# Patient Record
Sex: Female | Born: 1935
Health system: Southern US, Community
[De-identification: ages and names within clinical notes are randomized; demographics above are authoritative.]

## PROBLEM LIST (undated history)

## (undated) DIAGNOSIS — M109 Gout, unspecified: Secondary | ICD-10-CM

## (undated) DIAGNOSIS — J40 Bronchitis, not specified as acute or chronic: Secondary | ICD-10-CM

## (undated) DIAGNOSIS — M1990 Unspecified osteoarthritis, unspecified site: Secondary | ICD-10-CM

## (undated) HISTORY — PX: SALPINGECTOMY: SHX328

---

## 1997-11-01 ENCOUNTER — Encounter: Admission: RE | Admit: 1997-11-01 | Discharge: 1997-11-01 | Payer: Self-pay | Admitting: Family Medicine

## 1997-11-27 ENCOUNTER — Encounter: Admission: RE | Admit: 1997-11-27 | Discharge: 1997-11-27 | Payer: Self-pay | Admitting: Family Medicine

## 1998-01-17 ENCOUNTER — Encounter: Admission: RE | Admit: 1998-01-17 | Discharge: 1998-01-17 | Payer: Self-pay | Admitting: Family Medicine

## 1998-05-17 ENCOUNTER — Encounter: Admission: RE | Admit: 1998-05-17 | Discharge: 1998-05-17 | Payer: Self-pay | Admitting: Family Medicine

## 1998-06-12 ENCOUNTER — Encounter: Admission: RE | Admit: 1998-06-12 | Discharge: 1998-06-12 | Payer: Self-pay | Admitting: Family Medicine

## 1998-07-12 ENCOUNTER — Encounter: Admission: RE | Admit: 1998-07-12 | Discharge: 1998-07-12 | Payer: Self-pay | Admitting: Family Medicine

## 1998-07-23 ENCOUNTER — Encounter: Admission: RE | Admit: 1998-07-23 | Discharge: 1998-10-21 | Payer: Self-pay | Admitting: *Deleted

## 1999-03-05 ENCOUNTER — Encounter: Admission: RE | Admit: 1999-03-05 | Discharge: 1999-03-05 | Payer: Self-pay | Admitting: Family Medicine

## 1999-03-22 ENCOUNTER — Encounter: Admission: RE | Admit: 1999-03-22 | Discharge: 1999-03-22 | Payer: Self-pay | Admitting: Family Medicine

## 1999-04-10 ENCOUNTER — Encounter: Admission: RE | Admit: 1999-04-10 | Discharge: 1999-04-10 | Payer: Self-pay | Admitting: Family Medicine

## 2000-01-13 ENCOUNTER — Encounter: Admission: RE | Admit: 2000-01-13 | Discharge: 2000-01-13 | Payer: Self-pay | Admitting: Family Medicine

## 2000-03-25 ENCOUNTER — Encounter: Admission: RE | Admit: 2000-03-25 | Discharge: 2000-03-25 | Payer: Self-pay | Admitting: Family Medicine

## 2000-09-24 ENCOUNTER — Encounter: Admission: RE | Admit: 2000-09-24 | Discharge: 2000-09-24 | Payer: Self-pay | Admitting: Sports Medicine

## 2000-11-18 ENCOUNTER — Encounter: Admission: RE | Admit: 2000-11-18 | Discharge: 2000-11-18 | Payer: Self-pay | Admitting: Family Medicine

## 2001-04-13 ENCOUNTER — Encounter (INDEPENDENT_AMBULATORY_CARE_PROVIDER_SITE_OTHER): Payer: Self-pay | Admitting: *Deleted

## 2001-04-13 LAB — CONVERTED CEMR LAB

## 2001-04-22 ENCOUNTER — Encounter: Admission: RE | Admit: 2001-04-22 | Discharge: 2001-04-22 | Payer: Self-pay | Admitting: Family Medicine

## 2001-05-07 ENCOUNTER — Emergency Department (HOSPITAL_COMMUNITY): Admission: EM | Admit: 2001-05-07 | Discharge: 2001-05-07 | Payer: Self-pay | Admitting: Emergency Medicine

## 2001-05-07 ENCOUNTER — Encounter: Payer: Self-pay | Admitting: Gastroenterology

## 2001-12-06 ENCOUNTER — Encounter: Admission: RE | Admit: 2001-12-06 | Discharge: 2001-12-06 | Payer: Self-pay | Admitting: Sports Medicine

## 2002-01-03 ENCOUNTER — Encounter: Admission: RE | Admit: 2002-01-03 | Discharge: 2002-01-03 | Payer: Self-pay | Admitting: Family Medicine

## 2002-01-10 ENCOUNTER — Encounter: Payer: Self-pay | Admitting: Sports Medicine

## 2002-01-10 ENCOUNTER — Encounter: Admission: RE | Admit: 2002-01-10 | Discharge: 2002-01-10 | Payer: Self-pay | Admitting: Sports Medicine

## 2002-01-17 ENCOUNTER — Encounter: Admission: RE | Admit: 2002-01-17 | Discharge: 2002-01-17 | Payer: Self-pay | Admitting: Sports Medicine

## 2002-01-25 ENCOUNTER — Encounter: Admission: RE | Admit: 2002-01-25 | Discharge: 2002-01-25 | Payer: Self-pay | Admitting: Family Medicine

## 2002-06-17 ENCOUNTER — Encounter: Admission: RE | Admit: 2002-06-17 | Discharge: 2002-06-17 | Payer: Self-pay | Admitting: Family Medicine

## 2002-10-07 ENCOUNTER — Encounter: Payer: Self-pay | Admitting: Nephrology

## 2002-10-07 ENCOUNTER — Encounter: Admission: RE | Admit: 2002-10-07 | Discharge: 2002-10-07 | Payer: Self-pay | Admitting: Nephrology

## 2002-11-28 ENCOUNTER — Encounter: Payer: Self-pay | Admitting: Nephrology

## 2002-11-28 ENCOUNTER — Encounter: Admission: RE | Admit: 2002-11-28 | Discharge: 2002-11-28 | Payer: Self-pay | Admitting: Nephrology

## 2003-03-01 ENCOUNTER — Encounter: Admission: RE | Admit: 2003-03-01 | Discharge: 2003-03-01 | Payer: Self-pay | Admitting: Family Medicine

## 2003-05-29 ENCOUNTER — Encounter: Admission: RE | Admit: 2003-05-29 | Discharge: 2003-05-29 | Payer: Self-pay | Admitting: Family Medicine

## 2006-09-10 DIAGNOSIS — K2100 Gastro-esophageal reflux disease with esophagitis, without bleeding: Secondary | ICD-10-CM | POA: Insufficient documentation

## 2006-09-10 DIAGNOSIS — I10 Essential (primary) hypertension: Secondary | ICD-10-CM | POA: Insufficient documentation

## 2006-09-10 DIAGNOSIS — M1990 Unspecified osteoarthritis, unspecified site: Secondary | ICD-10-CM | POA: Insufficient documentation

## 2006-09-10 DIAGNOSIS — M129 Arthropathy, unspecified: Secondary | ICD-10-CM | POA: Insufficient documentation

## 2006-09-10 DIAGNOSIS — F411 Generalized anxiety disorder: Secondary | ICD-10-CM | POA: Insufficient documentation

## 2006-09-10 DIAGNOSIS — J4520 Mild intermittent asthma, uncomplicated: Secondary | ICD-10-CM | POA: Insufficient documentation

## 2006-09-10 DIAGNOSIS — F339 Major depressive disorder, recurrent, unspecified: Secondary | ICD-10-CM | POA: Insufficient documentation

## 2006-09-10 DIAGNOSIS — M109 Gout, unspecified: Secondary | ICD-10-CM | POA: Insufficient documentation

## 2006-09-10 DIAGNOSIS — Z87891 Personal history of nicotine dependence: Secondary | ICD-10-CM | POA: Insufficient documentation

## 2006-09-11 ENCOUNTER — Encounter (INDEPENDENT_AMBULATORY_CARE_PROVIDER_SITE_OTHER): Payer: Self-pay | Admitting: *Deleted

## 2008-04-14 ENCOUNTER — Emergency Department (HOSPITAL_COMMUNITY): Admission: EM | Admit: 2008-04-14 | Discharge: 2008-04-14 | Payer: Self-pay | Admitting: Family Medicine

## 2009-07-20 ENCOUNTER — Encounter (INDEPENDENT_AMBULATORY_CARE_PROVIDER_SITE_OTHER): Payer: Self-pay | Admitting: *Deleted

## 2009-08-03 ENCOUNTER — Encounter (INDEPENDENT_AMBULATORY_CARE_PROVIDER_SITE_OTHER): Payer: Self-pay | Admitting: *Deleted

## 2009-08-03 ENCOUNTER — Telehealth: Payer: Self-pay | Admitting: Gastroenterology

## 2009-08-03 ENCOUNTER — Ambulatory Visit: Payer: Self-pay | Admitting: Gastroenterology

## 2009-08-03 DIAGNOSIS — K219 Gastro-esophageal reflux disease without esophagitis: Secondary | ICD-10-CM | POA: Insufficient documentation

## 2009-08-03 DIAGNOSIS — R1310 Dysphagia, unspecified: Secondary | ICD-10-CM | POA: Insufficient documentation

## 2009-08-03 LAB — CONVERTED CEMR LAB
Albumin: 4.3 g/dL (ref 3.5–5.2)
Alkaline Phosphatase: 46 units/L (ref 39–117)
Basophils Relative: 0.6 % (ref 0.0–3.0)
CO2: 34 meq/L — ABNORMAL HIGH (ref 19–32)
Chloride: 103 meq/L (ref 96–112)
Eosinophils Absolute: 0.3 10*3/uL (ref 0.0–0.7)
Ferritin: 66.1 ng/mL (ref 10.0–291.0)
HCT: 38.1 % (ref 36.0–46.0)
Hemoglobin: 12.7 g/dL (ref 12.0–15.0)
Lymphocytes Relative: 31.3 % (ref 12.0–46.0)
Lymphs Abs: 1.6 10*3/uL (ref 0.7–4.0)
MCHC: 33.3 g/dL (ref 30.0–36.0)
MCV: 94.8 fL (ref 78.0–100.0)
Monocytes Absolute: 0.4 10*3/uL (ref 0.1–1.0)
Neutro Abs: 2.7 10*3/uL (ref 1.4–7.7)
RBC: 4.02 M/uL (ref 3.87–5.11)
Saturation Ratios: 28 % (ref 20.0–50.0)
Sodium: 144 meq/L (ref 135–145)
Total Protein: 7.4 g/dL (ref 6.0–8.3)

## 2009-08-08 ENCOUNTER — Telehealth: Payer: Self-pay | Admitting: Gastroenterology

## 2009-08-14 ENCOUNTER — Telehealth: Payer: Self-pay | Admitting: Gastroenterology

## 2009-08-14 ENCOUNTER — Emergency Department (HOSPITAL_COMMUNITY): Admission: EM | Admit: 2009-08-14 | Discharge: 2009-08-14 | Payer: Self-pay | Admitting: Emergency Medicine

## 2009-08-15 ENCOUNTER — Ambulatory Visit: Payer: Self-pay | Admitting: Gastroenterology

## 2009-10-24 ENCOUNTER — Telehealth: Payer: Self-pay | Admitting: Gastroenterology

## 2009-10-25 ENCOUNTER — Telehealth: Payer: Self-pay | Admitting: Gastroenterology

## 2009-10-29 ENCOUNTER — Encounter: Payer: Self-pay | Admitting: Gastroenterology

## 2010-03-03 ENCOUNTER — Emergency Department (HOSPITAL_COMMUNITY): Admission: EM | Admit: 2010-03-03 | Discharge: 2010-03-03 | Payer: Self-pay | Admitting: Emergency Medicine

## 2010-03-15 ENCOUNTER — Emergency Department (HOSPITAL_COMMUNITY): Admission: EM | Admit: 2010-03-15 | Discharge: 2010-03-15 | Payer: Self-pay | Admitting: Emergency Medicine

## 2010-06-11 ENCOUNTER — Telehealth: Payer: Self-pay | Admitting: Gastroenterology

## 2010-08-13 NOTE — Letter (Signed)
Summary: EGD Instructions  [facility]  [address] [place], [place] [address]   Phone: [phone]  Fax: [phone]       MCKENZIE BOVE    30-Nov-1929    MRN: [id number]       Procedure Day Dorna Bloom: Wednesday, 08/15/09     Arrival Time:  1:00     Procedure Time: 2:00     Location of Procedure:                    _X  _ [facility] (4th Floor)    PREPARATION FOR ENDOSCOPY   On 08/15/09 THE DAY OF THE PROCEDURE:  1.   No solid foods, milk or milk products are allowed after midnight the night before your procedure.  2.   Do not drink anything colored red or purple.  Avoid juices with pulp.  No orange juice.  3.  You may drink clear liquids until 12:00, which is 2 hours before your procedure.                                                                                                CLEAR LIQUIDS INCLUDE: Water Jello Ice Popsicles Tea (sugar ok, no milk/cream) Powdered fruit flavored drinks Coffee (sugar ok, no milk/cream) Gatorade Juice: apple, white grape, white cranberry  Lemonade Clear bullion, consomm, broth Carbonated beverages (any kind) Strained chicken noodle soup Hard Candy   MEDICATION INSTRUCTIONS  Unless otherwise instructed, you should take regular prescription medications with a small sip of water as early as possible the morning of your procedure.                    OTHER INSTRUCTIONS  You will need a responsible adult at least 75 years of age to accompany you and drive you home.   This person must remain in the waiting room during your procedure.  Wear loose fitting clothing that is easily removed.  Leave jewelry and other valuables at home.  However, you may wish to bring a book to read or an iPod/MP3 player to listen to music as you wait for your procedure to start.  Remove all body piercing jewelry and leave at home.  Total time from sign-in until discharge is approximately 2-3 hours.  You should go  home directly after your procedure and rest.  You can resume normal activities the day after your procedure.  The day of your procedure you should not:   Drive   Make legal decisions   Operate machinery   Drink alcohol   Return to work  You will receive specific instructions about eating, activities and medications before you leave.    The above instructions have been reviewed and explained to me by   _______________________    I fully understand and can verbalize these instructions _____________________________ Date _________

## 2010-08-13 NOTE — Medication Information (Signed)
Summary: Aciphex Denied / [facility]  Aciphex Denied / [facility]   Imported By: Lennie Odor 11/05/2009 10:49:01  _____________________________________________________________________  External Attachment:    Type:   Image     Comment:   External Document

## 2010-08-13 NOTE — Progress Notes (Signed)
Summary: Medication  Phone Note Call from Patient Call back at Home Phone [phone]   Caller: Patient Call For: Dr. Jarold Motto Reason for Call: Talk to Nurse Summary of Call: Wants to know if her OMEPRAZOLE can be switched to some other med. It is causing constipation Initial call taken by: Karna Christmas,  June 11, 2010 12:08 PM  Follow-up for Phone Call        switched to Nexium  Follow-up by: Harlow Mares CMA Duncan Dull),  June 11, 2010 12:34 PM    New/Updated Medications: NEXIUM 20 MG PACK (ESOMEPRAZOLE MAGNESIUM) take one by mouth once daily Prescriptions: NEXIUM 20 MG PACK (ESOMEPRAZOLE MAGNESIUM) take one by mouth once daily  #30 x 6   Entered by:   Harlow Mares CMA (AAMA)   Authorized by:   Mardella Layman MD [facility]   Signed by:   Harlow Mares CMA (AAMA) on 06/11/2010   Method used:   [facility]  [address] [id number]       [address]       [place], [place]  [address]       Ph: [phone] or [phone]       Fax: [phone]   RxID:   [id number]

## 2010-08-13 NOTE — Progress Notes (Signed)
Summary: Ins will not cover meds  Phone Note Call from Patient Call back at [phone]   Caller: Paula-daughter [phone] Call For: Dr Jarold Motto Summary of Call: Medicare will not cover her medicines. Initial call taken by: Leanor Kail [facility],  August 03, 2009 1:10 PM  Follow-up for Phone Call        Medicare will not cover Tussionex.   It will cost pt over $100.  Can this be changed? Follow-up by: Ashok Cordia RN,  August 03, 2009 1:44 PM    Additional Follow-up for Phone Call Additional follow up Details #2::    codeine elixir...45m/5cc.Marland KitchenMarland Kitchen# 6oxz...use chs as needed cough    Follow-up by: Mardella Layman MD [facility],  August 03, 2009 2:26 PM  Additional Follow-up for Phone Call Additional follow up Details #3:: Details for Additional Follow-up Action Taken: Per pharmacist Robitussin with codeine is available and will be much less expensive for pt.  Ok per Dr. Jarold Motto.  Dtr notified.    New/Updated Medications: * ROBITUSSIN WITH CODEINE 1-2 tsp q hs as needed cough Prescriptions: ROBITUSSIN WITH CODEINE 1-2 tsp q hs as needed cough  #6 oz x 1   Entered by:   Ashok Cordia RN   Authorized by:   Mardella Layman MD [facility]   Signed by:   Ashok Cordia RN on 08/03/2009   Method used:   Printed then [facility]ed [facility] ...       [facility]  [address] [id number]       [address]       [place], [place]  [address]       Ph: [phone] or [phone]       Fax: [phone]   RxID:   [id number]

## 2010-08-13 NOTE — Progress Notes (Signed)
Summary: Triage  Phone Note Call from Patient Call back at Home Phone [phone]   Caller: Patient Call For: Dr. Jarold Motto Reason for Call: Talk to Nurse Summary of Call: Feels like the omeprazole is not working. Would like something else called in...tablet form...[facility] [address] Initial call taken by: Karna Christmas,  October 24, 2009 10:26 AM  Follow-up for Phone Call        Pt states that she is having a problem with the omeprazole because it is a capsule.  States she has never been able to take capsules.  It is causing her stomach to hurt.  Asking if there is another med that comes in a tablet form that would work for her.  Talked with pharmacsit.  Pt has tried pantoprozole (given by Dr. Delton See)  Pt states this did not help symptoms. Only other PPi that is a tablet is aciphex.  It is not covered under her insurance plan. Follow-up by: Ashok Cordia RN,  October 24, 2009 10:50 AM  Additional Follow-up for Phone Call Additional follow up Details #1::        LM for pt to call.  Lupita Leash Surface RN  October 24, 2009 2:02 PM  LM for pt to call.   Lupita Leash Surface RN  October 25, 2009 2:55 PM  Pt would like [place] to see if we can get aciphex approved.  She will use OTC prilosec for now. Rx will be sent to pharmacy and we will wait for prior auth information. Additional Follow-up by: Ashok Cordia RN,  October 25, 2009 3:06 PM    New/Updated Medications: ACIPHEX 20 MG  TBEC (RABEPRAZOLE SODIUM) Take 1 each day 30 minutes before meals Prescriptions: ACIPHEX 20 MG  TBEC (RABEPRAZOLE SODIUM) Take 1 each day 30 minutes before meals  #30 x 6   Entered by:   Ashok Cordia RN   Authorized by:   Mardella Layman MD [facility]   Signed by:   Ashok Cordia RN on 10/25/2009   Method used:   [facility]  [address] [id number]       [address]       [place], [place]  [address]       Ph: [phone] or [phone]       Fax: [phone]   RxID:    [id number]

## 2010-08-13 NOTE — Miscellaneous (Signed)
Summary: Omeprazole rx  Clinical Lists Changes  Medications: Added new medication of OMEPRAZOLE 40 MG  CPDR (OMEPRAZOLE) 1 each day 30 minutes before meal - Signed Rx of OMEPRAZOLE 40 MG  CPDR (OMEPRAZOLE) 1 each day 30 minutes before meal;  #30 x 6;  Signed;  Entered by: Karl Bales RN;  Authorized by: Mardella Layman MD [facility];  Method used[facility]  [address] [id number] [address] [place], [place], [place]  [address], Ph: [phone] or [phone], Fax: [phone]    Prescriptions: OMEPRAZOLE 40 MG  CPDR (OMEPRAZOLE) 1 each day 30 minutes before meal  #30 x 6   Entered by:   Karl Bales RN   Authorized by:   Mardella Layman MD [facility]   Signed by:   Karl Bales RN on 08/15/2009   Method used:   [facility]  [address] [id number]       [address]       [place], [place]  [address]       Ph: [phone] or [phone]       Fax: [phone]   RxID:   [id number]

## 2010-08-13 NOTE — Progress Notes (Signed)
Summary: coughing/EGD  Phone Note Call from Patient Call back at [phone]  (daughter's cell)   Caller: daughter, Gunnar Fusi Call For: Dr. Jarold Motto Reason for Call: Talk to Nurse Summary of Call: pt still has frequent coughing... pt didnt sleep well last night due to coughing... pt has EGD tomorrow and daughter concerned, wants to know if it should be rescheduled Initial call taken by: Vallarie Mare,  August 14, 2009 8:06 AM  Follow-up for Phone Call        Caregiver called dtr this am stating that pt had coughed all night,  SOB,  At one point could not speak.  Dtr is going to check on pt.  May need to take pt to be checked by PCP.  Dt will let us know what is going on with pt after she checks on her. Follow-up by: Ashok Cordia RN,  August 14, 2009 8:27 AM  Additional Follow-up for Phone Call Additional follow up Details #1::        Dtr, Gunnar Fusi calling to report.  PT was seen by PCP this am.  Given breathing tx.  Had EKG, Xray done which were normal.  No temp.  Per MD pt OK to go ahead and have proc done tomorrow. Additional Follow-up by: Ashok Cordia RN,  August 14, 2009 1:59 PM

## 2010-08-13 NOTE — Progress Notes (Signed)
Summary: med/ins  Phone Note From Pharmacy Call back at [phone]   Caller: Marcelino Duster, pharmacist Call For: Dr. Jarold Motto  Reason for Call: Medication not on formulary Summary of Call: Aciphex isnt covered by pt ins Initial call taken by: Vallarie Mare,  October 25, 2009 3:41 PM  Follow-up for Phone Call        [facility]  [facility] # -[id number],   Phone # for insurance durg coverage is [phone].  Pt can not take capsules and has tried protonix and omeprazole.  Both caused abd pain.  Will try to get aciphex approved.  Follow-up by: Ashok Cordia RN,  October 26, 2009 4:38 PM  Additional Follow-up for Phone Call Additional follow up Details #1::        Getting authorization from insurer. Spoke to Maralyn Sago and we did  the prior authorization for Aciphex over the phone. Maralyn Sago stated that the approval or denial will be faxed to [place] and could take two weeks to hear back. Will contact the patient  Additional Follow-up by: Ok Anis CMA,  October 29, 2009 9:46 AM    Additional Follow-up for Phone Call Additional follow up Details #2::    Patient insurance denied patients request for Aciphex. Insurance company stated patient needs to take Dexilant, Nexium, generic Zegerid or Prevacid. I called patients pharmacy to see if patients insurance will cover Lansoprazole solutabs but that is not covered by patients insurance.  Patient needs to take over the counter Prilosec two times a day and Lupita Leash already talked to patient about this. I called patient at home number and left a message for her to call us back reguarding her Rx. Follow-up by: Ok Anis CMA,  October 29, 2009 4:29 PM

## 2010-08-13 NOTE — Letter (Signed)
Summary: New Patient letter  [facility]  [address] [place], [place] [address]   Phone: [phone]  Fax: [phone]       07/20/2009 MRN: [id number]  [facility] [address] [place], [place]  [address]  Dear Ms. Heming,  Welcome to the Gastroenterology Division at [facility].    You are scheduled to see Dr.  Jarold Motto on 08/03/2009 at 9:30am on the 3rd floor at [facility], [address]  We ask that you try to arrive at our office 15 minutes prior to your appointment time to allow for check-in.  We would like you to complete the enclosed self-administered evaluation form prior to your visit and bring it with you on the day of your appointment.  We will review it with you.  Also, please bring a complete list of all your medications or, if you prefer, bring the medication bottles and we will list them.  Please bring your insurance card so that we may make a copy of it.  If your insurance requires a referral to see a specialist, please bring your referral form from your primary care physician.  Co-payments are due at the time of your visit and may be paid by cash, check or credit card.     Your office visit will consist of a consult with your physician (includes a physical exam), any laboratory testing he/she may order, scheduling of any necessary diagnostic testing (e.g. x-ray, ultrasound, CT-scan), and scheduling of a procedure (e.g. Endoscopy, Colonoscopy) if required.  Please allow enough time on your schedule to allow for any/all of these possibilities.    If you cannot keep your appointment, please call [phone] to cancel or reschedule prior to your appointment date.  This allows [place] the opportunity to schedule an appointment for another patient in need of care.  If you do not cancel or reschedule by 5 p.m. the business day prior to your appointment date, you will be charged a $50.00 late cancellation/no-show fee.    Thank you for choosing  [facility] for your medical needs.  We appreciate the opportunity to care for you.  Please visit [place] at our website  to learn more about our practice.                     Sincerely,                                                             The Gastroenterology Division

## 2010-08-13 NOTE — Progress Notes (Signed)
Summary: labwork  Phone Note Call from Patient Call back at Home Phone [phone]   Caller: Patient Call For: Dr. Jarold Motto Reason for Call: Lab or Test Results Summary of Call: pt would like to know her labwork results Initial call taken by: Vallarie Mare,  August 08, 2009 3:00 PM  Follow-up for Phone Call        Pt notified.  Follow-up by: Ashok Cordia RN,  August 08, 2009 3:22 PM

## 2010-08-13 NOTE — Miscellaneous (Signed)
Summary: Orders Update/clotest  Clinical Lists Changes  Orders: Added new Test order of TLB-H Pylori Screen Gastric Biopsy (83013-CLOTEST) - Signed 

## 2010-08-13 NOTE — Procedures (Signed)
Summary: Upper Endoscopy  Patient: Amy Gallagher Note: All result statuses are Final unless otherwise noted.  Tests: (1) Upper Endoscopy (EGD)   EGD Upper Endoscopy       DONE     [facility]     [address]     [place], [place]  [address]           ENDOSCOPY PROCEDURE REPORT           PATIENT:  Amy, Gallagher  MR#:  [id number]     BIRTHDATE:  Apr 17, 1930, 79 yrs. old  GENDER:  female           ENDOSCOPIST:  Vania Rea. Jarold Motto, MD, [facility]     Referred by:           PROCEDURE DATE:  08/15/2009     PROCEDURE:  EGD with biopsy, Elease Hashimoto Dilation of Esophagus     ASA CLASS:  Class II     INDICATIONS:  GERD, dysphagia           MEDICATIONS:   Fentanyl 50 mcg IV, Versed 6 mg IV     TOPICAL ANESTHETIC:  Exactacain Spray           DESCRIPTION OF PROCEDURE:   After the risks benefits and     alternatives of the procedure were thoroughly explained, informed     consent was obtained.  The LB [id number]-H180 [id number] endoscope was     introduced through the mouth and advanced to the second portion of     the duodenum, without limitations.  The instrument was slowly     withdrawn as the mucosa was fully examined.     <<PROCEDUREIMAGES>>           Mild gastritis was found in the antrum. clo bx. done,,,     Esophagitis was found in the distal esophagus. MARKED ERYTHEMA AND     EARLY STRICTURE NOTED AND A 3CM HH NOTED.DILATED #50 F MALONEY     DILATOR.  Normal duodenal folds were noted.  Otherwise the     examination was normal. VOCAL CORDS APPEAR NORMAL.    Retroflexed     views revealed a hiatal hernia.    The scope was then withdrawn     from the patient and the procedure completed.           COMPLICATIONS:  None           ENDOSCOPIC IMPRESSION:     1) Mild gastritis in the antrum     2) Esophagitis in the distal esophagus     3) Normal duodenal folds     4) Otherwise normal examination     5) A hiatal hernia     CHRONIC GERD AND SECONDARY STRICTURE DILATED.      RECOMMENDATIONS:     1) continue current medications     2) post dilation instructions     3) Rx CLO if positive           REPEAT EXAM:  No           ______________________________     Vania Rea. Jarold Motto, MD, [facility]           CC:  Abbe Amsterdam, MD           n.     Rosalie DoctorVania Rea. Jakim Drapeau at 08/15/2009 01:53 PM           Lovie Chol, [phone]  Note: An exclamation mark (!) indicates a  result that was not dispersed into the flowsheet. Document Creation Date: 08/15/2009 1:53 PM _______________________________________________________________________  (1) Order result status: Final Collection or observation date-time: 08/15/2009 13:45 Requested date-time:  Receipt date-time:  Reported date-time:  Referring Physician:   Ordering Physician: Sheryn Bison [id number]) Specimen Source:  Source: Launa Grill Order Number: [id number] Lab site:

## 2010-08-13 NOTE — Assessment & Plan Note (Signed)
Summary: cough, previous food impaction/lk   History of Present Illness Visit Type: new patient  Primary GI MD: Sheryn Bison MD FACP FAGA Primary Provider: Gwenlyn Found. Copland, MD  Requesting Provider: n/a Chief Complaint: Acid reflux, and heartburn. Consult EGD  History of Present Illness:   This patient is a 77 year old African American female brought to the office today by her daughter who is a patient of mine. The patient has severe degenerative arthritis and is on daily naproxen but denies abdominal pain or any GI complaints except for intermittent solid food dysphagia. She apparently had a meat impaction 5 years ago and underwent emergent endoscopy by Dr. Arlyce Dice . Since that time she has been on daily PPI therapy and has done fairly well without burning chest pain, dyspepsia, or abdominal pain. She denies any hepatobiliary complaints or lower gastrointestinal difficulties. Patient allegedly has had negative Hemoccult cards and is not anemic. She is followed by Dr. Warner Mccreedy. Patient repeatedly has refused colonoscopy exam. She does have asthmatic bronchitis and is on inhalers and also has severe nocturnal coughing.   GI Review of Systems    Reports acid reflux and  heartburn.      Denies abdominal pain, belching, bloating, chest pain, dysphagia with liquids, dysphagia with solids, loss of appetite, nausea, vomiting, vomiting blood, weight loss, and  weight gain.        Denies anal fissure, black tarry stools, change in bowel habit, constipation, diarrhea, diverticulosis, fecal incontinence, heme positive stool, hemorrhoids, irritable bowel syndrome, jaundice, light color stool, liver problems, rectal bleeding, and  rectal pain.    Current Medications (verified): 1)  Allegra 60 Mg Tabs (Fexofenadine Hcl) .... One Tablet By Mouth Once Daily 2)  Albuterol Sulfate (2.5 Mg/97ml) 0.083% Nebu (Albuterol Sulfate) .... As Needed 3)  Flonase 50 Mcg/act Susp (Fluticasone Propionate) ....  As Needed 4)  Naproxen 500 Mg Tabs (Naproxen) .... One Tablet By Mouth Once Daily 5)  Protonix 40 Mg Tbec (Pantoprazole Sodium) .... One Tablet By Mouth As Needed 6)  Lisinopril-Hydrochlorothiazide 20-25 Mg Tabs (Lisinopril-Hydrochlorothiazide) .... One Tablet By Mouth Once Daily  Allergies (verified): 1)  ! Aspirin 2)  ! Penicillin  Past History:  Past medical, surgical, family and social histories (including risk factors) reviewed for relevance to current acute and chronic problems.  Past Medical History: Reviewed history from 09/10/2006 and no changes required. injections bilateral knees 5/03  Past Surgical History: Hemorrhoidectomy  Family History: Reviewed history and no changes required. No FH of Colon Cancer:  Social History: Reviewed history from 09/10/2006 and no changes required. 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  Alcohol Use - no Illicit Drug Use - no Smoking Status:  quit Drug Use:  no  Review of Systems       The patient complains of allergy/sinus, arthritis/joint pain, cough, muscle pains/cramps, and voice change.    Vital Signs:  Patient profile:   77 year old female Height:      63 inches Pulse rate:   88 / minute Pulse rhythm:   regular BP sitting:   134 / 76  (left arm) Cuff size:   regular  Vitals Entered By: Ok Anis CMA (August 03, 2009 9:30 AM)  Physical Exam  General:  Well developed, well nourished, no acute distress.healthy appearing.   Head:  Normocephalic and atraumatic. Eyes:  PERRLA, no icterus.exam deferred to patient's ophthalmologist.   Neck:  Supple; no masses or thyromegaly. Lungs:  bilateral wheezes and rhonchi noted without areas  of consolidation. Heart:  Regular rate and rhythm; no murmurs, rubs,  or bruits. Abdomen:  Soft, nontender and nondistended. No masses, hepatosplenomegaly or hernias noted. Normal bowel sounds. Msk:  decreased ROM and arthritic changes.   Extremities:  No  clubbing, cyanosis, edema or deformities noted.trace pedal edema.   Neurologic:  Alert and  oriented x4;  grossly normal neurologically. Cervical Nodes:  No significant cervical adenopathy. Inguinal Nodes:  No significant inguinal adenopathy. Psych:  Alert and cooperative. Normal mood and affect.   Impression & Recommendations:  Problem # 1:  ESOPHAGEAL REFLUX (ICD-530.81) Assessment Improved Continue reflex regime and daily PPI therapy. Orders: TLB-BMP (Basic Metabolic Panel-BMET) (80048-METABOL) TLB-CBC Platelet - w/Differential (85025-CBCD) TLB-Hepatic/Liver Function Pnl (80076-HEPATIC) TLB-TSH (Thyroid Stimulating Hormone) (84443-TSH) TLB-B12, Serum-Total ONLY (62831-D17) TLB-Ferritin (82728-FER) TLB-Folic Acid (Folate) (82746-FOL) TLB-IBC Pnl (Iron/FE;Transferrin) (83550-IBC)  Problem # 2:  DYSPHAGIA UNSPECIFIED (ICD-787.20) Assessment: Deteriorated Probable peptic esophageal stricture. Endoscopy and dilation had been scheduled and reviewed with the patient and her daughter including the risk and benefits and alternative ways to visualize the esophagus. Orders: TLB-BMP (Basic Metabolic Panel-BMET) (80048-METABOL) TLB-CBC Platelet - w/Differential (85025-CBCD) TLB-Hepatic/Liver Function Pnl (80076-HEPATIC) TLB-TSH (Thyroid Stimulating Hormone) (84443-TSH) TLB-B12, Serum-Total ONLY (61607-P71) TLB-Ferritin (82728-FER) TLB-Folic Acid (Folate) (82746-FOL) TLB-IBC Pnl (Iron/FE;Transferrin) (83550-IBC) EGD (EGD)  Problem # 3:  OSTEOARTHRITIS, LOWER LEG (ICD-715.96) Assessment: Unchanged Apparently she is followed closely by orthopedics and uses standard doses of NSAIDs. An endoscopy we will be sure that she does not have an NSAID-induced ulceration or H. pylori infection.  Problem # 4:  HYPERTENSION, BENIGN SYSTEMIC (ICD-401.1) Assessment: Improved blood pressure today is normal at 134/76 and have asked to continue her lisinopril-HCTZ. She does have a history of epigastric  distress with aspirin use and also a true penicillin allergy.  Problem # 5:  DEPRESSION, MAJOR, RECURRENT (ICD-296.30) Assessment: Improved I cannot see where she is on antidepressant therapy at this time  Problem # 6:  ASTHMA, UNSPECIFIED (ICD-493.90) Assessment: Deteriorated Probably Related to continued acid reflux problems. I have increased her PPI to twice a day therapy and we'll let her try some Tussionex p.r.n. at bedtime. Labs have been ordered for review.  Problem # 7:  SCREENING COLORECTAL-CANCER (ICD-V76.51) Assessment: Unchanged She routinely has refused colonoscopy exam. After endoscopy we'll do the HemeSelect cards which are more accurate for detecting occult blood. Also labs have been ordered include an anemia profile.  Weight was not documented today as patient unable to stand; BMI could not be calculated.  Patient Instructions: 1)  Copy sent to : Dr. Warner Mccreedy 2)  Please continue current medications.  3)  Avoid foods high in acid content ( tomatoes, citrus juices, spicy foods) . Avoid eating within 3 to 4 hours of lying down or before exercising. Do not over eat; try smaller more frequent meals. Elevate head of bed four inches when sleeping.  4)  Diet should be high in fiber ( fruits, vegetables, whole grains) but low in residue. Drink at least eight (8) glasses of water a day.  5)  Conscious Sedation brochure given.  6)  Upper Endoscopy with Dilatation brochure given.  7)  Increase Protonix to 40 mg twice a day 8)  Labs pending 9)  Rx faxed to pharmacy for Tussionex. 10)  The medication list was reviewed and reconciled.  All changed / newly prescribed medications were explained.  A complete medication list was provided to the patient / caregiver. Prescriptions: TUSSIONEX PENNKINETIC ER 8-10 MG/5ML LQCR (CHLORPHENIRAMINE-HYDROCODONE) 1-2 tsp q hs as needed  cough  #8 oz x 0   Entered by:   Ashok Cordia RN   Authorized by:   Mardella Layman MD [facility]   Signed by:    Ashok Cordia RN on 08/03/2009   Method used:   Printed then faxed to ...       [facility]  [address] [id number]       [address]       [place], [place]  [address]       Ph: [phone] or [phone]       Fax: [phone]   RxID:   [id number]

## 2010-10-03 LAB — POCT CARDIAC MARKERS
CKMB, poc: 2.8 ng/mL (ref 1.0–8.0)
Myoglobin, poc: 85.1 ng/mL (ref 12–200)

## 2011-04-06 ENCOUNTER — Other Ambulatory Visit: Payer: Self-pay | Admitting: Gastroenterology

## 2011-04-08 ENCOUNTER — Other Ambulatory Visit: Payer: Self-pay | Admitting: Gastroenterology

## 2011-08-13 ENCOUNTER — Encounter: Payer: Self-pay | Admitting: Physician Assistant

## 2011-08-13 NOTE — Telephone Encounter (Signed)
This encounter was created in error - please disregard.

## 2011-08-14 ENCOUNTER — Encounter: Payer: Self-pay | Admitting: Family Medicine

## 2011-08-14 ENCOUNTER — Other Ambulatory Visit: Payer: Self-pay | Admitting: Family Medicine

## 2011-08-14 DIAGNOSIS — Z9109 Other allergy status, other than to drugs and biological substances: Secondary | ICD-10-CM | POA: Insufficient documentation

## 2011-08-14 MED ORDER — FLUTICASONE PROPIONATE 50 MCG/ACT NA SUSP: 2.0000 | Freq: Every day | NASAL | Status: DC

## 2011-08-14 MED ORDER — ALBUTEROL SULFATE HFA 108 (90 BASE) MCG/ACT IN AERS: 2.0000 | INHALATION_SPRAY | Freq: Four times a day (QID) | RESPIRATORY_TRACT | Status: DC | PRN

## 2011-08-14 MED ORDER — ALPRAZOLAM 0.25 MG PO TABS: 0.1250 mg | ORAL_TABLET | Freq: Two times a day (BID) | ORAL | Status: AC | PRN

## 2011-08-14 MED ORDER — TRAMADOL HCL 50 MG PO TABS: 50.0000 mg | ORAL_TABLET | Freq: Every day | ORAL | Status: AC | PRN

## 2011-08-14 MED ORDER — PANTOPRAZOLE SODIUM 40 MG PO TBEC: 40.0000 mg | DELAYED_RELEASE_TABLET | Freq: Every day | ORAL | Status: DC

## 2011-08-14 MED ORDER — FEXOFENADINE HCL 180 MG PO TABS: 180.0000 mg | ORAL_TABLET | Freq: Every day | ORAL | Status: DC

## 2011-08-21 ENCOUNTER — Other Ambulatory Visit: Payer: Self-pay | Admitting: Family Medicine

## 2011-08-24 NOTE — Telephone Encounter (Signed)
Please pull this chart and return to the PA Provider Pool.

## 2011-08-24 NOTE — Telephone Encounter (Signed)
CHART AT NURSES STATION 

## 2011-08-24 NOTE — Telephone Encounter (Signed)
Rx sent 

## 2011-08-25 ENCOUNTER — Other Ambulatory Visit: Payer: Self-pay | Admitting: Family Medicine

## 2011-10-21 ENCOUNTER — Telehealth: Payer: Self-pay

## 2011-10-21 ENCOUNTER — Encounter: Payer: Medicare Other | Admitting: Physician Assistant

## 2011-10-21 ENCOUNTER — Other Ambulatory Visit: Payer: Self-pay | Admitting: Family Medicine

## 2011-10-21 VITALS — BP 151/89 | HR 63 | Temp 98.6°F | Resp 16 | Ht 65.5 in | Wt 197.0 lb

## 2011-10-21 DIAGNOSIS — R8281 Pyuria: Secondary | ICD-10-CM

## 2011-10-21 LAB — POCT URINALYSIS DIPSTICK
Protein, UA: 30
Spec Grav, UA: 1.02
Urobilinogen, UA: 0.2

## 2011-10-21 LAB — POCT UA - MICROSCOPIC ONLY
Casts, Ur, LPF, POC: NEGATIVE
Crystals, Ur, HPF, POC: NEGATIVE
Yeast, UA: NEGATIVE

## 2011-10-21 MED ORDER — CIPROFLOXACIN HCL 250 MG PO TABS: 250.0000 mg | ORAL_TABLET | Freq: Two times a day (BID) | ORAL | Status: DC

## 2011-10-21 NOTE — Telephone Encounter (Signed)
URINE RAN AND RESULTS IN CHART.

## 2011-10-21 NOTE — Telephone Encounter (Signed)
.  [facility] Patient called to ask that even though she left without being seen, could her urinalysis still be processed and if needed could medicine be called in to her pharmacy prior to 2 pm today.  Patient also requests that the results given to patient by phone at [phone].

## 2011-10-21 NOTE — Telephone Encounter (Signed)
Let patient know that her urine shows infection and that I called in Cipro

## 2011-10-21 NOTE — Telephone Encounter (Signed)
Spoke with pt and told her that urine test shows infection and Rx for Cipro has been sent in.

## 2011-11-03 ENCOUNTER — Other Ambulatory Visit: Payer: Self-pay | Admitting: Family Medicine

## 2011-11-03 ENCOUNTER — Telehealth: Payer: Self-pay

## 2011-11-03 NOTE — Telephone Encounter (Signed)
Pt CB and said that the order for her undergarments has been signed off on already. [facility] just needs to be given the OK to change size from Med to Large. Called [facility] and was told they need a whole new order signed and they had sent form already, but they will refax order to be completed.

## 2011-11-03 NOTE — Telephone Encounter (Signed)
LMOM to call back

## 2011-11-03 NOTE — Telephone Encounter (Signed)
Spoke with patient and she stated that Dr. Hal Hope called in the rx for undergarments last year and needs the rx filled again.  Can you do this?

## 2011-11-03 NOTE — Telephone Encounter (Signed)
PT SAYS SHE HAS RX FOR UNDERGARMENTS; FILLS THROUGH [facility], WHO SENDS THEM TO HER.  [facility] NEEDS AN UPDATED RX FROM [place]; OLD ONE HAS RUN OUT; WASN'T SURE WHETHER MED SUPPLY CO'S USE SURESCRIPT .Marland Kitchen... BEST NUMBER FOR PT IS [phone]

## 2011-11-04 NOTE — Telephone Encounter (Signed)
Filled out new order for supplies and faxed back signed order to [facility] on 11/03/11. Called pt and LMOM to let her know it has been done.

## 2011-12-31 ENCOUNTER — Other Ambulatory Visit: Payer: Self-pay | Admitting: Physician Assistant

## 2012-01-03 ENCOUNTER — Ambulatory Visit (INDEPENDENT_AMBULATORY_CARE_PROVIDER_SITE_OTHER): Payer: Medicare Other | Admitting: Family Medicine

## 2012-01-03 VITALS — BP 156/71 | HR 58 | Temp 98.1°F | Resp 16 | Ht 65.5 in | Wt 199.0 lb

## 2012-01-03 DIAGNOSIS — G4700 Insomnia, unspecified: Secondary | ICD-10-CM | POA: Diagnosis not present

## 2012-01-03 DIAGNOSIS — R3915 Urgency of urination: Secondary | ICD-10-CM | POA: Diagnosis not present

## 2012-01-03 DIAGNOSIS — M549 Dorsalgia, unspecified: Secondary | ICD-10-CM | POA: Diagnosis not present

## 2012-01-03 LAB — POCT UA - MICROSCOPIC ONLY
Bacteria, U Microscopic: NEGATIVE
Casts, Ur, LPF, POC: NEGATIVE
Crystals, Ur, HPF, POC: NEGATIVE
Mucus, UA: NEGATIVE
RBC, urine, microscopic: NEGATIVE
WBC, Ur, HPF, POC: NEGATIVE
Yeast, UA: NEGATIVE

## 2012-01-03 LAB — POCT URINALYSIS DIPSTICK
Bilirubin, UA: NEGATIVE
Blood, UA: NEGATIVE
Glucose, UA: NEGATIVE
Ketones, UA: NEGATIVE
Leukocytes, UA: NEGATIVE
Nitrite, UA: NEGATIVE
Protein, UA: NEGATIVE
Spec Grav, UA: 1.015
Urobilinogen, UA: 0.2
pH, UA: 8.5

## 2012-01-03 MED ORDER — LORAZEPAM 0.5 MG PO TABS: 0.5000 mg | ORAL_TABLET | Freq: Two times a day (BID) | ORAL | Status: AC | PRN

## 2012-01-03 MED ORDER — PREDNISONE 20 MG PO TABS: 40.0000 mg | ORAL_TABLET | Freq: Every day | ORAL | Status: AC

## 2012-01-03 NOTE — Progress Notes (Signed)
This is an 78 year old woman who cares for her to power of attorney children. They're becoming more more difficult for her to manage and she does have some outside help now.  Patient has had chronic urgency of urination but comes in for low back pain. She remembers getting up quickly to go to the bathroom several days ago and subsequently awoke with low back pain. This is been going on roughly 3 days.  She has no fever, no bowel problems. There is no radiation of the pain down her leg she has no history of chronic back pain. She has no specific acute injuries or trauma related to this.  Patient also complains about chronic insomnia. She's been taking Xanax for this but still wakes up for in the morning and can't back to sleep.  Objective: No acute distress. Patient is elderly overweight woman who is in otherwise reasonable health. HEENT: Unremarkable Chest: Clear Heart: Regular no murmur Abdomen: Soft nontender without HSM Range of motion of both hips is normal with normal straight-leg raising, internal and external rotation. There is no pain in the right hip. Patient has no localized back tenderness or CVA tenderness. Skin: No rash Results for orders placed in visit on 01/03/12  POCT UA - MICROSCOPIC ONLY      Component Value Range   WBC, Ur, HPF, POC negative     RBC, urine, microscopic negative     Bacteria, U Microscopic negative     Mucus, UA negative     Epithelial cells, urine per micros 0-1     Crystals, Ur, HPF, POC negative     Casts, Ur, LPF, POC negative     Yeast, UA negative    POCT URINALYSIS DIPSTICK      Component Value Range   Color, UA yellow     Clarity, UA slightly cloudy     Glucose, UA negative     Bilirubin, UA negative     Ketones, UA negative     Spec Grav, UA 1.015     Blood, UA negative     pH, UA 8.5     Protein, UA negative     Urobilinogen, UA 0.2     Nitrite, UA negative     Leukocytes, UA Negative       Assessment:  Mild to moderate low  back pain without red flags, insomnia, urinary urgency  Plan:  1. Urinary urgency  Urine culture, POCT UA - Microscopic Only, POCT urinalysis dipstick  2. Back pain  predniSONE (DELTASONE) 20 MG tablet  3. Insomnia  LORazepam (ATIVAN) 0.5 MG tablet

## 2012-01-05 ENCOUNTER — Other Ambulatory Visit: Payer: Self-pay | Admitting: Family Medicine

## 2012-01-05 LAB — URINE CULTURE: Colony Count: 50000

## 2012-01-05 MED ORDER — NAPROXEN 500 MG PO TABS: 500.0000 mg | ORAL_TABLET | Freq: Two times a day (BID) | ORAL | Status: DC

## 2012-01-05 MED ORDER — PANTOPRAZOLE SODIUM 40 MG PO TBEC: 40.0000 mg | DELAYED_RELEASE_TABLET | Freq: Every day | ORAL | Status: DC

## 2012-01-07 ENCOUNTER — Other Ambulatory Visit: Payer: Self-pay | Admitting: Family Medicine

## 2012-01-23 ENCOUNTER — Other Ambulatory Visit: Payer: Self-pay | Admitting: Family Medicine

## 2012-01-23 ENCOUNTER — Other Ambulatory Visit: Payer: Self-pay | Admitting: Physician Assistant

## 2012-02-16 ENCOUNTER — Telehealth: Payer: Self-pay

## 2012-02-16 ENCOUNTER — Other Ambulatory Visit: Payer: Self-pay | Admitting: Family Medicine

## 2012-02-16 DIAGNOSIS — E119 Type 2 diabetes mellitus without complications: Secondary | ICD-10-CM

## 2012-02-16 NOTE — Telephone Encounter (Signed)
I did not see a future order on this pt do you need the pt to come in and be seen by you, or is this suppose to only be Lab visit only, if so what labs need to be future ordered? Thank you.    Called pt to let her know we would be in touch later this week in regards to visit .Marland Kitchen She stated she does need her cholesterol and diabetes check up with this.

## 2012-02-16 NOTE — Telephone Encounter (Signed)
Pt states she needs to come in for labwork as per dr Milus Glazier. She is under the impression that she will not have to see a dr and wants to verify there are orders in her chart and she won't have to see dr. She has to arrange a ride and needs to get an idea of time for visit.

## 2012-02-27 ENCOUNTER — Other Ambulatory Visit (INDEPENDENT_AMBULATORY_CARE_PROVIDER_SITE_OTHER): Payer: Medicare Other | Admitting: Family Medicine

## 2012-02-27 VITALS — BP 142/80 | HR 61 | Temp 98.6°F | Resp 20 | Ht 64.0 in | Wt 197.0 lb

## 2012-02-27 DIAGNOSIS — I10 Essential (primary) hypertension: Secondary | ICD-10-CM

## 2012-02-27 DIAGNOSIS — E119 Type 2 diabetes mellitus without complications: Secondary | ICD-10-CM | POA: Diagnosis not present

## 2012-02-27 DIAGNOSIS — R82998 Other abnormal findings in urine: Secondary | ICD-10-CM | POA: Diagnosis not present

## 2012-02-27 DIAGNOSIS — R8281 Pyuria: Secondary | ICD-10-CM

## 2012-02-27 LAB — COMPREHENSIVE METABOLIC PANEL
ALT: 17 U/L (ref 0–35)
AST: 22 U/L (ref 0–37)
Albumin: 4.3 g/dL (ref 3.5–5.2)
Alkaline Phosphatase: 50 U/L (ref 39–117)
BUN: 18 mg/dL (ref 6–23)
CO2: 32 mEq/L (ref 19–32)
Calcium: 9.9 mg/dL (ref 8.4–10.5)
Chloride: 104 mEq/L (ref 96–112)
Creat: 0.91 mg/dL (ref 0.50–1.10)
Glucose, Bld: 85 mg/dL (ref 70–99)
Potassium: 4.9 mEq/L (ref 3.5–5.3)
Sodium: 143 mEq/L (ref 135–145)
Total Bilirubin: 0.3 mg/dL (ref 0.3–1.2)
Total Protein: 7.3 g/dL (ref 6.0–8.3)

## 2012-02-27 LAB — HEMOGLOBIN A1C
Hgb A1c MFr Bld: 6.1 % — ABNORMAL HIGH (ref <5.7)
Mean Plasma Glucose: 128 mg/dL — ABNORMAL HIGH (ref <117)

## 2012-02-27 LAB — LIPID PANEL
Cholesterol: 152 mg/dL (ref 0–200)
HDL: 49 mg/dL (ref >39)
LDL Cholesterol: 92 mg/dL (ref 0–99)
Total CHOL/HDL Ratio: 3.1 Ratio
Triglycerides: 53 mg/dL (ref <150)
VLDL: 11 mg/dL (ref 0–40)

## 2012-02-28 LAB — MICROALBUMIN, URINE: Microalb, Ur: 1.1 mg/dL (ref 0.00–1.89)

## 2012-02-28 LAB — URINE CULTURE: Colony Count: 45000

## 2012-03-02 ENCOUNTER — Other Ambulatory Visit: Payer: Self-pay | Admitting: Physician Assistant

## 2012-03-05 NOTE — Telephone Encounter (Signed)
Pt still waiting to be contacted about her lab results, she says she has some rx that need to be refilled and needs to know if she needs to come back in for blood work. [phone]

## 2012-03-09 ENCOUNTER — Other Ambulatory Visit: Payer: Self-pay

## 2012-03-09 MED ORDER — ALPRAZOLAM 0.25 MG PO TABS: ORAL_TABLET | ORAL | Status: DC

## 2012-03-15 ENCOUNTER — Other Ambulatory Visit: Payer: Self-pay | Admitting: Physician Assistant

## 2012-03-30 NOTE — Progress Notes (Signed)
This encounter was created in error - please disregard.

## 2012-04-06 ENCOUNTER — Other Ambulatory Visit: Payer: Self-pay

## 2012-04-06 MED ORDER — ALPRAZOLAM 0.25 MG PO TABS: ORAL_TABLET | ORAL | Status: DC

## 2012-04-10 ENCOUNTER — Other Ambulatory Visit: Payer: Self-pay | Admitting: Family Medicine

## 2012-04-19 ENCOUNTER — Other Ambulatory Visit: Payer: Self-pay | Admitting: Family Medicine

## 2012-04-21 ENCOUNTER — Other Ambulatory Visit: Payer: Self-pay | Admitting: Family Medicine

## 2012-05-11 ENCOUNTER — Telehealth: Payer: Self-pay

## 2012-05-11 NOTE — Telephone Encounter (Signed)
PATIENT STATES SHE HAS HAD A FLARE-UP OF GOUT IN HER (L) GREAT TOE. SHE CAN HARDLY WALK AND SHE CANNOT PUT ANY SHOES ON. SHE WOULD LIKE TO GET SOMETHING CALLED INTO THE PHARMACY PLEASE. BEST PHONE [phone] (HOME)   [facility] [address]  MBC

## 2012-05-11 NOTE — Telephone Encounter (Signed)
Please pull patient chart 

## 2012-05-12 ENCOUNTER — Other Ambulatory Visit: Payer: Self-pay | Admitting: Family Medicine

## 2012-05-12 DIAGNOSIS — M109 Gout, unspecified: Secondary | ICD-10-CM

## 2012-05-12 MED ORDER — COLCHICINE 0.6 MG PO TABS: 0.6000 mg | ORAL_TABLET | Freq: Three times a day (TID) | ORAL | Status: DC | PRN

## 2012-05-12 NOTE — Telephone Encounter (Signed)
Colchicine has been ordered

## 2012-05-12 NOTE — Telephone Encounter (Signed)
Chart pulled at PA pool [place] the [place] [id number].

## 2012-05-12 NOTE — Telephone Encounter (Signed)
Pt reports that we have been treating her for gout for years, and we normally give her colchicine which has always helped her. Dr Unknown Foley first txd her for this but has since seen several other MDs here for it. Dr L, you saw the pt last but it was not for gout. Do you want to Rx colchicine for her or does she need OV first?

## 2012-05-12 NOTE — Telephone Encounter (Signed)
Left message for patient to return call.

## 2012-05-12 NOTE — Telephone Encounter (Signed)
I have reviewed the patient's chart both in Epic and on paper.  I see that someone has listed gout on her problem list but we have not treated her for this as far as I can tell.  She will need to come in and be seen.

## 2012-05-13 NOTE — Telephone Encounter (Signed)
Notified pt that Rx was sent in. Pt was very appreciative.

## 2012-05-30 ENCOUNTER — Other Ambulatory Visit: Payer: Self-pay | Admitting: Physician Assistant

## 2012-06-23 ENCOUNTER — Other Ambulatory Visit: Payer: Self-pay | Admitting: Physician Assistant

## 2012-06-30 ENCOUNTER — Other Ambulatory Visit: Payer: Self-pay | Admitting: Physician Assistant

## 2012-07-09 ENCOUNTER — Other Ambulatory Visit: Payer: Self-pay | Admitting: Physician Assistant

## 2012-07-11 ENCOUNTER — Other Ambulatory Visit: Payer: Self-pay | Admitting: Physician Assistant

## 2012-07-22 ENCOUNTER — Other Ambulatory Visit: Payer: Self-pay | Admitting: Physician Assistant

## 2012-08-03 ENCOUNTER — Encounter: Payer: Self-pay | Admitting: Family Medicine

## 2012-08-03 NOTE — Progress Notes (Signed)
NO SHOW. This encounter was created in error - please disregard.

## 2012-08-03 NOTE — Patient Instructions (Signed)
-  We have ordered labs or studies at this visit. It can take up to 1-2 weeks for results and processing. We will contact you with instructions IF your results are abnormal. Normal results will be released to your MYCHART. If you have not heard from us or can not find your results in MYCHART in 2 weeks please contact our office.  -PLEASE SIGN UP FOR MYCHART TODAY   We recommend the following healthy lifestyle measures: - eat a healthy diet consisting of lots of vegetables, fruits, beans, nuts, seeds, healthy meats such as white chicken and fish and whole grains.  - avoid fried foods, fast food, processed foods, sodas, red meet and other fattening foods.  - get a least 150 minutes of aerobic exercise per week.   Follow up in:  

## 2012-08-05 ENCOUNTER — Other Ambulatory Visit: Payer: Self-pay | Admitting: Physician Assistant

## 2012-08-19 ENCOUNTER — Other Ambulatory Visit: Payer: Self-pay | Admitting: Family Medicine

## 2012-08-23 ENCOUNTER — Other Ambulatory Visit: Payer: Self-pay | Admitting: Family Medicine

## 2012-08-27 ENCOUNTER — Other Ambulatory Visit: Payer: Self-pay | Admitting: Family Medicine

## 2012-09-19 ENCOUNTER — Other Ambulatory Visit: Payer: Self-pay | Admitting: Physician Assistant

## 2012-09-23 ENCOUNTER — Other Ambulatory Visit: Payer: Self-pay | Admitting: Physician Assistant

## 2012-09-24 NOTE — Telephone Encounter (Signed)
Dr Milus Glazier, is this a pt who you see just once a year for med refills or does she need to come in first for Tramadol RF?

## 2012-10-27 ENCOUNTER — Telehealth: Payer: Self-pay | Admitting: Family Medicine

## 2012-10-27 NOTE — Telephone Encounter (Signed)
Yes we did receive a fax on it, its going to [facility] box as soon as it is CLF'd and then he can sign off on it when he is here.

## 2012-10-27 NOTE — Telephone Encounter (Signed)
Patient called to see if we have received form from medical supply company for her undergarments (depends). Do you have or see if we have a form for this? Thanks. I also asked her to have company re fax just in case.  Ph# [phone]

## 2012-10-27 NOTE — Telephone Encounter (Signed)
Can one of you sign the form?

## 2012-10-28 NOTE — Telephone Encounter (Signed)
Forms signed and placed in outgoing fax pile

## 2012-10-28 NOTE — Telephone Encounter (Signed)
Patient advised form completed and faxed for her, Amy Gallagher

## 2012-10-29 ENCOUNTER — Other Ambulatory Visit: Payer: Self-pay | Admitting: Physician Assistant

## 2012-10-30 ENCOUNTER — Other Ambulatory Visit: Payer: Self-pay | Admitting: Physician Assistant

## 2012-11-01 ENCOUNTER — Telehealth: Payer: Self-pay

## 2012-11-01 NOTE — Telephone Encounter (Signed)
Called her, she is calling about her undergarments, advised her forms have been signed. She states forms were signed by someone other than Dr Milus Glazier, have you gotten new forms from the medical supply company?

## 2012-11-01 NOTE — Telephone Encounter (Signed)
Yes, just received another fax from [facility] About medical undergarments. Will be in Dr. Cain Saupe box for him to sign off on.

## 2012-11-01 NOTE — Telephone Encounter (Signed)
Pt. Has a question about her medical supplies. Please call.  [phone]

## 2012-11-05 ENCOUNTER — Telehealth: Payer: Self-pay

## 2012-11-05 NOTE — Telephone Encounter (Signed)
They did not accept the form signed by PA so Dr Milus Glazier has filled out again and it has been refaxed. Called patient to advise.

## 2012-11-05 NOTE — Telephone Encounter (Signed)
Pt is calling about the form that was supposed to be faxed back to her medical supply place   Best number [phone]

## 2012-11-19 ENCOUNTER — Other Ambulatory Visit: Payer: Self-pay | Admitting: Physician Assistant

## 2012-11-24 ENCOUNTER — Other Ambulatory Visit: Payer: Self-pay | Admitting: Physician Assistant

## 2012-11-24 ENCOUNTER — Other Ambulatory Visit: Payer: Self-pay | Admitting: Family Medicine

## 2012-12-02 ENCOUNTER — Other Ambulatory Visit: Payer: Self-pay | Admitting: Physician Assistant

## 2012-12-16 ENCOUNTER — Other Ambulatory Visit: Payer: Self-pay | Admitting: Physician Assistant

## 2012-12-21 ENCOUNTER — Other Ambulatory Visit: Payer: Self-pay | Admitting: Physician Assistant

## 2013-01-08 ENCOUNTER — Other Ambulatory Visit: Payer: Self-pay | Admitting: Family Medicine

## 2013-01-11 ENCOUNTER — Other Ambulatory Visit: Payer: Self-pay | Admitting: Physician Assistant

## 2013-02-08 ENCOUNTER — Telehealth: Payer: Self-pay

## 2013-02-08 ENCOUNTER — Other Ambulatory Visit: Payer: Self-pay

## 2013-02-08 MED ORDER — PANTOPRAZOLE SODIUM 40 MG PO TBEC: 40.0000 mg | DELAYED_RELEASE_TABLET | Freq: Every day | ORAL | Status: DC

## 2013-02-08 NOTE — Telephone Encounter (Signed)
[facility] called [facility] to request a bedside commode since she is having trouble bending her knees due to gout. They told her to call here and get an rx. Told her Dr L was out until Aug 6th and she hopes that someone else can write this for her as she is in a lot of pain.

## 2013-02-08 NOTE — Telephone Encounter (Signed)
We would need to see her for someone else to do this order. She has not been seen in greater than one year, and there is not a mention about her knees, called her to advise. She is aware Dr Milus Glazier is out of the office. She states she can not come in at this time, she states she may go to ER.

## 2013-02-16 ENCOUNTER — Telehealth: Payer: Self-pay

## 2013-02-16 DIAGNOSIS — M25569 Pain in unspecified knee: Secondary | ICD-10-CM

## 2013-02-16 NOTE — Telephone Encounter (Signed)
PT HAS AN APPT WITH DR Milus Glazier NEXT THURSDAY BUT HER KNEES ARE HURTING VERY BAD AND  SHE WOULD LIKE TO HAVE SOMETHING TO EASE THE PAIN. [facility] [address] HER # IS [id number]

## 2013-02-17 ENCOUNTER — Telehealth: Payer: Self-pay | Admitting: Radiology

## 2013-02-17 MED ORDER — TRAMADOL HCL 50 MG PO TABS: 50.0000 mg | ORAL_TABLET | Freq: Three times a day (TID) | ORAL | Status: DC | PRN

## 2013-02-17 NOTE — Telephone Encounter (Signed)
Patient is calling in reference to a pain medication that was to be called in for her knee pain. Patient thinks she received a call from our office and was calling to check the progress of this medicine. [phone]

## 2013-02-17 NOTE — Telephone Encounter (Signed)
I will e prescribe tramadol

## 2013-02-17 NOTE — Telephone Encounter (Signed)
Patient advised.

## 2013-02-17 NOTE — Telephone Encounter (Signed)
Dr Milus Glazier did not know this printed, I have called in.

## 2013-02-24 ENCOUNTER — Ambulatory Visit (INDEPENDENT_AMBULATORY_CARE_PROVIDER_SITE_OTHER): Payer: Medicare Other | Admitting: Family Medicine

## 2013-02-24 ENCOUNTER — Encounter: Payer: Self-pay | Admitting: Family Medicine

## 2013-02-24 VITALS — BP 139/61 | HR 66 | Temp 98.2°F | Resp 16 | Ht 64.0 in | Wt 200.0 lb

## 2013-02-24 DIAGNOSIS — E119 Type 2 diabetes mellitus without complications: Secondary | ICD-10-CM

## 2013-02-24 DIAGNOSIS — R350 Frequency of micturition: Secondary | ICD-10-CM | POA: Diagnosis not present

## 2013-02-24 DIAGNOSIS — M1710 Unilateral primary osteoarthritis, unspecified knee: Secondary | ICD-10-CM

## 2013-02-24 LAB — POCT URINALYSIS DIPSTICK
Bilirubin, UA: NEGATIVE
Blood, UA: NEGATIVE
Glucose, UA: NEGATIVE
Ketones, UA: NEGATIVE
Leukocytes, UA: NEGATIVE
Nitrite, UA: NEGATIVE
Protein, UA: NEGATIVE
Spec Grav, UA: 1.015
Urobilinogen, UA: 0.2
pH, UA: 7.5

## 2013-02-24 LAB — GLUCOSE, POCT (MANUAL RESULT ENTRY): POC Glucose: 102 mg/dl — AB (ref 70–99)

## 2013-02-24 LAB — POCT UA - MICROSCOPIC ONLY
Bacteria, U Microscopic: NEGATIVE
Casts, Ur, LPF, POC: NEGATIVE
Crystals, Ur, HPF, POC: NEGATIVE
Mucus, UA: NEGATIVE
WBC, Ur, HPF, POC: NEGATIVE
Yeast, UA: NEGATIVE

## 2013-02-24 LAB — POCT GLYCOSYLATED HEMOGLOBIN (HGB A1C): Hemoglobin A1C: 5.6

## 2013-02-24 MED ORDER — OXYBUTYNIN CHLORIDE ER 5 MG PO TB24: 5.0000 mg | ORAL_TABLET | Freq: Every day | ORAL | Status: DC

## 2013-02-24 MED ORDER — METHYLPREDNISOLONE ACETATE 80 MG/ML IJ SUSP
80.0000 mg | Freq: Once | INTRAMUSCULAR | Status: AC
  Administered 2013-02-24: 80 mg via INTRA_ARTICULAR

## 2013-02-24 NOTE — Progress Notes (Signed)
Sx:  Patient has urinary frequency and bilateral knee pain.    Results for orders placed in visit on 02/27/12  URINE CULTURE      Result Value Range   Colony Count 45,000 COLONIES/ML     Organism ID, Bacteria Multiple bacterial morphotypes present, none     Organism ID, Bacteria predominant. Suggest appropriate recollection if      Organism ID, Bacteria clinically indicated.    COMPREHENSIVE METABOLIC PANEL      Result Value Range   Sodium 143  135 - 145 mEq/L   Potassium 4.9  3.5 - 5.3 mEq/L   Chloride 104  96 - 112 mEq/L   CO2 32  19 - 32 mEq/L   Glucose, Bld 85  70 - 99 mg/dL   BUN 18  6 - 23 mg/dL   Creat 1.02  7.25 - 3.66 mg/dL   Total Bilirubin 0.3  0.3 - 1.2 mg/dL   Alkaline Phosphatase 50  39 - 117 U/L   AST 22  0 - 37 U/L   ALT 17  0 - 35 U/L   Total Protein 7.3  6.0 - 8.3 g/dL   Albumin 4.3  3.5 - 5.2 g/dL   Calcium 9.9  8.4 - 44.0 mg/dL  LIPID PANEL      Result Value Range   Cholesterol 152  0 - 200 mg/dL   Triglycerides 53  <347 mg/dL   HDL 49  >42 mg/dL   Total CHOL/HDL Ratio 3.1     VLDL 11  0 - 40 mg/dL   LDL Cholesterol 92  0 - 99 mg/dL  MICROALBUMIN, URINE      Result Value Range   Microalb, Ur 1.10  0.00 - 1.89 mg/dL  HEMOGLOBIN V9D      Result Value Range   Hemoglobin A1C 6.1 (*) <5.7 %   Mean Plasma Glucose 128 (*) <117 mg/dL   Results for orders placed in visit on 02/24/13  POCT URINALYSIS DIPSTICK      Result Value Range   Color, UA yellow     Clarity, UA clear     Glucose, UA neg     Bilirubin, UA neg     Ketones, UA neg     Spec Grav, UA 1.015     Blood, UA neg     pH, UA 7.5     Protein, UA neg     Urobilinogen, UA 0.2     Nitrite, UA neg     Leukocytes, UA Negative    POCT UA - MICROSCOPIC ONLY      Result Value Range   WBC, Ur, HPF, POC neg     RBC, urine, microscopic 0-4     Bacteria, U Microscopic neg     Mucus, UA neg     Epithelial cells, urine per micros 0-4     Crystals, Ur, HPF, POC neg     Casts, Ur, LPF, POC neg      Yeast, UA neg     Bilateral knee synovial thickening with small effusions  Sterile prep (betadine), bilateral knee injections without complications:  Depomedrol 80 and xylocaine !% 1cc each instilled.  Dramatic relief  Urinary frequency - Plan: POCT urinalysis dipstick, POCT UA - Microscopic Only, POCT glucose (manual entry), POCT glycosylated hemoglobin (Hb A1C)  Type II or unspecified type diabetes mellitus without mention of complication, not stated as uncontrolled - Plan: POCT glucose (manual entry), POCT glycosylated hemoglobin (Hb A1C)  Arthritis of knee - Plan:  methylPREDNISolone acetate (DEPO-MEDROL) injection 80 mg, methylPREDNISolone acetate (DEPO-MEDROL) injection 80 mg  Signed, Elvina Sidle, MD

## 2013-02-25 ENCOUNTER — Other Ambulatory Visit: Payer: Self-pay | Admitting: Family Medicine

## 2013-02-25 NOTE — Telephone Encounter (Signed)
Pt called and says that she saw Dr. Milus Glazier yesterday for her gout. She forgot to ask him if she can eat anything other than chicken. She states that she is tired of eating chicken and would really like to have chocolate cake this weekend. Please call. [phone]

## 2013-02-27 ENCOUNTER — Other Ambulatory Visit: Payer: Self-pay | Admitting: Family Medicine

## 2013-03-01 ENCOUNTER — Other Ambulatory Visit: Payer: Self-pay | Admitting: Family Medicine

## 2013-03-13 ENCOUNTER — Other Ambulatory Visit: Payer: Self-pay | Admitting: Physician Assistant

## 2013-03-19 ENCOUNTER — Telehealth: Payer: Self-pay

## 2013-03-19 NOTE — Telephone Encounter (Signed)
PT CALLED AND WANTS TO KNOW IF SOMETHING CAN BE PRESCRIBED ANKLE PAIN

## 2013-03-20 NOTE — Telephone Encounter (Signed)
Spoke with Amy Gallagher, advised she should come in to see Dr Milus Glazier to evaluate ankle pain. Amy Gallagher will try to come today.

## 2013-03-24 ENCOUNTER — Other Ambulatory Visit: Payer: Self-pay | Admitting: Physician Assistant

## 2013-03-27 ENCOUNTER — Other Ambulatory Visit: Payer: Self-pay | Admitting: Family Medicine

## 2013-03-28 ENCOUNTER — Telehealth: Payer: Self-pay | Admitting: Radiology

## 2013-03-28 NOTE — Telephone Encounter (Signed)
Faxed Rx

## 2013-04-11 ENCOUNTER — Other Ambulatory Visit: Payer: Self-pay | Admitting: Physician Assistant

## 2013-04-12 NOTE — Telephone Encounter (Signed)
Dr L, you have seen pt recently for a check-up, but don't see where you specifically addressed HTN. Is it OK to RF lisinopril for 6 mos from OV or do you need to see pt back?

## 2013-04-25 ENCOUNTER — Other Ambulatory Visit: Payer: Self-pay | Admitting: Family Medicine

## 2013-04-27 ENCOUNTER — Other Ambulatory Visit: Payer: Self-pay

## 2013-05-23 ENCOUNTER — Emergency Department (HOSPITAL_COMMUNITY): Payer: Medicare Other

## 2013-05-23 ENCOUNTER — Emergency Department (HOSPITAL_COMMUNITY)
Admission: EM | Admit: 2013-05-23 | Discharge: 2013-05-23 | Disposition: A | Discharge status: Home / Self Care | Payer: Medicare Other | Attending: Emergency Medicine | Admitting: Emergency Medicine

## 2013-05-23 ENCOUNTER — Encounter (HOSPITAL_COMMUNITY): Payer: Self-pay | Admitting: Emergency Medicine

## 2013-05-23 DIAGNOSIS — Z8639 Personal history of other endocrine, nutritional and metabolic disease: Secondary | ICD-10-CM | POA: Insufficient documentation

## 2013-05-23 DIAGNOSIS — Z87891 Personal history of nicotine dependence: Secondary | ICD-10-CM | POA: Diagnosis not present

## 2013-05-23 DIAGNOSIS — S7000XA Contusion of unspecified hip, initial encounter: Secondary | ICD-10-CM | POA: Diagnosis not present

## 2013-05-23 DIAGNOSIS — Z8709 Personal history of other diseases of the respiratory system: Secondary | ICD-10-CM | POA: Diagnosis not present

## 2013-05-23 DIAGNOSIS — Z880 Allergy status to penicillin: Secondary | ICD-10-CM | POA: Diagnosis not present

## 2013-05-23 DIAGNOSIS — Z79899 Other long term (current) drug therapy: Secondary | ICD-10-CM | POA: Diagnosis not present

## 2013-05-23 DIAGNOSIS — Z791 Long term (current) use of non-steroidal anti-inflammatories (NSAID): Secondary | ICD-10-CM | POA: Insufficient documentation

## 2013-05-23 DIAGNOSIS — M129 Arthropathy, unspecified: Secondary | ICD-10-CM | POA: Diagnosis not present

## 2013-05-23 DIAGNOSIS — Z862 Personal history of diseases of the blood and blood-forming organs and certain disorders involving the immune mechanism: Secondary | ICD-10-CM | POA: Diagnosis not present

## 2013-05-23 DIAGNOSIS — IMO0002 Reserved for concepts with insufficient information to code with codable children: Secondary | ICD-10-CM | POA: Insufficient documentation

## 2013-05-23 DIAGNOSIS — S79919A Unspecified injury of unspecified hip, initial encounter: Secondary | ICD-10-CM | POA: Diagnosis not present

## 2013-05-23 DIAGNOSIS — Y9389 Activity, other specified: Secondary | ICD-10-CM | POA: Insufficient documentation

## 2013-05-23 DIAGNOSIS — M25559 Pain in unspecified hip: Secondary | ICD-10-CM | POA: Diagnosis not present

## 2013-05-23 DIAGNOSIS — W2203XA Walked into furniture, initial encounter: Secondary | ICD-10-CM | POA: Insufficient documentation

## 2013-05-23 DIAGNOSIS — Y929 Unspecified place or not applicable: Secondary | ICD-10-CM | POA: Insufficient documentation

## 2013-05-23 DIAGNOSIS — W010XXA Fall on same level from slipping, tripping and stumbling without subsequent striking against object, initial encounter: Secondary | ICD-10-CM | POA: Insufficient documentation

## 2013-05-23 DIAGNOSIS — S7002XA Contusion of left hip, initial encounter: Secondary | ICD-10-CM

## 2013-05-23 HISTORY — DX: Unspecified osteoarthritis, unspecified site: M19.90

## 2013-05-23 HISTORY — DX: Bronchitis, not specified as acute or chronic: J40

## 2013-05-23 HISTORY — DX: Gout, unspecified: M10.9

## 2013-05-23 MED ORDER — HYDROCODONE-ACETAMINOPHEN 5-325 MG PO TABS: 1.0000 | ORAL_TABLET | Freq: Four times a day (QID) | ORAL | Status: DC | PRN

## 2013-05-23 MED ORDER — TRAMADOL HCL 50 MG PO TABS
50.0000 mg | ORAL_TABLET | Freq: Once | ORAL | Status: AC
  Administered 2013-05-23: 50 mg via ORAL
  Filled 2013-05-23: qty 1

## 2013-05-23 NOTE — ED Provider Notes (Signed)
CSN: [id number]     Arrival date & time 05/23/13  1735 History   First MD Initiated Contact with Patient 05/23/13 1819     Chief Complaint  Patient presents with  . Fall  . Hip Pain   (Consider location/radiation/quality/duration/timing/severity/associated sxs/prior Treatment) HPI Comments: Pt is a 79 y.o. female with Pmhx as above who presents with L hip pain location in anterior inguinal area after mechanical fall several hours ago at home.  She states she tripped, hit L hip on chair, landed on R hip, though L hip is the painful side.   Patient is a 79 y.o. female presenting with hip pain. The history is provided by the patient. No language interpreter was used.  Hip Pain This is a new problem. The current episode started 1 to 2 hours ago. The problem occurs constantly. The problem has not changed since onset.Pertinent negatives include no chest pain, no abdominal pain, no headaches and no shortness of breath. The symptoms are aggravated by walking. The symptoms are relieved by rest. She has tried rest for the symptoms. The treatment provided no relief.    Past Medical History  Diagnosis Date  . Gout   . Arthritis   . Bronchitis    Past Surgical History  Procedure Laterality Date  . Salpingectomy      right   History reviewed. No pertinent family history. History  Substance Use Topics  . Smoking status: Former Smoker    Types: Cigarettes    Quit date: 02/26/1997  . Smokeless tobacco: Not on file  . Alcohol Use: No   OB History   Grav Para Term Preterm Abortions TAB SAB Ect Mult Living                 Review of Systems  Constitutional: Negative for fever, chills, diaphoresis, activity change, appetite change and fatigue.  HENT: Negative for congestion, facial swelling, rhinorrhea and sore throat.   Eyes: Negative for photophobia and discharge.  Respiratory: Negative for cough, chest tightness and shortness of breath.   Cardiovascular: Negative for chest pain,  palpitations and leg swelling.  Gastrointestinal: Negative for nausea, vomiting, abdominal pain and diarrhea.  Endocrine: Negative for polydipsia and polyuria.  Genitourinary: Negative for dysuria, frequency, difficulty urinating and pelvic pain.  Musculoskeletal: Positive for arthralgias. Negative for back pain, neck pain and neck stiffness.  Skin: Negative for color change and wound.  Allergic/Immunologic: Negative for immunocompromised state.  Neurological: Negative for facial asymmetry, weakness, numbness and headaches.  Hematological: Does not bruise/bleed easily.  Psychiatric/Behavioral: Negative for confusion and agitation.    Allergies  Aspirin and Penicillins  Home Medications   Current Outpatient Rx  Name  Route  Sig  Dispense  Refill  . ALPRAZolam (XANAX) 0.25 MG tablet      TAKE 1/2 TABLET BY MOUTH TWICE A DAY AS NEEDED SLEEP   30 tablet   0   . EXPIRED: fexofenadine (ALLEGRA) 180 MG tablet   Oral   Take 1 tablet (180 mg total) by mouth daily.   30 tablet   11   . fluticasone (FLONASE) 50 MCG/ACT nasal spray   Nasal   Place 2 sprays into the nose daily.   16 g   3   . lisinopril-hydrochlorothiazide (PRINZIDE,ZESTORETIC) 20-25 MG per tablet      TAKE 1 TABLET BY MOUTH DAILY.   30 tablet   0   . LORazepam (ATIVAN) 0.5 MG tablet      TAKE 1 TABLET BY MOUTH TWICE  A DAY AS NEEDED FOR ANXIETY   30 tablet   0   . Multiple Vitamin (MULTIVITAMIN WITH MINERALS) TABS tablet   Oral   Take 1 tablet by mouth daily.         . naproxen (NAPROSYN) 500 MG tablet   Oral   Take 1 tablet (500 mg total) by mouth 2 (two) times daily with a meal.   180 tablet   0   . NEXIUM 20 MG packet      TAKE ONE AS DIRECTED BY MOUTH ONCE DAILY   30 each   2   . polyethylene glycol powder (GLYCOLAX/MIRALAX) powder      MIX 17 GM IN 8 OUNCES AS DIRECTED   527 g   0   . PROAIR HFA 108 (90 BASE) MCG/ACT inhaler      INHALE 2 PUFFS BY MOUTH EVERY 4-6 HOURS. NEEDS OFFICE  VISIT   8.5 each   2   . traMADol (ULTRAM) 50 MG tablet      TAKE 1 TABLET EVERY 8 HOURS AS NEEDED FOR PAIN   30 tablet   0   . vitamin C (ASCORBIC ACID) 500 MG tablet   Oral   Take 500 mg by mouth daily.         Marland Kitchen HYDROcodone-acetaminophen (NORCO) 5-325 MG per tablet   Oral   Take 1 tablet by mouth every 6 (six) hours as needed for moderate pain.   10 tablet   0    BP 150/52  Pulse 63  Temp(Src) 98.2 F (36.8 C) (Oral)  Resp 18  SpO2 99% Physical Exam  Constitutional: She is oriented to person, place, and time. She appears well-developed and well-nourished. No distress.  HENT:  Head: Normocephalic and atraumatic.  Mouth/Throat: No oropharyngeal exudate.  Eyes: Pupils are equal, round, and reactive to light.  Neck: Normal range of motion. Neck supple.  Cardiovascular: Normal rate, regular rhythm and normal heart sounds.  Exam reveals no gallop and no friction rub.   No murmur heard. Pulmonary/Chest: Effort normal and breath sounds normal. No respiratory distress. She has no wheezes. She has no rales.  Abdominal: Soft. Bowel sounds are normal. She exhibits no distension and no mass. There is no tenderness. There is no rebound and no guarding.  Musculoskeletal: Normal range of motion. She exhibits no edema and no tenderness.       Left hip: She exhibits normal range of motion, normal strength and no bony tenderness.       Legs: Neurological: She is alert and oriented to person, place, and time.  Skin: Skin is warm and dry.  Psychiatric: She has a normal mood and affect.    ED Course  Procedures (including critical care time) Labs Review Labs Reviewed - No data to display Imaging Review Dg Hip Complete Left  05/23/2013   CLINICAL DATA:  Left hip pain following a fall.  EXAM: LEFT HIP - COMPLETE 2+ VIEW  COMPARISON:  None.  FINDINGS: Unremarkable left hip without fracture dislocation. Subarticular cystic changes involving the right femoral head. Lower lumbar spine  degenerative changes.  IMPRESSION: 1. No fracture or dislocation. 2. Degenerative subarticular cystic changes in the right femoral head. 3. Lower lumbar spine degenerative changes.   Electronically Signed   By: Gordan Payment M.D.   On: 05/23/2013 18:52    EKG Interpretation   None       MDM   1. Contusion of left hip, initial encounter    Pt  is a 79 y.o. female with Pmhx as above who presents with L hip pain location in anterior inguinal area after mechanical fall several hours ago at home.  NVI distally, able to bear wt though w/ pain. No pain w palpation or passive ROM.  The painful side is not the side that hit the ground and is the side that glanced a chair on the way to the ground.  NO acute XR findings.  Pt feeling improved after tramadol & able to bear wt.  I doubt acute fracture given mechanism and feel she is safe for outpt PCP f/u for continued symptoms.  Return precautions given for new or worsening symptoms including worsening pain, inability to bear wt.          Shanna Cisco, MD 05/24/13 [phone]

## 2013-05-23 NOTE — ED Notes (Signed)
Pt had fall earlier today. States she went to sit in chair and missed chair, falling on R hip. Pt states her L hip is hurting, able to bear weight. Pain worse with weight bearing and straightening leg.

## 2013-06-02 ENCOUNTER — Other Ambulatory Visit: Payer: Self-pay | Admitting: Family Medicine

## 2013-06-02 ENCOUNTER — Other Ambulatory Visit: Payer: Self-pay | Admitting: Internal Medicine

## 2013-06-02 NOTE — Telephone Encounter (Signed)
Dr L, I am forwarding this request to you since you are pt's primary. I don't see that she has seen Dr Merla Riches anytime recently.

## 2013-06-04 ENCOUNTER — Other Ambulatory Visit: Payer: Self-pay | Admitting: Family Medicine

## 2013-06-06 ENCOUNTER — Other Ambulatory Visit: Payer: Self-pay | Admitting: Radiology

## 2013-06-08 ENCOUNTER — Ambulatory Visit: Payer: Medicare Other | Admitting: Family Medicine

## 2013-06-23 ENCOUNTER — Telehealth: Payer: Self-pay

## 2013-06-23 DIAGNOSIS — Z23 Encounter for immunization: Secondary | ICD-10-CM | POA: Diagnosis not present

## 2013-06-23 NOTE — Telephone Encounter (Signed)
Patient states that her Tramadol is not working well with the Naproxen. States she was told about a patch that may be more helpful than the Tramadol.   [phone]

## 2013-06-23 NOTE — Telephone Encounter (Signed)
I am not sure that any patch will help her knee pain.  Most commonly, a steroid injection with anesthetic is used.  I can perform this or refer her to orthopedics.

## 2013-06-24 NOTE — Telephone Encounter (Signed)
Patient advised and she prefers to see Dr Milus Glazier at his other office, I have provided her the number there.

## 2013-07-03 ENCOUNTER — Other Ambulatory Visit: Payer: Self-pay | Admitting: Family Medicine

## 2013-07-09 ENCOUNTER — Other Ambulatory Visit: Payer: Self-pay | Admitting: Family Medicine

## 2013-07-15 ENCOUNTER — Other Ambulatory Visit: Payer: Self-pay | Admitting: Family Medicine

## 2013-07-16 ENCOUNTER — Telehealth: Payer: Self-pay

## 2013-07-16 NOTE — Telephone Encounter (Signed)
Patient would a refill on Tramadol to [facility] until Friday[facility] [address]  [phone]

## 2013-07-17 NOTE — Telephone Encounter (Signed)
Okay to refill tramadol x 1 month

## 2013-07-18 MED ORDER — TRAMADOL HCL 50 MG PO TABS: 50.0000 mg | ORAL_TABLET | Freq: Three times a day (TID) | ORAL | Status: DC | PRN

## 2013-07-18 NOTE — Telephone Encounter (Signed)
Faxed to pharmacy

## 2013-07-18 NOTE — Telephone Encounter (Signed)
Tramadol rx printed and signed at TL desk

## 2013-07-21 NOTE — Telephone Encounter (Signed)
faxed

## 2013-07-27 ENCOUNTER — Other Ambulatory Visit: Payer: Self-pay | Admitting: Family Medicine

## 2013-07-29 ENCOUNTER — Other Ambulatory Visit: Payer: Self-pay | Admitting: Family Medicine

## 2013-08-28 ENCOUNTER — Other Ambulatory Visit: Payer: Self-pay | Admitting: Family Medicine

## 2013-09-23 ENCOUNTER — Telehealth: Payer: Self-pay

## 2013-09-23 NOTE — Telephone Encounter (Signed)
PATIENT WOULD LIKE TO TALK WITH BARBARA REGARDING HER MEDICINES'  [phone]

## 2013-09-25 NOTE — Telephone Encounter (Signed)
[facility] Flonase [facility] Lis[facility]opril. [facility] [address]  [phone]

## 2013-09-26 NOTE — Telephone Encounter (Signed)
LMOM for pt explaining we have been sending in notes on her RFs the last couple of months that she is overdue for f/up and she needs to [facility] for RFs. Advised pt that if she gets appt sch soon or calls w/plans to come to 102, I will send in req for Dr L to fill small amount to cover until then. Pt to CB w/plans.

## 2013-09-28 MED ORDER — LISINOPRIL-HYDROCHLOROTHIAZIDE 20-25 MG PO TABS: 1.0000 | ORAL_TABLET | Freq: Every day | ORAL | Status: DC

## 2013-09-28 MED ORDER — FLUTICASONE PROPIONATE 50 MCG/ACT NA SUSP: 2.0000 | Freq: Every day | NASAL | Status: DC

## 2013-09-28 NOTE — Telephone Encounter (Signed)
Pt will plan to come in next Fri 10/07/13 if Dr L does not have an appt open either tomorrow or next Thurs. I advised pt sending in 2 week RF of both meds to cover until then and transferred to scheduling.

## 2013-10-10 ENCOUNTER — Other Ambulatory Visit: Payer: Self-pay | Admitting: Family Medicine

## 2013-11-14 ENCOUNTER — Ambulatory Visit (INDEPENDENT_AMBULATORY_CARE_PROVIDER_SITE_OTHER): Payer: Medicare Other | Admitting: Family Medicine

## 2013-11-14 ENCOUNTER — Encounter: Payer: Self-pay | Admitting: Family Medicine

## 2013-11-14 VITALS — BP 130/80 | HR 64 | Temp 98.0°F | Resp 16 | Ht 64.0 in | Wt 193.2 lb

## 2013-11-14 DIAGNOSIS — J45909 Unspecified asthma, uncomplicated: Secondary | ICD-10-CM

## 2013-11-14 DIAGNOSIS — I10 Essential (primary) hypertension: Secondary | ICD-10-CM | POA: Diagnosis not present

## 2013-11-14 DIAGNOSIS — F411 Generalized anxiety disorder: Secondary | ICD-10-CM

## 2013-11-14 DIAGNOSIS — J209 Acute bronchitis, unspecified: Secondary | ICD-10-CM | POA: Diagnosis not present

## 2013-11-14 DIAGNOSIS — J309 Allergic rhinitis, unspecified: Secondary | ICD-10-CM

## 2013-11-14 DIAGNOSIS — M109 Gout, unspecified: Secondary | ICD-10-CM

## 2013-11-14 DIAGNOSIS — R300 Dysuria: Secondary | ICD-10-CM

## 2013-11-14 DIAGNOSIS — M1990 Unspecified osteoarthritis, unspecified site: Secondary | ICD-10-CM

## 2013-11-14 LAB — POCT URINALYSIS DIPSTICK
Bilirubin, UA: NEGATIVE
Blood, UA: NEGATIVE
Glucose, UA: NEGATIVE
Ketones, UA: NEGATIVE
Leukocytes, UA: NEGATIVE
Nitrite, UA: NEGATIVE
Protein, UA: NEGATIVE
Spec Grav, UA: 1.015
Urobilinogen, UA: 0.2
pH, UA: 7.5

## 2013-11-14 LAB — CBC
HCT: 38.6 % (ref 36.0–46.0)
Hemoglobin: 12.9 g/dL (ref 12.0–15.0)
MCH: 30.4 pg (ref 26.0–34.0)
MCHC: 33.4 g/dL (ref 30.0–36.0)
MCV: 91 fL (ref 78.0–100.0)
Platelets: 276 10*3/uL (ref 150–400)
RBC: 4.24 MIL/uL (ref 3.87–5.11)
RDW: 14.7 % (ref 11.5–15.5)
WBC: 5.3 10*3/uL (ref 4.0–10.5)

## 2013-11-14 LAB — POCT UA - MICROSCOPIC ONLY
Casts, Ur, LPF, POC: NEGATIVE
Crystals, Ur, HPF, POC: NEGATIVE
Mucus, UA: NEGATIVE
Yeast, UA: NEGATIVE

## 2013-11-14 MED ORDER — LISINOPRIL-HYDROCHLOROTHIAZIDE 20-25 MG PO TABS: ORAL_TABLET | ORAL | Status: DC

## 2013-11-14 MED ORDER — ALPRAZOLAM 0.25 MG PO TABS: 0.2500 mg | ORAL_TABLET | Freq: Two times a day (BID) | ORAL | Status: DC | PRN

## 2013-11-14 MED ORDER — TRAMADOL HCL 50 MG PO TABS: 50.0000 mg | ORAL_TABLET | Freq: Three times a day (TID) | ORAL | Status: DC | PRN

## 2013-11-14 MED ORDER — AZITHROMYCIN 250 MG PO TABS: ORAL_TABLET | ORAL | Status: DC

## 2013-11-14 MED ORDER — ALBUTEROL SULFATE HFA 108 (90 BASE) MCG/ACT IN AERS: INHALATION_SPRAY | RESPIRATORY_TRACT | Status: DC

## 2013-11-14 MED ORDER — METHYLPREDNISOLONE ACETATE 80 MG/ML IJ SUSP
80.0000 mg | Freq: Once | INTRAMUSCULAR | Status: AC
  Administered 2013-11-14: 80 mg via INTRAMUSCULAR

## 2013-11-14 MED ORDER — FLUTICASONE PROPIONATE 50 MCG/ACT NA SUSP: 2.0000 | Freq: Every day | NASAL | Status: DC

## 2013-11-14 NOTE — Addendum Note (Signed)
Addended by: Mila MerryHARRELL, Tayquan Gassman on: 11/14/2013 02:15 PM   Modules accepted: Orders

## 2013-11-14 NOTE — Patient Instructions (Signed)
Bronchitis Bronchitis is inflammation of the airways that extend from the windpipe into the lungs (bronchi). The inflammation often causes mucus to develop, which leads to a cough. If the inflammation becomes severe, it may cause shortness of breath. CAUSES  Bronchitis may be caused by:   Viral infections.   Bacteria.   Cigarette smoke.   Allergens, pollutants, and other irritants.  SIGNS AND SYMPTOMS  The most common symptom of bronchitis is a frequent cough that produces mucus. Other symptoms include:  Fever.   Body aches.   Chest congestion.   Chills.   Shortness of breath.   Sore throat.  DIAGNOSIS  Bronchitis is usually diagnosed through a medical history and physical exam. Tests, such as chest X-rays, are sometimes done to rule out other conditions.  TREATMENT  You may need to avoid contact with whatever caused the problem (smoking, for example). Medicines are sometimes needed. These may include:  Antibiotics. These may be prescribed if the condition is caused by bacteria.  Cough suppressants. These may be prescribed for relief of cough symptoms.   Inhaled medicines. These may be prescribed to help open your airways and make it easier for you to breathe.   Steroid medicines. These may be prescribed for those with recurrent (chronic) bronchitis. HOME CARE INSTRUCTIONS  Get plenty of rest.   Drink enough fluids to keep your urine clear or pale yellow (unless you have a medical condition that requires fluid restriction). Increasing fluids may help thin your secretions and will prevent dehydration.   Only take over-the-counter or prescription medicines as directed by your health care provider.  Only take antibiotics as directed. Make sure you finish them even if you start to feel better.  Avoid secondhand smoke, irritating chemicals, and strong fumes. These will make bronchitis worse. If you are a smoker, quit smoking. Consider using nicotine gum or  skin patches to help control withdrawal symptoms. Quitting smoking will help your lungs heal faster.   Put a cool-mist humidifier in your bedroom at night to moisten the air. This may help loosen mucus. Change the water in the humidifier daily. You can also run the hot water in your shower and sit in the bathroom with the door closed for 5 10 minutes.   Follow up with your health care provider as directed.   Wash your hands frequently to avoid catching bronchitis again or spreading an infection to others.  SEEK MEDICAL CARE IF: Your symptoms do not improve after 1 week of treatment.  SEEK IMMEDIATE MEDICAL CARE IF:  Your fever increases.  You have chills.   You have chest pain.   You have worsening shortness of breath.   You have bloody sputum.  You faint.  You have lightheadedness.  You have a severe headache.   You vomit repeatedly. MAKE SURE YOU:   Understand these instructions.  Will watch your condition.  Will get help right away if you are not doing well or get worse. Document Released: 06/30/2005 Document Revised: 04/20/2013 Document Reviewed: 02/22/2013 [facility] Patient Information 2014 [employer], [place]. Gout Gout is an inflammatory arthritis caused by a buildup of uric acid crystals in the joints. Uric acid is a chemical that is normally present in the blood. When the level of uric acid in the blood is too high it can form crystals that deposit in your joints and tissues. This causes joint redness, soreness, and swelling (inflammation). Repeat attacks are common. Over time, uric acid crystals can form into masses (tophi) near a joint,  destroying bone and causing disfigurement. Gout is treatable and often preventable. CAUSES  The disease begins with elevated levels of uric acid in the blood. Uric acid is produced by your body when it breaks down a naturally found substance called purines. Certain foods you eat, such as meats and fish, contain high amounts of  purines. Causes of an elevated uric acid level include:  Being passed down from parent to child (heredity).  Diseases that cause increased uric acid production (such as obesity, psoriasis, and certain cancers).  Excessive alcohol use.  Diet, especially diets rich in meat and seafood.  Medicines, including certain cancer-fighting medicines (chemotherapy), water pills (diuretics), and aspirin.  Chronic kidney disease. The kidneys are no longer able to remove uric acid well.  Problems with metabolism. Conditions strongly associated with gout include:  Obesity.  High blood pressure.  High cholesterol.  Diabetes. Not everyone with elevated uric acid levels gets gout. It is not understood why some people get gout and others do not. Surgery, joint injury, and eating too much of certain foods are some of the factors that can lead to gout attacks. SYMPTOMS   An attack of gout comes on quickly. It causes intense pain with redness, swelling, and warmth in a joint.  Fever can occur.  Often, only one joint is involved. Certain joints are more commonly involved:  Base of the big toe.  Knee.  Ankle.  Wrist.  Finger. Without treatment, an attack usually goes away in a few days to weeks. Between attacks, you usually will not have symptoms, which is different from many other forms of arthritis. DIAGNOSIS  Your caregiver will suspect gout based on your symptoms and exam. In some cases, tests may be recommended. The tests may include:  Blood tests.  Urine tests.  X-rays.  Joint fluid exam. This exam requires a needle to remove fluid from the joint (arthrocentesis). Using a microscope, gout is confirmed when uric acid crystals are seen in the joint fluid. TREATMENT  There are two phases to gout treatment: treating the sudden onset (acute) attack and preventing attacks (prophylaxis).  Treatment of an Acute Attack.  Medicines are used. These include anti-inflammatory medicines or  steroid medicines.  An injection of steroid medicine into the affected joint is sometimes necessary.  The painful joint is rested. Movement can worsen the arthritis.  You may use warm or cold treatments on painful joints, depending which works best for you.  Treatment to Prevent Attacks.  If you suffer from frequent gout attacks, your caregiver may advise preventive medicine. These medicines are started after the acute attack subsides. These medicines either help your kidneys eliminate uric acid from your body or decrease your uric acid production. You may need to stay on these medicines for a very long time.  The early phase of treatment with preventive medicine can be associated with an increase in acute gout attacks. For this reason, during the first few months of treatment, your caregiver may also advise you to take medicines usually used for acute gout treatment. Be sure you understand your caregiver's directions. Your caregiver may make several adjustments to your medicine dose before these medicines are effective.  Discuss dietary treatment with your caregiver or dietitian. Alcohol and drinks high in sugar and fructose and foods such as meat, poultry, and seafood can increase uric acid levels. Your caregiver or dietician can advise you on drinks and foods that should be limited. HOME CARE INSTRUCTIONS   Do not take aspirin to relieve pain.  This raises uric acid levels.  Only take over-the-counter or prescription medicines for pain, discomfort, or fever as directed by your caregiver.  Rest the joint as much as possible. When in bed, keep sheets and blankets off painful areas.  Keep the affected joint raised (elevated).  Apply warm or cold treatments to painful joints. Use of warm or cold treatments depends on which works best for you.  Use crutches if the painful joint is in your leg.  Drink enough fluids to keep your urine clear or pale yellow. This helps your body get rid of uric  acid. Limit alcohol, sugary drinks, and fructose drinks.  Follow your dietary instructions. Pay careful attention to the amount of protein you eat. Your daily diet should emphasize fruits, vegetables, whole grains, and fat-free or low-fat milk products. Discuss the use of coffee, vitamin C, and cherries with your caregiver or dietician. These may be helpful in lowering uric acid levels.  Maintain a healthy body weight. SEEK MEDICAL CARE IF:   You develop diarrhea, vomiting, or any side effects from medicines.  You do not feel better in 24 hours, or you are getting worse. SEEK IMMEDIATE MEDICAL CARE IF:   Your joint becomes suddenly more tender, and you have chills or a fever. MAKE SURE YOU:   Understand these instructions.  Will watch your condition.  Will get help right away if you are not doing well or get worse. Document Released: 06/27/2000 Document Revised: 10/25/2012 Document Reviewed: 02/11/2012 [facility]Exit[facility] Patient Information 2014 [place], [place].

## 2013-11-14 NOTE — Progress Notes (Signed)
Patient ID: Amy Gallagher, female   DOB: Feb 09, 1930, 80 y.o.   MRN: [id number]   Patient ID: Amy HeightLouise D Gallagher MRN: [id number], DOB: Feb 09, 1930, 80 y.o. Date of Encounter: 11/14/2013, 1:32 PM  Primary Physician: User Centricity, MD  Chief Complaint: sore throat  HPI: 80 y.o. year old female with history below presents with sore throat that began about 9 days ago. Pt reports an associated cough, wheezing and congestion. She denies fever and chills. Pt states she has terrible seasonal allergies and has had bronchitis. She states she took a few OTC medications to help with her allergies with minimal results.  She states she needs all her medications refilled. She also reports she'd like something for her gout. She notes she'd like to try something other than naproxen for her arthritic pain. She states she's tired of hurting.    Past Medical History  Diagnosis Date   Gout    Arthritis    Bronchitis      Home Meds: Prior to Admission medications   Medication Sig Start Date End Date Taking? Authorizing Provider  albuterol (PROAIR HFA) 108 (90 BASE) MCG/ACT inhaler Inhale 2 puffs by mouth every 4-6 hours as needed. PATIENT NEEDS OFFICE VISIT FOR ADDITIONAL REFILLS 07/04/13  Yes Elvina SidleKurt Lauenstein, MD  ALPRAZolam Prudy Feeler(XANAX) 0.25 MG tablet TAKE 1/2 TABLET BY MOUTH TWICE A DAY AS NEEDED SLEEP 05/30/12  Yes Elvina SidleKurt Lauenstein, MD  fluticasone [facility](FLONASE) 50 MCG/ACT nasal spray Place 2 sprays into both nostrils daily. PATIENT NEEDS OFFICE VISIT FOR ADDITIONAL REFILLS 09/28/13  Yes Elvina SidleKurt Lauenstein, MD  HYDROcodone-acetaminophen (NORCO) 5-325 MG per tablet Take 1 tablet by mouth every 6 (six) hours as needed for moderate pain. 05/23/13  Yes Shanna CiscoMegan E Docherty, MD  lisinopril-hydrochlorothiazide (PRINZIDE,ZESTORETIC) 20-25 MG per tablet TAKE 1 TABLET BY MOUTH DAILY. 07/11/12  Yes Heather M Marte, PA-C  lisinopril-hydrochlorothiazide (PRINZIDE,ZESTORETIC) 20-25 MG per tablet Take 1 tablet by mouth daily. PATIENT NEEDS  OFFICE VISIT FOR ADDITIONAL REFILLS - FINAL NOTICE 09/28/13  Yes Elvina SidleKurt Lauenstein, MD  LORazepam (ATIVAN) 0.5 MG tablet TAKE 1 TABLET TWICE A DAY AS NEEDED FOR ANXIETY 06/02/13  Yes Elvina SidleKurt Lauenstein, MD  Multiple Vitamin (MULTIVITAMIN WITH MINERALS) TABS tablet Take 1 tablet by mouth daily.   Yes Historical Provider, MD  naproxen (NAPROSYN) 500 MG tablet Take 1 tablet (500 mg total) by mouth 2 (two) times daily with a meal. 01/05/12  Yes Chelle S Jeffery, PA-C  NEXIUM 20 MG packet TAKE ONE AS DIRECTED BY MOUTH ONCE DAILY 04/06/11  Yes Mardella Laymanavid R Patterson, MD  polyethylene glycol powder (GLYCOLAX/MIRALAX) powder MIX 17 GM IN 8 OUNCES AS DIRECTED 04/19/12  Yes Morrell RiddleSarah L Weber, PA-C  traMADol (ULTRAM) 50 MG tablet TAKE 1 TABLET EVERY 8 HOURS 07/15/13  Yes Elvina SidleKurt Lauenstein, MD  traMADol (ULTRAM) 50 MG tablet Take 1 tablet (50 mg total) by mouth 3 (three) times daily as needed. 07/18/13  Yes Eleanore Delia ChimesE Egan, PA-C  vitamin C (ASCORBIC ACID) 500 MG tablet Take 500 mg by mouth daily.   Yes Historical Provider, MD  fexofenadine (ALLEGRA) 180 MG tablet Take 1 tablet (180 mg total) by mouth daily. 08/14/11 05/23/13  Elvina SidleKurt Lauenstein, MD    Allergies:  Allergies  Allergen Reactions   Aspirin Nausea And Vomiting   Penicillins Swelling    History   Social History   Marital Status: Widowed    Spouse Name: N/A    Number of Children: N/A   Years of Education: N/A   Occupational History  Not on file.   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  Patient's husband is deceased.       Review of Systems: cough, wheezing, congestion, sore throat, environmental allergies, flare up of gout,  Constitutional: negative for chills,  fever, night sweats, weight changes, or fatigue  HEENT: negative for vision changes, hearing loss, rhinorrhea, ST, epistaxis, or sinus pressure Cardiovascular: negative for chest pain or palpitations Respiratory: negative for hemoptysis, wheezing, shortness of breath, or cough Abdominal: negative for abdominal pain, nausea, vomiting, diarrhea, or constipation Dermatological: negative for rash Neurologic: negative for headache, dizziness, or syncope All other systems reviewed and are otherwise negative with the exception to those above and in the HPI.   Physical Exam: Blood pressure 130/80, pulse 64, temperature 98 F (36.7 C), temperature source Oral, resp. rate 16, height 5\' 4"  (1.626 m), weight 193 lb 3.2 oz (87.635 kg), SpO2 99.00%., Body mass index is 33.15 kg/(m^2). General: Well developed, well nourished, in no acute distress. Head: Normocephalic, atraumatic, eyes without discharge, sclera non-icteric, nares are without discharge. Bilateral auditory canals clear, TM's are without perforation, pearly grey and translucent with reflective cone of light bilaterally. Oral cavity moist, posterior pharynx without exudate, erythema, peritonsillar abscess, or post nasal drip.  Neck: Supple. No thyromegaly. Full ROM. No lymphadenopathy. Lungs: Clear bilaterally to auscultation without wheezes, rales, or rhonchi. Breathing is unlabored. Heart: RRR with S1 S2. No murmurs, rubs, or gallops appreciated. Abdomen: Soft, non-tender, non-distended with normoactive bowel sounds. No hepatomegaly. No rebound/guarding. No obvious abdominal masses. Msk:  Strength and tone normal for age. Extremities/Skin: Warm and dry. No clubbing or cyanosis. No edema. No rashes or suspicious lesions. Neuro: Alert and oriented X 3. Moves all extremities spontaneously. Gait is normal. CNII-XII grossly in tact. Psych:  Responds to questions appropriately with a normal affect.    ASSESSMENT AND PLAN:  The primary encounter  diagnosis was Acute bronchitis. Diagnoses of Asthma, chronic, Gout, Osteoarthritis, Hypertension, Allergic rhinitis, and Anxiety state, unspecified were also pertinent to this visit.  Acute bronchitis - Plan: albuterol (PROAIR HFA) 108 (90 BASE) MCG/ACT inhaler, methylPREDNISolone acetate (DEPO-MEDROL) injection 80 mg  Asthma, chronic - Plan: albuterol (PROAIR HFA) 108 (90 BASE) MCG/ACT inhaler, methylPREDNISolone acetate (DEPO-MEDROL) injection 80 mg  Gout - Plan: Uric Acid  Osteoarthritis - Plan: traMADol (ULTRAM) 50 MG tablet  Hypertension - Plan: lisinopril-hydrochlorothiazide (PRINZIDE,ZESTORETIC) 20-25 MG per tablet  Allergic rhinitis - Plan: fluticasone (FLONASE) 50 MCG/ACT nasal spray  Anxiety state, unspecified - Plan: ALPRAZolam (XANAX) 0.25 MG tablet, azithromycin (ZITHROMAX Z-PAK) 250 MG tablet    Signed, Elvina SidleKurt Lauenstein, MD 11/14/2013 1:32 PM

## 2013-11-15 ENCOUNTER — Telehealth: Payer: Self-pay

## 2013-11-15 LAB — COMPREHENSIVE METABOLIC PANEL
ALT: 16 U/L (ref 0–35)
AST: 21 U/L (ref 0–37)
Albumin: 4.2 g/dL (ref 3.5–5.2)
Alkaline Phosphatase: 60 U/L (ref 39–117)
BUN: 17 mg/dL (ref 6–23)
CO2: 30 mEq/L (ref 19–32)
Calcium: 10.1 mg/dL (ref 8.4–10.5)
Chloride: 102 mEq/L (ref 96–112)
Creat: 0.93 mg/dL (ref 0.50–1.10)
Glucose, Bld: 69 mg/dL — ABNORMAL LOW (ref 70–99)
Potassium: 4.5 mEq/L (ref 3.5–5.3)
Sodium: 140 mEq/L (ref 135–145)
Total Bilirubin: 0.5 mg/dL (ref 0.2–1.2)
Total Protein: 7.4 g/dL (ref 6.0–8.3)

## 2013-11-15 LAB — LIPID PANEL
Cholesterol: 145 mg/dL (ref 0–200)
HDL: 51 mg/dL (ref >39)
LDL Cholesterol: 81 mg/dL (ref 0–99)
Total CHOL/HDL Ratio: 2.8 Ratio
Triglycerides: 63 mg/dL (ref <150)
VLDL: 13 mg/dL (ref 0–40)

## 2013-11-15 LAB — URIC ACID: Uric Acid, Serum: 8.5 mg/dL — ABNORMAL HIGH (ref 2.4–7.0)

## 2013-11-15 MED ORDER — DICLOFENAC SODIUM 50 MG PO TBEC: 50.0000 mg | DELAYED_RELEASE_TABLET | Freq: Two times a day (BID) | ORAL | Status: DC

## 2013-11-15 MED ORDER — ALLOPURINOL 100 MG PO TABS: 100.0000 mg | ORAL_TABLET | Freq: Every day | ORAL | Status: DC

## 2013-11-15 NOTE — Telephone Encounter (Signed)
Spoke with pt. She wants to know if we can send in Voltaren too to help with the pain until the allopurinol kicks in

## 2013-11-15 NOTE — Telephone Encounter (Signed)
See other phone message  

## 2013-11-15 NOTE — Telephone Encounter (Signed)
Rx for allopurinol sent to pharmacy. I advise her to repeat uric acid level in 3-6 months

## 2013-11-15 NOTE — Telephone Encounter (Signed)
Patient called stated she will like for Votlaren to be called into [facility] [address] Patient stated she is in pain. Knees and ankles are bothering her. [phone]

## 2013-11-15 NOTE — Telephone Encounter (Signed)
Pt saw Dr. Elbert EwingsL yesterday and he was supposed to call in Voltaren for her arthritis pain.  This was not called in or given to her.  Please call [phone]

## 2013-11-15 NOTE — Telephone Encounter (Signed)
She states that DR. L was supposed to send in her medication to keep the uric acid levels down to prevent gout and something for her knee pain (possibly voltaren).  She said Dr. Elbert EwingsL wants her to take the medication for one year. Dr. Elbert EwingsL is out of the office until the 12th.  Please advise. [facility] [address]

## 2013-11-15 NOTE — Telephone Encounter (Signed)
Voltaren sent to pharmacy

## 2013-11-16 ENCOUNTER — Telehealth: Payer: Self-pay | Admitting: Family Medicine

## 2013-11-16 NOTE — Telephone Encounter (Signed)
Called Melanie back and reqd that she fx an order for specific supplies pt needs so that Dr L can sign. She agreed.

## 2013-11-16 NOTE — Telephone Encounter (Signed)
Spoke to patient and advised her that Voltaren was called to pharmacy. She was very excited to hear this.

## 2013-11-16 NOTE — Telephone Encounter (Signed)
Melanie from [facility] states that patient needs more incontinent supplies and needs a script faxed. [phone]  [phone]

## 2013-11-17 NOTE — Telephone Encounter (Signed)
Form received and put in Dr Cain SaupeL's box.

## 2013-11-18 NOTE — Telephone Encounter (Signed)
Sarah, signed for supplies and faxed w/confirmation. Scanned.

## 2013-11-18 NOTE — Telephone Encounter (Signed)
I will be back Tuesday.  If you can write the prescription for the supplies, I will sign it when I get back.  Another possible solution would be to have a PA sign the prescription, since this does not involve any risk to the patient.

## 2013-11-24 ENCOUNTER — Telehealth: Payer: Self-pay

## 2013-11-24 NOTE — Telephone Encounter (Signed)
Pt faxed form from [employer], appl for Residential Director Assistance Exemption. I have put it at Dr Cain SaupeL's computer for review.

## 2013-11-25 NOTE — Telephone Encounter (Signed)
Dr L asked me to advise pt that he needs more info about this request. It appears she wants to be exempt from a directory assist service from [employer] due to problem w/ "control of hands". If this is the case, Dr L will need to see pt bc he has no documentation of eval for problems w/dexterity. LMOM with this message and also faxed back the form w/this message. Form is in alphabetical pending forms folder at nurses station.

## 2013-11-30 NOTE — Telephone Encounter (Signed)
Pt called back and agreed to come see Dr L to eval her manual dexterity in order to complete exemption form. Form is in alphabetical patient paperwork folder at nurses station.

## 2013-12-16 ENCOUNTER — Other Ambulatory Visit: Payer: Self-pay | Admitting: Family Medicine

## 2014-01-13 ENCOUNTER — Other Ambulatory Visit: Payer: Self-pay | Admitting: Family Medicine

## 2014-01-30 ENCOUNTER — Other Ambulatory Visit: Payer: Self-pay | Admitting: Family Medicine

## 2014-01-31 ENCOUNTER — Other Ambulatory Visit: Payer: Self-pay | Admitting: Family Medicine

## 2014-02-02 NOTE — Telephone Encounter (Signed)
Faxed

## 2014-02-23 ENCOUNTER — Telehealth: Payer: Self-pay

## 2014-02-23 NOTE — Telephone Encounter (Addendum)
Belinda called in and states a fax was sent to our facility and states that Amy Gallagher needs a back and knee brace. She sees Dr Milus GlazierLauenstein. She requests him to fill out the fax and fax back asap. Mrs Amy Gallagher was on the phone and approved the request..Marland Kitchen.Thank you

## 2014-02-24 NOTE — Telephone Encounter (Signed)
PPW is in Dr Cain SaupeL's box to complete when pt comes in for eval.

## 2014-02-24 NOTE — Telephone Encounter (Signed)
Belinda called back asking if the paperwork had been filled out yet.   Unable to fill out paperwork until pt has an OV in regards to this brace. LM and notified pt and Belinda states she will also contact the pt to schedule an appt.

## 2014-02-27 ENCOUNTER — Telehealth: Payer: Self-pay

## 2014-02-27 NOTE — Telephone Encounter (Signed)
Received call from Belinda at [facility] [phone].she states they have sent our office a form for the doctor review for a knee brace and a back brace. The form for the knee brace was completed and returned to them, but they have not received anything regarding the back brace. Please call Belinda to advise

## 2014-02-27 NOTE — Telephone Encounter (Signed)
Only received one form.  LM for Belinda to refax the form. LM on pt VM to come in for an OV in regards to this.

## 2014-03-02 NOTE — Telephone Encounter (Signed)
I put form in Dr Cain SaupeL's box.

## 2014-03-02 NOTE — Telephone Encounter (Signed)
Received another form for back brace order. Dr L, you may be able to fill this out from 11/14/13 OV eval of arthritis if you discussed back pain then, but may need to do an addendum, if so to document specific back (and knee) problems?

## 2014-03-09 ENCOUNTER — Telehealth: Payer: Self-pay | Admitting: *Deleted

## 2014-03-09 NOTE — Telephone Encounter (Signed)
Spoke to pt- medical records has received many request for records within the last 6 months pertaining to pt complaint of back and knee pain. Pt has not been evaluated for this since 2013. Advised pt to RTC this weekend to see Dr. Milus Glazier for evaluation and notes to be written. We will send these forms in as soon as these are available.

## 2014-03-11 ENCOUNTER — Other Ambulatory Visit: Payer: Self-pay | Admitting: Family Medicine

## 2014-03-14 ENCOUNTER — Other Ambulatory Visit: Payer: Self-pay | Admitting: Family Medicine

## 2014-03-14 NOTE — Telephone Encounter (Signed)
Dr L, you saw pt in May, but not for this med. Can we give RFs?

## 2014-03-15 ENCOUNTER — Other Ambulatory Visit: Payer: Self-pay | Admitting: Family Medicine

## 2014-03-16 ENCOUNTER — Other Ambulatory Visit: Payer: Self-pay | Admitting: Family Medicine

## 2014-03-16 NOTE — Telephone Encounter (Signed)
At last OV 11/14/13, pt req'd some med other than naproxen for arthritis, and you Rx'd Tramadol. Did you intend to leave her on naproxen also and want to RF, or DC naproxen?

## 2014-03-21 ENCOUNTER — Other Ambulatory Visit: Payer: Self-pay | Admitting: Family Medicine

## 2014-03-24 ENCOUNTER — Ambulatory Visit (INDEPENDENT_AMBULATORY_CARE_PROVIDER_SITE_OTHER): Payer: Medicare Other | Admitting: Family Medicine

## 2014-03-24 ENCOUNTER — Encounter: Payer: Self-pay | Admitting: Family Medicine

## 2014-03-24 VITALS — BP 156/70 | HR 65 | Temp 98.1°F | Resp 16 | Ht 64.0 in | Wt 195.0 lb

## 2014-03-24 DIAGNOSIS — J069 Acute upper respiratory infection, unspecified: Secondary | ICD-10-CM

## 2014-03-24 DIAGNOSIS — M1990 Unspecified osteoarthritis, unspecified site: Secondary | ICD-10-CM

## 2014-03-24 DIAGNOSIS — G5600 Carpal tunnel syndrome, unspecified upper limb: Secondary | ICD-10-CM | POA: Diagnosis not present

## 2014-03-24 DIAGNOSIS — M1610 Unilateral primary osteoarthritis, unspecified hip: Secondary | ICD-10-CM | POA: Diagnosis not present

## 2014-03-24 DIAGNOSIS — M129 Arthropathy, unspecified: Secondary | ICD-10-CM

## 2014-03-24 DIAGNOSIS — G5603 Carpal tunnel syndrome, bilateral upper limbs: Secondary | ICD-10-CM

## 2014-03-24 DIAGNOSIS — J209 Acute bronchitis, unspecified: Secondary | ICD-10-CM | POA: Diagnosis not present

## 2014-03-24 DIAGNOSIS — M1612 Unilateral primary osteoarthritis, left hip: Secondary | ICD-10-CM

## 2014-03-24 MED ORDER — TRAMADOL HCL 50 MG PO TABS: 50.0000 mg | ORAL_TABLET | Freq: Two times a day (BID) | ORAL | Status: DC | PRN

## 2014-03-24 MED ORDER — METHYLPREDNISOLONE ACETATE 80 MG/ML IJ SUSP
80.0000 mg | Freq: Once | INTRAMUSCULAR | Status: AC
  Administered 2014-03-24: 80 mg via INTRAMUSCULAR

## 2014-03-24 MED ORDER — PREDNISONE 20 MG PO TABS: 20.0000 mg | ORAL_TABLET | Freq: Every day | ORAL | Status: DC

## 2014-03-24 MED ORDER — AZITHROMYCIN 250 MG PO TABS: ORAL_TABLET | ORAL | Status: DC

## 2014-03-24 NOTE — Progress Notes (Signed)
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 Pt reports an associated cough, wheezing and congestion.  She also has had left hip pain radiating down the side of left leg to the knee and radiating into the left groin    Objective:  NAD HEENT:  Mild erythema throat, normal TM's ,  Neck: supple, no adenopathy Chest:  Few expiratory wheezes Heart:  Reg, no murmur Ext:  Pain with internal and external rotation of left hip, tender trochanter. Some wasting of thenar eminence bilaterally Heberden's nodes of fingers Numbness of both hands   URI, acute - Plan: azithromycin (ZITHROMAX Z-PAK) 250 MG tablet  Acute bronchitis, unspecified organism - Plan: azithromycin (ZITHROMAX Z-PAK) 250 MG tablet  Arthritis of left hip - Plan: methylPREDNISolone acetate (DEPO-MEDROL) injection 80 mg, Ambulatory referral to Home Health  Bilateral carpal tunnel syndrome - Plan: Ambulatory referral to Home Health, Ambulatory referral to Orthopedic Surgery  Signed, Elvina Sidle, MD

## 2014-03-24 NOTE — Telephone Encounter (Signed)
Called in.

## 2014-03-28 ENCOUNTER — Other Ambulatory Visit: Payer: Self-pay | Admitting: Family Medicine

## 2014-03-28 ENCOUNTER — Telehealth: Payer: Self-pay

## 2014-03-28 DIAGNOSIS — M25559 Pain in unspecified hip: Secondary | ICD-10-CM

## 2014-03-28 NOTE — Telephone Encounter (Signed)
I will refer to orhtopedics

## 2014-03-28 NOTE — Telephone Encounter (Signed)
Pt of Dr.L advised to call in if the pain in her hips and knees were still bothering her. She says that when she woke up today getting around was very difficult. She is still taking the prednisone, but would like to know if something else would take the edge off of her pain. Also, she said that forms for a discount on her medications was placed with Doristine Johns, she would like to know the status on this.

## 2014-03-28 NOTE — Telephone Encounter (Signed)
Spoke to pt- she states the medication is not helping with hip pain. She is having trouble getting up from a standing position to get to the bathroom. Pt needs something stronger than the tramadol/naprosen.  Please advise.

## 2014-03-29 ENCOUNTER — Other Ambulatory Visit: Payer: Self-pay | Admitting: Family Medicine

## 2014-03-29 ENCOUNTER — Telehealth: Payer: Self-pay

## 2014-03-29 MED ORDER — HYDROCODONE-ACETAMINOPHEN 5-325 MG PO TABS
1.0000 | ORAL_TABLET | Freq: Four times a day (QID) | ORAL | Status: DC | PRN
Start: 2014-03-29 — End: 2015-11-08

## 2014-03-29 NOTE — Telephone Encounter (Signed)
Dr. Elbert Ewings - Patient says she is hurting so bad that she can't stand it.  She says she was told yesterday to take ibuprofen, but you had told her not to take that due to another medication that she is on.  She wants some type of pain medication called in for her.  [phone]

## 2014-03-29 NOTE — Telephone Encounter (Signed)
Pt advised ortho referral and is going to try to take it easy until she gets to the specialist.

## 2014-03-29 NOTE — Telephone Encounter (Signed)
Pt wants something stronger while waiting for Ortho appt. Please advise.

## 2014-03-29 NOTE — Telephone Encounter (Signed)
Dr. Elbert Ewings rx'ed Norco. Pt notified that it is up front for her to p/u

## 2014-03-30 ENCOUNTER — Telehealth: Payer: Self-pay

## 2014-03-30 NOTE — Telephone Encounter (Signed)
Lashaun @ [facility] wanted to let Dr Milus Glazier know they received the order for patient to have physical therapy. Per lashaun they are unable to start her therapy this week and it will be delayed until Monday or Tuesday or next week. If our office has any questions please contact Lashaun at [phone]

## 2014-03-31 ENCOUNTER — Telehealth: Payer: Self-pay

## 2014-03-31 NOTE — Telephone Encounter (Signed)
Patient calling to let our office know she is having her granddaughter "Marquette Old" pick up her pain medication that is ready up front. Any questions please call patient at [phone]

## 2014-04-06 ENCOUNTER — Ambulatory Visit: Payer: Medicare Other | Admitting: Family Medicine

## 2014-04-11 ENCOUNTER — Telehealth: Payer: Self-pay

## 2014-04-11 NOTE — Telephone Encounter (Signed)
Therapy for patient has been delayed again.  May not be able to proceed with patient.

## 2014-04-20 NOTE — Telephone Encounter (Signed)
It was Lashausa from [facility] who called. Called her back and she clarified that the pt hadn't been at home when the initial start of care visit was scheduled. They have rescheduled for tomorrow morning and she will let us know whether pt allows visit this time.

## 2014-04-20 NOTE — Telephone Encounter (Signed)
Dr L, I do not know for sure who left this message, but appears to be from [facility] (see message from 03/30/14). This is FYI.

## 2014-04-20 NOTE — Telephone Encounter (Signed)
Upon further review, I determined that it was La

## 2014-04-21 DIAGNOSIS — G5601 Carpal tunnel syndrome, right upper limb: Secondary | ICD-10-CM | POA: Diagnosis not present

## 2014-04-21 DIAGNOSIS — G5602 Carpal tunnel syndrome, left upper limb: Secondary | ICD-10-CM | POA: Diagnosis not present

## 2014-04-21 DIAGNOSIS — I10 Essential (primary) hypertension: Secondary | ICD-10-CM | POA: Diagnosis not present

## 2014-04-21 DIAGNOSIS — M1990 Unspecified osteoarthritis, unspecified site: Secondary | ICD-10-CM | POA: Diagnosis not present

## 2014-04-21 DIAGNOSIS — Z9181 History of falling: Secondary | ICD-10-CM | POA: Diagnosis not present

## 2014-04-21 DIAGNOSIS — M6281 Muscle weakness (generalized): Secondary | ICD-10-CM | POA: Diagnosis not present

## 2014-05-02 ENCOUNTER — Other Ambulatory Visit: Payer: Self-pay | Admitting: Family Medicine

## 2014-05-02 DIAGNOSIS — I10 Essential (primary) hypertension: Secondary | ICD-10-CM | POA: Diagnosis not present

## 2014-05-02 DIAGNOSIS — M6281 Muscle weakness (generalized): Secondary | ICD-10-CM | POA: Diagnosis not present

## 2014-05-02 DIAGNOSIS — Z9181 History of falling: Secondary | ICD-10-CM | POA: Diagnosis not present

## 2014-05-02 DIAGNOSIS — G5602 Carpal tunnel syndrome, left upper limb: Secondary | ICD-10-CM | POA: Diagnosis not present

## 2014-05-02 DIAGNOSIS — G5601 Carpal tunnel syndrome, right upper limb: Secondary | ICD-10-CM | POA: Diagnosis not present

## 2014-05-02 DIAGNOSIS — M1990 Unspecified osteoarthritis, unspecified site: Secondary | ICD-10-CM | POA: Diagnosis not present

## 2014-05-03 ENCOUNTER — Telehealth: Payer: Self-pay

## 2014-05-03 NOTE — Telephone Encounter (Signed)
Maggie from [facility] calling to check status of addendum for pt. Seward GraterMaggie says she spoke with Jill SideAlison last, and she can be reached at [phone] extension  [phone]. She said the initial request was on 10/2

## 2014-05-11 DIAGNOSIS — G5601 Carpal tunnel syndrome, right upper limb: Secondary | ICD-10-CM | POA: Diagnosis not present

## 2014-05-11 DIAGNOSIS — I10 Essential (primary) hypertension: Secondary | ICD-10-CM | POA: Diagnosis not present

## 2014-05-11 DIAGNOSIS — G5602 Carpal tunnel syndrome, left upper limb: Secondary | ICD-10-CM | POA: Diagnosis not present

## 2014-05-11 DIAGNOSIS — M1990 Unspecified osteoarthritis, unspecified site: Secondary | ICD-10-CM | POA: Diagnosis not present

## 2014-05-11 DIAGNOSIS — M6281 Muscle weakness (generalized): Secondary | ICD-10-CM | POA: Diagnosis not present

## 2014-05-11 DIAGNOSIS — Z9181 History of falling: Secondary | ICD-10-CM | POA: Diagnosis not present

## 2014-05-17 ENCOUNTER — Telehealth: Payer: Self-pay

## 2014-05-17 NOTE — Telephone Encounter (Signed)
Wess BottsJeanette Littlejohn from [facility] calling to say that one piece of a document was not completed on  A form we sent. She will be refaxing this today to be properly completed. CB # [phone]

## 2014-05-18 DIAGNOSIS — G5601 Carpal tunnel syndrome, right upper limb: Secondary | ICD-10-CM | POA: Diagnosis not present

## 2014-05-18 DIAGNOSIS — M1990 Unspecified osteoarthritis, unspecified site: Secondary | ICD-10-CM | POA: Diagnosis not present

## 2014-05-18 DIAGNOSIS — Z9181 History of falling: Secondary | ICD-10-CM | POA: Diagnosis not present

## 2014-05-18 DIAGNOSIS — G5602 Carpal tunnel syndrome, left upper limb: Secondary | ICD-10-CM | POA: Diagnosis not present

## 2014-05-18 DIAGNOSIS — I10 Essential (primary) hypertension: Secondary | ICD-10-CM | POA: Diagnosis not present

## 2014-05-18 DIAGNOSIS — M6281 Muscle weakness (generalized): Secondary | ICD-10-CM | POA: Diagnosis not present

## 2014-05-22 ENCOUNTER — Telehealth: Payer: Self-pay | Admitting: *Deleted

## 2014-05-22 NOTE — Telephone Encounter (Signed)
Faxed completed Personal Care [employer] ICD-10 Transition form, per Dr Patsy Lageropland. Confirmation page received at 8:42 am.

## 2014-06-03 ENCOUNTER — Other Ambulatory Visit: Payer: Self-pay | Admitting: Family Medicine

## 2014-06-04 NOTE — Telephone Encounter (Signed)
Faxed

## 2014-06-19 ENCOUNTER — Other Ambulatory Visit: Payer: Self-pay | Admitting: Family Medicine

## 2014-06-19 ENCOUNTER — Telehealth: Payer: Self-pay

## 2014-06-19 NOTE — Telephone Encounter (Signed)
I checked Dr Cain SaupeL's box and the order is in his box for completion. Dr L, please bring form to Huntley DecSara or myself when completed so that we can notify pt.

## 2014-06-19 NOTE — Telephone Encounter (Signed)
Patient is requesting a refill on her depends. [facility] had sent a request last Monday and sent forms through fax. Patient received wrong size and is needing a new order put in. I asked her to resend request just in case. Please advise.    [phone]

## 2014-07-05 ENCOUNTER — Telehealth: Payer: Self-pay

## 2014-07-05 DIAGNOSIS — M1711 Unilateral primary osteoarthritis, right knee: Secondary | ICD-10-CM | POA: Diagnosis not present

## 2014-07-05 DIAGNOSIS — M1712 Unilateral primary osteoarthritis, left knee: Secondary | ICD-10-CM | POA: Diagnosis not present

## 2014-07-05 NOTE — Telephone Encounter (Signed)
Patient requesting "Alopurinol 100 mg" to be [facility] [address]. Patient completely out and requesting if our office could please call [facility] as soon as possible to authorize another refill. Patients call back number is [phone]

## 2014-07-06 MED ORDER — ALLOPURINOL 100 MG PO TABS: 100.0000 mg | ORAL_TABLET | Freq: Every day | ORAL | Status: DC

## 2014-07-06 NOTE — Telephone Encounter (Signed)
Refill sent to the pharmacy per protocol. LM to advise pt.

## 2014-07-20 ENCOUNTER — Other Ambulatory Visit: Payer: Self-pay | Admitting: Family Medicine

## 2014-07-20 DIAGNOSIS — I10 Essential (primary) hypertension: Secondary | ICD-10-CM

## 2014-07-20 MED ORDER — HYDROCHLOROTHIAZIDE 25 MG PO TABS: 25.0000 mg | ORAL_TABLET | Freq: Every day | ORAL | Status: DC

## 2014-07-21 DIAGNOSIS — Z23 Encounter for immunization: Secondary | ICD-10-CM | POA: Diagnosis not present

## 2014-08-02 ENCOUNTER — Other Ambulatory Visit: Payer: Self-pay | Admitting: Family Medicine

## 2014-08-03 NOTE — Telephone Encounter (Signed)
Dr L, you saw pt in Sept for acute issue, but she hasn't had check up since May. You just gave pt 1 year of RFs on HCTZ, so wanted to see if you wanted to RF pantoprazole (other chronic meds) for the same time period or if pt needs OV now?

## 2014-08-30 ENCOUNTER — Other Ambulatory Visit: Payer: Self-pay | Admitting: Physician Assistant

## 2014-10-04 DIAGNOSIS — M1712 Unilateral primary osteoarthritis, left knee: Secondary | ICD-10-CM | POA: Diagnosis not present

## 2014-10-04 DIAGNOSIS — M1711 Unilateral primary osteoarthritis, right knee: Secondary | ICD-10-CM | POA: Diagnosis not present

## 2014-10-24 ENCOUNTER — Other Ambulatory Visit: Payer: Self-pay | Admitting: Physician Assistant

## 2014-11-28 ENCOUNTER — Other Ambulatory Visit: Payer: Self-pay | Admitting: Family Medicine

## 2014-12-09 ENCOUNTER — Telehealth: Payer: Self-pay

## 2014-12-09 ENCOUNTER — Other Ambulatory Visit: Payer: Self-pay | Admitting: Family Medicine

## 2014-12-09 NOTE — Telephone Encounter (Signed)
Patient would like a refill of albuterol (PROAIR HFA) 108 (90 BASE) MCG/ACT inhaler.  She said she needs to establish care with another provider, but until she is able to come in for that OV, she would like a refill of her medication.  [facility] [address]  CB#: [phone]

## 2014-12-11 NOTE — Telephone Encounter (Signed)
Rx sent 

## 2014-12-18 ENCOUNTER — Other Ambulatory Visit: Payer: Self-pay | Admitting: Family Medicine

## 2014-12-28 ENCOUNTER — Other Ambulatory Visit: Payer: Self-pay | Admitting: Family Medicine

## 2015-01-03 DIAGNOSIS — M1712 Unilateral primary osteoarthritis, left knee: Secondary | ICD-10-CM | POA: Diagnosis not present

## 2015-01-03 DIAGNOSIS — M1711 Unilateral primary osteoarthritis, right knee: Secondary | ICD-10-CM | POA: Diagnosis not present

## 2015-01-16 ENCOUNTER — Encounter: Payer: Self-pay | Admitting: Gastroenterology

## 2015-01-30 ENCOUNTER — Other Ambulatory Visit: Payer: Self-pay | Admitting: Family Medicine

## 2015-01-30 ENCOUNTER — Telehealth: Payer: Self-pay

## 2015-01-30 NOTE — Telephone Encounter (Signed)
Rx for Tramadol faxed. 

## 2015-02-08 ENCOUNTER — Other Ambulatory Visit: Payer: Self-pay | Admitting: Family Medicine

## 2015-03-05 ENCOUNTER — Other Ambulatory Visit: Payer: Self-pay | Admitting: Family Medicine

## 2015-03-10 ENCOUNTER — Other Ambulatory Visit: Payer: Self-pay | Admitting: Family Medicine

## 2015-04-17 ENCOUNTER — Other Ambulatory Visit: Payer: Self-pay | Admitting: Family Medicine

## 2015-04-17 MED ORDER — ALLOPURINOL 100 MG PO TABS: 100.0000 mg | ORAL_TABLET | Freq: Every day | ORAL | Status: DC

## 2015-04-18 ENCOUNTER — Other Ambulatory Visit: Payer: Self-pay

## 2015-04-18 MED ORDER — NAPROXEN 500 MG PO TABS: ORAL_TABLET | ORAL | Status: DC

## 2015-04-19 ENCOUNTER — Other Ambulatory Visit: Payer: Self-pay

## 2015-04-19 NOTE — Telephone Encounter (Signed)
Duplicate

## 2015-05-11 ENCOUNTER — Other Ambulatory Visit: Payer: Self-pay

## 2015-05-11 NOTE — Telephone Encounter (Signed)
Patient is calling because a she is changing insurances and the insurance company advised that she get her current medication refilled by Monday because she won't have her card until sometime next week. Patient needs a refill for albuterol,nexium,naproxen,tramadol, and alpano(???). [facility] [address]

## 2015-05-14 ENCOUNTER — Other Ambulatory Visit: Payer: Self-pay | Admitting: Physician Assistant

## 2015-05-14 MED ORDER — ESOMEPRAZOLE MAGNESIUM 20 MG PO PACK: PACK | ORAL | Status: DC

## 2015-05-14 MED ORDER — ALLOPURINOL 100 MG PO TABS: 100.0000 mg | ORAL_TABLET | Freq: Every day | ORAL | Status: DC

## 2015-05-14 MED ORDER — NAPROXEN 500 MG PO TABS: ORAL_TABLET | ORAL | Status: DC

## 2015-05-14 MED ORDER — ALBUTEROL SULFATE HFA 108 (90 BASE) MCG/ACT IN AERS: INHALATION_SPRAY | RESPIRATORY_TRACT | Status: DC

## 2015-05-14 NOTE — Telephone Encounter (Signed)
Pt is very overdue for f/up, but does have appt scheduled for 11/18 w/Sarah. I will send in 1 mos RFs of all but tramadol, and pend it for review. Sending to PA pool along with Dr L in his absence from office.

## 2015-05-15 ENCOUNTER — Other Ambulatory Visit: Payer: Self-pay | Admitting: Physician Assistant

## 2015-05-16 MED ORDER — TRAMADOL HCL 50 MG PO TABS: ORAL_TABLET | ORAL | Status: DC

## 2015-05-16 NOTE — Telephone Encounter (Signed)
Rx faxed

## 2015-05-26 ENCOUNTER — Other Ambulatory Visit: Payer: Self-pay | Admitting: Physician Assistant

## 2015-06-01 ENCOUNTER — Ambulatory Visit: Payer: Medicare Other | Admitting: Physician Assistant

## 2015-06-10 ENCOUNTER — Other Ambulatory Visit: Payer: Self-pay | Admitting: Family Medicine

## 2015-06-29 ENCOUNTER — Other Ambulatory Visit: Payer: Self-pay | Admitting: Family Medicine

## 2015-07-01 ENCOUNTER — Telehealth: Payer: Self-pay

## 2015-07-01 NOTE — Telephone Encounter (Addendum)
81 years old and can't come in.   Pharmacy told patient to call and she needed an OV for her refill request. She is asking for the provider Amy Cones(Weber) to renew her prescriptions for next year.   [phone]

## 2015-07-01 NOTE — Telephone Encounter (Signed)
Patient needs refill on Nexium. [facility] [address] She has been unable to come in due to change of provider (previous Lauenstein patient) and change of home health agency (she only has help in the evenings at this time). She does plan to return to see Benny LennertSarah Weber in January. She was upset with the previous call a few minutes ago. She said our staff member talked to her like she wasn't a person. I apologized to her and she calmed down.

## 2015-07-02 MED ORDER — PANTOPRAZOLE SODIUM 40 MG PO TBEC: DELAYED_RELEASE_TABLET | ORAL | Status: DC

## 2015-07-02 NOTE — Telephone Encounter (Signed)
Done

## 2015-07-02 NOTE — Telephone Encounter (Signed)
Last OV 03/24/2014

## 2015-07-06 ENCOUNTER — Other Ambulatory Visit: Payer: Self-pay | Admitting: Family Medicine

## 2015-07-09 ENCOUNTER — Other Ambulatory Visit: Payer: Self-pay | Admitting: Family Medicine

## 2015-07-09 ENCOUNTER — Other Ambulatory Visit: Payer: Self-pay | Admitting: Physician Assistant

## 2015-07-09 NOTE — Telephone Encounter (Signed)
Faxed

## 2015-07-24 ENCOUNTER — Other Ambulatory Visit: Payer: Self-pay | Admitting: Family Medicine

## 2015-07-24 ENCOUNTER — Other Ambulatory Visit: Payer: Self-pay | Admitting: Physician Assistant

## 2015-08-06 ENCOUNTER — Other Ambulatory Visit: Payer: Self-pay | Admitting: Physician Assistant

## 2015-09-01 ENCOUNTER — Other Ambulatory Visit: Payer: Self-pay | Admitting: Physician Assistant

## 2015-09-08 ENCOUNTER — Other Ambulatory Visit: Payer: Self-pay | Admitting: Physician Assistant

## 2015-09-19 DIAGNOSIS — M1711 Unilateral primary osteoarthritis, right knee: Secondary | ICD-10-CM | POA: Diagnosis not present

## 2015-09-19 DIAGNOSIS — M1712 Unilateral primary osteoarthritis, left knee: Secondary | ICD-10-CM | POA: Diagnosis not present

## 2015-09-24 ENCOUNTER — Other Ambulatory Visit: Payer: Self-pay | Admitting: Physician Assistant

## 2015-09-26 NOTE — Telephone Encounter (Signed)
Amy Gallagher can we refill previous note states she was going to come in in January.

## 2015-09-27 NOTE — Telephone Encounter (Signed)
Pt has not seen 2015 - if she wants a PPI there are many OTC now

## 2015-10-08 ENCOUNTER — Other Ambulatory Visit: Payer: Self-pay | Admitting: Physician Assistant

## 2015-10-11 NOTE — Telephone Encounter (Signed)
Pt has been told several times that she needs an OV. Last seen 03/2014. Pt has trouble getting in for OV. See last ph mes in Dec. Pt stated she was going to come in to see [name] in Jan. I don't see any appt sched and she has not come in. Dr L, do you want to deny the RF?

## 2015-11-08 ENCOUNTER — Ambulatory Visit (INDEPENDENT_AMBULATORY_CARE_PROVIDER_SITE_OTHER): Payer: Medicare Other | Admitting: Physician Assistant

## 2015-11-08 VITALS — BP 140/80 | HR 66 | Temp 98.3°F | Resp 20 | Ht 63.0 in | Wt 199.4 lb

## 2015-11-08 DIAGNOSIS — Z87891 Personal history of nicotine dependence: Secondary | ICD-10-CM

## 2015-11-08 DIAGNOSIS — G4700 Insomnia, unspecified: Secondary | ICD-10-CM | POA: Diagnosis not present

## 2015-11-08 DIAGNOSIS — J4520 Mild intermittent asthma, uncomplicated: Secondary | ICD-10-CM

## 2015-11-08 DIAGNOSIS — Z780 Asymptomatic menopausal state: Secondary | ICD-10-CM | POA: Diagnosis not present

## 2015-11-08 DIAGNOSIS — M109 Gout, unspecified: Secondary | ICD-10-CM | POA: Diagnosis not present

## 2015-11-08 DIAGNOSIS — Z23 Encounter for immunization: Secondary | ICD-10-CM

## 2015-11-08 DIAGNOSIS — K2100 Gastro-esophageal reflux disease with esophagitis, without bleeding: Secondary | ICD-10-CM

## 2015-11-08 DIAGNOSIS — I10 Essential (primary) hypertension: Secondary | ICD-10-CM

## 2015-11-08 DIAGNOSIS — E2839 Other primary ovarian failure: Secondary | ICD-10-CM | POA: Diagnosis not present

## 2015-11-08 DIAGNOSIS — Z9109 Other allergy status, other than to drugs and biological substances: Secondary | ICD-10-CM

## 2015-11-08 DIAGNOSIS — M1990 Unspecified osteoarthritis, unspecified site: Secondary | ICD-10-CM

## 2015-11-08 DIAGNOSIS — Z91048 Other nonmedicinal substance allergy status: Secondary | ICD-10-CM | POA: Diagnosis not present

## 2015-11-08 DIAGNOSIS — M129 Arthropathy, unspecified: Secondary | ICD-10-CM | POA: Diagnosis not present

## 2015-11-08 MED ORDER — HYDROCHLOROTHIAZIDE 25 MG PO TABS: ORAL_TABLET | ORAL | Status: DC

## 2015-11-08 MED ORDER — FEXOFENADINE HCL 180 MG PO TABS: 180.0000 mg | ORAL_TABLET | Freq: Every day | ORAL | Status: DC

## 2015-11-08 MED ORDER — ALBUTEROL SULFATE HFA 108 (90 BASE) MCG/ACT IN AERS: INHALATION_SPRAY | RESPIRATORY_TRACT | Status: DC

## 2015-11-08 MED ORDER — ZOSTER VACCINE LIVE 19400 UNT/0.65ML ~~LOC~~ SUSR
0.6500 mL | Freq: Once | SUBCUTANEOUS | Status: DC
Start: 2015-11-08 — End: 2016-06-10

## 2015-11-08 MED ORDER — ALPRAZOLAM 0.25 MG PO TABS: 0.2500 mg | ORAL_TABLET | Freq: Every evening | ORAL | Status: DC | PRN

## 2015-11-08 MED ORDER — CELECOXIB 100 MG PO CAPS: 100.0000 mg | ORAL_CAPSULE | Freq: Two times a day (BID) | ORAL | Status: DC | PRN

## 2015-11-08 MED ORDER — FLUTICASONE PROPIONATE 50 MCG/ACT NA SUSP: NASAL | Status: DC

## 2015-11-08 MED ORDER — ALLOPURINOL 100 MG PO TABS: 100.0000 mg | ORAL_TABLET | Freq: Every day | ORAL | Status: DC

## 2015-11-08 MED ORDER — PANTOPRAZOLE SODIUM 40 MG PO TBEC: DELAYED_RELEASE_TABLET | ORAL | Status: DC
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
## 2015-11-08 NOTE — Patient Instructions (Addendum)
Watch your albuterol use and keep track we may need to put you on an additional inhaler to help decrease your need for this type of medication.  I sent the Rx for the shingles vaccine for you to get at the pharmacy.    IF you received an x-ray today, you will receive an invoice from [facility]. Please contact [facility] at [phone] with questions or concerns regarding your invoice.   IF you received labwork today, you will receive an invoice from [facility]. Please contact [facility] at [phone] with questions or concerns regarding your invoice.   Our billing staff will not be able to assist you with questions regarding bills from these companies.  You will be contacted with the lab results as soon as they are available. The fastest way to get your results is to activate your My Chart account. Instructions are located on the last page of this paperwork. If you have not heard from us regarding the results in 2 weeks, please contact this office.

## 2015-11-09 LAB — LIPID PANEL
Cholesterol: 166 mg/dL (ref 125–200)
HDL: 73 mg/dL (ref >=46)
LDL CALC: 80 mg/dL (ref <130)
TRIGLYCERIDES: 63 mg/dL (ref <150)
Total CHOL/HDL Ratio: 2.3 Ratio (ref <=5.0)
VLDL: 13 mg/dL (ref <30)

## 2015-11-09 LAB — CBC WITH DIFFERENTIAL/PLATELET
BASOS PCT: 1 %
Basophils Absolute: 61 cells/uL (ref 0–200)
EOS ABS: 183 {cells}/uL (ref 15–500)
Eosinophils Relative: 3 %
HEMATOCRIT: 39.7 % (ref 35.0–45.0)
HEMOGLOBIN: 13 g/dL (ref 11.7–15.5)
LYMPHS ABS: 2745 {cells}/uL (ref 850–3900)
LYMPHS PCT: 45 %
MCH: 30.7 pg (ref 27.0–33.0)
MCHC: 32.7 g/dL (ref 32.0–36.0)
MCV: 93.6 fL (ref 80.0–100.0)
MONO ABS: 427 {cells}/uL (ref 200–950)
MPV: 11.2 fL (ref 7.5–12.5)
Monocytes Relative: 7 %
NEUTROS PCT: 44 %
Neutro Abs: 2684 cells/uL (ref 1500–7800)
Platelets: 229 10*3/uL (ref 140–400)
RBC: 4.24 MIL/uL (ref 3.80–5.10)
RDW: 14.4 % (ref 11.0–15.0)
WBC: 6.1 10*3/uL (ref 3.8–10.8)

## 2015-11-09 LAB — COMPLETE METABOLIC PANEL WITH GFR
ALT: 20 U/L (ref 6–29)
AST: 20 U/L (ref 10–35)
Albumin: 4.6 g/dL (ref 3.6–5.1)
Alkaline Phosphatase: 55 U/L (ref 33–130)
BUN: 16 mg/dL (ref 7–25)
CALCIUM: 9.7 mg/dL (ref 8.6–10.4)
CHLORIDE: 102 mmol/L (ref 98–110)
CO2: 30 mmol/L (ref 20–31)
CREATININE: 0.83 mg/dL (ref 0.60–0.88)
GFR, Est African American: 75 mL/min (ref >=60)
GFR, Est Non African American: 65 mL/min (ref >=60)
Glucose, Bld: 70 mg/dL (ref 65–99)
POTASSIUM: 5 mmol/L (ref 3.5–5.3)
SODIUM: 139 mmol/L (ref 135–146)
Total Bilirubin: 0.4 mg/dL (ref 0.2–1.2)
Total Protein: 7.5 g/dL (ref 6.1–8.1)

## 2015-11-13 ENCOUNTER — Encounter: Payer: Self-pay | Admitting: Physician Assistant

## 2015-12-19 ENCOUNTER — Other Ambulatory Visit: Payer: Self-pay | Admitting: Physician Assistant

## 2015-12-29 ENCOUNTER — Other Ambulatory Visit: Payer: Self-pay | Admitting: Physician Assistant

## 2016-02-24 ENCOUNTER — Other Ambulatory Visit: Payer: Self-pay | Admitting: Physician Assistant

## 2016-04-03 DIAGNOSIS — M25561 Pain in right knee: Secondary | ICD-10-CM | POA: Diagnosis not present

## 2016-04-03 DIAGNOSIS — M25562 Pain in left knee: Secondary | ICD-10-CM | POA: Diagnosis not present

## 2016-04-03 DIAGNOSIS — M170 Bilateral primary osteoarthritis of knee: Secondary | ICD-10-CM | POA: Diagnosis not present

## 2016-04-10 DIAGNOSIS — M25561 Pain in right knee: Secondary | ICD-10-CM | POA: Diagnosis not present

## 2016-04-10 DIAGNOSIS — M1711 Unilateral primary osteoarthritis, right knee: Secondary | ICD-10-CM | POA: Diagnosis not present

## 2016-05-20 ENCOUNTER — Telehealth: Payer: Self-pay | Admitting: Physician Assistant

## 2016-05-20 NOTE — Telephone Encounter (Signed)
Got paperwork that patient wants incontinence supplies which I have no problem with but there is no documentation in her chart that she has incontinence.  She will need an OV to discuss this.

## 2016-05-21 NOTE — Telephone Encounter (Signed)
Spoke with patient and notified her that she needs to come in for a appointment.

## 2016-05-23 NOTE — Telephone Encounter (Signed)
[facility] also called to check status and I explained pt needs OV for documentation. They advised they will also send pt a message to come in for appt before order can be completed.

## 2016-05-25 ENCOUNTER — Other Ambulatory Visit: Payer: Self-pay | Admitting: Physician Assistant

## 2016-05-25 DIAGNOSIS — M1990 Unspecified osteoarthritis, unspecified site: Secondary | ICD-10-CM

## 2016-06-10 ENCOUNTER — Encounter: Payer: Self-pay | Admitting: Physician Assistant

## 2016-06-10 ENCOUNTER — Ambulatory Visit (INDEPENDENT_AMBULATORY_CARE_PROVIDER_SITE_OTHER): Payer: Medicare Other | Admitting: Physician Assistant

## 2016-06-10 VITALS — BP 162/76 | HR 60 | Temp 98.5°F | Resp 18 | Wt 192.0 lb

## 2016-06-10 DIAGNOSIS — F99 Mental disorder, not otherwise specified: Secondary | ICD-10-CM | POA: Diagnosis not present

## 2016-06-10 DIAGNOSIS — F5105 Insomnia due to other mental disorder: Secondary | ICD-10-CM | POA: Diagnosis not present

## 2016-06-10 DIAGNOSIS — Z23 Encounter for immunization: Secondary | ICD-10-CM

## 2016-06-10 DIAGNOSIS — M1990 Unspecified osteoarthritis, unspecified site: Secondary | ICD-10-CM | POA: Diagnosis not present

## 2016-06-10 DIAGNOSIS — M25532 Pain in left wrist: Secondary | ICD-10-CM | POA: Diagnosis not present

## 2016-06-10 DIAGNOSIS — M1711 Unilateral primary osteoarthritis, right knee: Secondary | ICD-10-CM

## 2016-06-10 DIAGNOSIS — R3915 Urgency of urination: Secondary | ICD-10-CM | POA: Diagnosis not present

## 2016-06-10 NOTE — Progress Notes (Signed)
Amy HeightLouise D Gallagher  MRN: [id number] DOB: 08-Oct-1929  Subjective:  Pt presents to clinic for multiple problems  Pt has urinary urgency - she needs a form to fill out  she has not seen a urologist - she wears pads that help for absorption when she leaks - she is able to do what she wants to with the protective undergarments and for now is happy with the results she gets from that.  Her right knee if causing her a lot of pain and she feels like it is going to give away.  She has been trying to use an ACE wrap but it keeps slipping.  She was wondering if her we had something that might help - she goes to [facility] for injections but they have not given her a knee brace and they do not do medications.  She has been using celebrex which does not hep her pain and Ultram prn but that also does not help her pain - she feels like tylenol helps her pain the most.  She also has pain in her left wrist but only when she is sleeping.  She is wondering if a brace would help her with that - she rests it on a pillow but it still hurts and disrupts her sleep - she does not want to take more medications and she thinks that a brace would help with the pain.  She is right handed.  She has no injury history to that hand - she was a Engineer, drillingprivate duty nurse and she has been told she might have carpal tunnel syndrome.  Insomnia - 2-3 times a week - she takes Xanax  0.125mg  - she has trouble getting to sleep with her anxiety - she has never tried anything else  Pain in her back and her knee - she is getting no relief with her celebrex and the ultram - the tylenol helps the pain the most  Review of Systems  Musculoskeletal: Positive for gait problem (2nd to knee pain - feeling like knee if going to give out).  Psychiatric/Behavioral: Positive for sleep disturbance.    Patient Active Problem List   Diagnosis Date Noted  . Urinary urgency 06/10/2016  . Environmental allergies 08/14/2011  . Gout 09/10/2006  . DEPRESSION,  MAJOR, RECURRENT 09/10/2006  . Former smoker 09/10/2006  . HYPERTENSION, BENIGN SYSTEMIC 09/10/2006  . Mild intermittent asthma 09/10/2006  . REFLUX ESOPHAGITIS 09/10/2006  . Arthritis 09/10/2006    Current Outpatient Prescriptions on File Prior to Visit  Medication Sig Dispense Refill  . allopurinol (ZYLOPRIM) 100 MG tablet TAKE 1 TABLET (100 MG TOTAL) BY MOUTH DAILY. 90 tablet 0  . ALPRAZolam (XANAX) 0.25 MG tablet Take 1 tablet (0.25 mg total) by mouth at bedtime as needed. 30 tablet 0  . celecoxib (CELEBREX) 100 MG capsule Take 1 capsule (100 mg total) by mouth 2 (two) times daily as needed. 60 capsule 2  . fluticasone (FLONASE) 50 MCG/ACT nasal spray PLACE 2 SPRAYS INTO BOTH NOSTRILS DAILY. 48 g 4  . hydrochlorothiazide (HYDRODIURIL) 25 MG tablet TAKE 1 TABLET (25 MG TOTAL) BY MOUTH DAILY 90 tablet 0  . Multiple Vitamin (MULTIVITAMIN WITH MINERALS) TABS tablet Take 1 tablet by mouth daily.    . pantoprazole (PROTONIX) 40 MG tablet TAKE 1 TABLET BY MOUTH DAILY 90 tablet 0  . PROAIR HFA 108 (90 Base) MCG/ACT inhaler INHALE 2 PUFFS BY MOUTH EVERY 4-6 HOURS AS NEEDED. 8.5 Inhaler 3  . traMADol (ULTRAM) 50 MG tablet TAKE 1  TABLET BY MOUTH EVERY 12 HOURS AS NEEDED 60 tablet 0  . vitamin C (ASCORBIC ACID) 500 MG tablet Take 500 mg by mouth daily.     No current facility-administered medications on file prior to visit.     Allergies  Allergen Reactions  . Aspirin Nausea And Vomiting  . Penicillins Swelling    Pt patients past, family and social history were reviewed and updated.   Objective:  BP (!) 162/76 (BP Location: Right Arm, Cuff Size: Large)   Pulse 60   Temp 98.5 F (36.9 C) (Oral)   Resp 18   Wt 192 lb (87.1 kg)   BMI 34.01 kg/m   Physical Exam  Constitutional: She is oriented to person, place, and time and well-developed, well-nourished, and in no distress.  HENT:  Head: Normocephalic and atraumatic.  Right Ear: Hearing and external ear normal.  Left Ear: Hearing  and external ear normal.  Eyes: Conjunctivae are normal.  Neck: Normal range of motion.  Cardiovascular: Normal rate, regular rhythm and normal heart sounds.   No murmur heard. Pulmonary/Chest: Effort normal and breath sounds normal. She has no wheezes.  Musculoskeletal:       Left wrist: She exhibits tenderness (general) and swelling.       Right knee: Tenderness found.  Reaction OA brace placed and the patient felt much more stable in the knee and her pain was better.  Left wrist was placed in a brace to her to sleep in at night and it was comfortable for her.  Neurological: She is alert and oriented to person, place, and time. Gait normal.  Skin: Skin is warm and dry.  Mild TTP over her middle forehead with a palpable area - no defect in skull palpable  Psychiatric: Mood, memory, affect and judgment normal.  Vitals reviewed.   Assessment and Plan :  Urinary urgency - continue with medical supplies for incontinence - pt is happy with the results - we talked about a referral to urologist and medication but with age and her happiness with her current situation - she will get forms sent to me about which incontinence supplies that she wants  Flu vaccine need - Plan: Flu Vaccine QUAD 36+ mos IM  Arthritis of right knee - reaction OA placed on patient - she is happy with the fit and the decrease in pain and decrease sensation of giving away - she will continue to get her injections - she will continue dosed tylenol for her pain  Left wrist pain - wear wrist splint at night.  Insomnia due to other mental disorder - we discussed that Xanax is not a great sleep medication - she takes a small dose and not every night - we discussed elavil in the future at a low dose to help with sleep - she would like to wait for now - another possibility is Celexa as she has anxiety and is not an on SSRI  Arthritis  Benny LennertSarah Weber PA-C  [facility] 06/10/2016  7:45 PM

## 2016-06-10 NOTE — Patient Instructions (Addendum)
claritin - for your allergies - please get the generic - loratadine  Use tyelnol arthritis at 7am, 1-2 pm and right before bed since this helps your pain   IF you received an x-ray today, you will receive an invoice from [facility]. Please contact [facility] at [phone] with questions or concerns regarding your invoice.   IF you received labwork today, you will receive an invoice from [facility]. Please contact [facility] at [phone] with questions or concerns regarding your invoice.   Our billing staff will not be able to assist you with questions regarding bills from these companies.  You will be contacted with the lab results as soon as they are available. The fastest way to get your results is to activate your My Chart account. Instructions are located on the last page of this paperwork. If you have not heard from us regarding the results in 2 weeks, please contact this office.

## 2016-06-11 ENCOUNTER — Other Ambulatory Visit: Payer: Self-pay | Admitting: Physician Assistant

## 2016-06-11 DIAGNOSIS — M1990 Unspecified osteoarthritis, unspecified site: Secondary | ICD-10-CM

## 2016-06-11 NOTE — Telephone Encounter (Signed)
Patient is calling to request a refill for Celebrex.  She was seen yesterday by Valarie ConesWeber. Please advise   [phone]

## 2016-06-12 NOTE — Telephone Encounter (Signed)
Is she continuing the Celebrex?  Note said it wasn't helping?

## 2016-06-14 ENCOUNTER — Telehealth: Payer: Self-pay

## 2016-06-14 NOTE — Telephone Encounter (Signed)
Do not refill - pt states it is not working and she is going to use tylenol

## 2016-06-14 NOTE — Telephone Encounter (Signed)
Routed to weber  

## 2016-06-14 NOTE — Telephone Encounter (Signed)
WEBER - Pt wants to know if there is anything else at all you can call in for her arthritis.  She says the celebrex + tylenol was not working and that you said there was something else she could take at night time.  She wants to take something during the day to help her.  Says the celebrex made her nauseated.  Says she is hurting so bad and just doesn't know what to do.  Please call [phone]

## 2016-06-17 NOTE — Telephone Encounter (Signed)
Attempted to call pt, left VM for pt to call back  

## 2016-06-17 NOTE — Telephone Encounter (Signed)
Is this for hand or knee pain - I think that a topical might be a good option for her if she thinks that she can rub it on that way there is no medication reaction potential.  Please let me know if this is something that the patient is interested in - please also see if the braces are helping with her pain.

## 2016-06-17 NOTE — Telephone Encounter (Signed)
Pt. States she has pain all over in her joints, her back. Hard to sleep and hard to get up. Using brace but it hurts because there is not a lot of padding in brace. She uses tylenol and celebrex and tramadol and has joint cream at home but does not help much. I offered ov but she said its hard to get here. Wants advice or other rx to try.

## 2016-06-18 NOTE — Telephone Encounter (Signed)
Pt reported that she is using the diclofenac gel, which isn't helping that much. She has been to ortho and he gave her the tramadol and tylenol 8, but she still hurts so bad she can't hardly get out of chair. Asked if ortho tried any injections in knees and she stated that she went to someone who did the [facility] (sp?) injs and it did help, but he doesn't want to do inj with the medications she is on. Asked if she tried lidocaine patches and she stated that ortho tried that and she thinks it helped but patch broke out her skin. [name], is there any lidocaine cream /gel that might help? Or any other suggestions?

## 2016-06-18 NOTE — Telephone Encounter (Signed)
What joint cream is she using?  There is not one on her medication list.  I would suggest voltarn topical gel to help with her wrist or knee pain - if the brace itself is hurting her skin please have to check to make sure it is on correctly and not going to causing sin breakdown and see if she will wear it over her clothes which may help with the pressure.  How often is she using the ultram?

## 2016-06-24 ENCOUNTER — Telehealth: Payer: Self-pay | Admitting: Emergency Medicine

## 2016-06-24 ENCOUNTER — Other Ambulatory Visit: Payer: Self-pay | Admitting: Physician Assistant

## 2016-06-24 DIAGNOSIS — G4700 Insomnia, unspecified: Secondary | ICD-10-CM

## 2016-06-24 NOTE — Telephone Encounter (Signed)
Left message to return call 

## 2016-06-24 NOTE — Telephone Encounter (Signed)
Done

## 2016-06-24 NOTE — Telephone Encounter (Signed)
There is no other cream that I know of that can just be rubbed on her aching joints - it might be best if she saw a sports medicine provider - is that something that interests her - they will have great ideas for her

## 2016-06-25 NOTE — Telephone Encounter (Signed)
Faxed to pharmacy

## 2016-06-26 NOTE — Telephone Encounter (Signed)
Spoke with pt.  Gave message about Voltaren gel to pt per [name]. She states she is waiting for the injection from [facility] - she is getting dental work done now and when that is over - hopefully today, she will go for the injections.  I encouraged her to continue with the gel as it does help some. She agreed and will call if she needs us.

## 2016-06-27 ENCOUNTER — Other Ambulatory Visit: Payer: Self-pay

## 2016-06-27 NOTE — Telephone Encounter (Signed)
Fax received from [employer] [place] Alprazolam 0.25mg  in process

## 2016-06-30 ENCOUNTER — Telehealth: Payer: Self-pay

## 2016-06-30 NOTE — Telephone Encounter (Signed)
Medicare rep called and LM asking for me to CB to ans ?s about alprazolam Rx. Called and advised Dx of anxiety state, unspecified 300.00, and verified that she is NOT taking two other medications in conjunction with this med.

## 2016-07-11 NOTE — Telephone Encounter (Signed)
[employer] is faxing CMN for Benny LennertSarah Weber to complete [phone]

## 2016-07-19 NOTE — Telephone Encounter (Signed)
COMPLETED AND FAXED, sent to scanning

## 2016-08-14 DIAGNOSIS — M25462 Effusion, left knee: Secondary | ICD-10-CM | POA: Diagnosis not present

## 2016-08-14 DIAGNOSIS — M25561 Pain in right knee: Secondary | ICD-10-CM | POA: Diagnosis not present

## 2016-08-14 DIAGNOSIS — M25562 Pain in left knee: Secondary | ICD-10-CM | POA: Diagnosis not present

## 2016-08-14 DIAGNOSIS — M170 Bilateral primary osteoarthritis of knee: Secondary | ICD-10-CM | POA: Diagnosis not present

## 2016-08-21 DIAGNOSIS — M25561 Pain in right knee: Secondary | ICD-10-CM | POA: Diagnosis not present

## 2016-08-21 DIAGNOSIS — M170 Bilateral primary osteoarthritis of knee: Secondary | ICD-10-CM | POA: Diagnosis not present

## 2016-08-21 DIAGNOSIS — M25562 Pain in left knee: Secondary | ICD-10-CM | POA: Diagnosis not present

## 2016-09-23 ENCOUNTER — Telehealth: Payer: Self-pay | Admitting: Physician Assistant

## 2016-09-23 NOTE — Telephone Encounter (Signed)
Please review.  I agree that she would need to come in for a new brace. Deliah BostonMichael Clark, MS, PA-C 9:55 PM, 09/23/2016

## 2016-09-23 NOTE — Telephone Encounter (Signed)
Pt was seen on 06/10/16 by Benny LennertSarah Gallagher. Pt is stating she  never received a part for her right  knee brace and she would like it now. She would also like another knee  brace for her left knee. I advised her to come in for an OV, she says this is very difficult for her. Please advise [phone]

## 2016-09-24 ENCOUNTER — Ambulatory Visit (INDEPENDENT_AMBULATORY_CARE_PROVIDER_SITE_OTHER): Payer: Medicare Other | Admitting: Physician Assistant

## 2016-09-24 VITALS — BP 130/80 | HR 58 | Temp 98.3°F | Resp 18 | Ht 63.0 in | Wt 183.0 lb

## 2016-09-24 DIAGNOSIS — R350 Frequency of micturition: Secondary | ICD-10-CM | POA: Diagnosis not present

## 2016-09-24 DIAGNOSIS — M25562 Pain in left knee: Secondary | ICD-10-CM | POA: Diagnosis not present

## 2016-09-24 DIAGNOSIS — G8929 Other chronic pain: Secondary | ICD-10-CM

## 2016-09-24 DIAGNOSIS — R059 Cough, unspecified: Secondary | ICD-10-CM

## 2016-09-24 DIAGNOSIS — R05 Cough: Secondary | ICD-10-CM

## 2016-09-24 DIAGNOSIS — M1990 Unspecified osteoarthritis, unspecified site: Secondary | ICD-10-CM

## 2016-09-24 DIAGNOSIS — Z87898 Personal history of other specified conditions: Secondary | ICD-10-CM

## 2016-09-24 DIAGNOSIS — R058 Other specified cough: Secondary | ICD-10-CM

## 2016-09-24 DIAGNOSIS — M25561 Pain in right knee: Secondary | ICD-10-CM | POA: Diagnosis not present

## 2016-09-24 LAB — POCT URINALYSIS DIP (MANUAL ENTRY)
Bilirubin, UA: NEGATIVE
Blood, UA: NEGATIVE
Glucose, UA: NEGATIVE
Ketones, POC UA: NEGATIVE
Leukocytes, UA: NEGATIVE
Nitrite, UA: NEGATIVE
Protein Ur, POC: NEGATIVE
Spec Grav, UA: 1.02
Urobilinogen, UA: 0.2
pH, UA: 7.5

## 2016-09-24 LAB — POC MICROSCOPIC URINALYSIS (UMFC): Mucus: ABSENT

## 2016-09-24 MED ORDER — HYDROCODONE-HOMATROPINE 5-1.5 MG/5ML PO SYRP: 5.0000 mL | ORAL_SOLUTION | Freq: Three times a day (TID) | ORAL | 0 refills | Status: DC | PRN

## 2016-09-24 MED ORDER — PROMETHAZINE-DM 6.25-15 MG/5ML PO SYRP: 5.0000 mL | ORAL_SOLUTION | Freq: Four times a day (QID) | ORAL | 0 refills | Status: DC | PRN

## 2016-09-24 MED ORDER — CETIRIZINE HCL 10 MG PO TABS: 10.0000 mg | ORAL_TABLET | Freq: Every day | ORAL | 11 refills | Status: DC

## 2016-09-24 MED ORDER — NAPROXEN 500 MG PO TABS: 500.0000 mg | ORAL_TABLET | Freq: Two times a day (BID) | ORAL | 0 refills | Status: DC

## 2016-09-24 MED ORDER — TROLAMINE SALICYLATE 10 % EX CREA
1.0000 | TOPICAL_CREAM | CUTANEOUS | 0 refills | Status: DC | PRN
Start: 2016-09-24 — End: 2022-02-12

## 2016-09-24 NOTE — Telephone Encounter (Signed)
L/m with ortho company help # [phone] And advice to come in if brace is needed

## 2016-09-24 NOTE — Progress Notes (Signed)
Amy Gallagher  MRN: [id number] DOB: [birth year]  PCP: Virgilio Belling  Subjective:  Pt is an 83 year old female PMH HTN, asthma, arthritis, gout, depression, who presents to clinic for knee pain.  H/o arthritis b/l knees. She was here a few months ago and received a Don Joy knee brace for her right knee. This made her knee feel better, however she never received the sleeve to go under the brace. It hurt her to wear. She called Irena Cords and was told she should come get a replacement.  She would also like a brace for her left knee.  She takes Naproxen and uses gel.   C/o Cough - she was sick last week. Feels better now, however c/o lingering cough. Wakes her up at night. She is using flonase - helps some. Denies SOB, wheezing, chest pain,   H/o urinary frequency - she was here a few months ago for urinary frequency. Urine at that time did not show UTI. She has a history of incontinence - f/u with Benny Lennert for incontinence supplies and possible referral to urology. She would like urine tested today for infection.   Review of Systems  Constitutional: Negative for chills, diaphoresis, fatigue and fever.  Respiratory: Positive for cough. Negative for chest tightness, shortness of breath and wheezing.   Cardiovascular: Negative for chest pain and palpitations.  Gastrointestinal: Negative for abdominal pain, diarrhea, nausea and vomiting.  Genitourinary: Positive for frequency. Negative for difficulty urinating, dysuria, enuresis and urgency.  Musculoskeletal: Positive for arthralgias (b/l knee pain), gait problem and joint swelling.  Neurological: Negative for weakness, light-headedness and headaches.    Patient Active Problem List   Diagnosis Date Noted  . Urinary urgency 06/10/2016  . Environmental allergies 08/14/2011  . Gout 09/10/2006  . DEPRESSION, MAJOR, RECURRENT 09/10/2006  . Former smoker 09/10/2006  . HYPERTENSION, BENIGN SYSTEMIC 09/10/2006  . Mild intermittent asthma  09/10/2006  . REFLUX ESOPHAGITIS 09/10/2006  . Arthritis 09/10/2006    Current Outpatient Prescriptions on File Prior to Visit  Medication Sig Dispense Refill  . allopurinol (ZYLOPRIM) 100 MG tablet TAKE 1 TABLET (100 MG TOTAL) BY MOUTH DAILY. 90 tablet 0  . ALPRAZolam (XANAX) 0.25 MG tablet Take 1 tablet (0.25 mg total) by mouth at bedtime as needed. 30 tablet 0  . fluticasone (FLONASE) 50 MCG/ACT nasal spray PLACE 2 SPRAYS INTO BOTH NOSTRILS DAILY. 48 g 4  . hydrochlorothiazide (HYDRODIURIL) 25 MG tablet TAKE 1 TABLET (25 MG TOTAL) BY MOUTH DAILY 90 tablet 0  . Multiple Vitamin (MULTIVITAMIN WITH MINERALS) TABS tablet Take 1 tablet by mouth daily.    . pantoprazole (PROTONIX) 40 MG tablet TAKE 1 TABLET BY MOUTH DAILY 90 tablet 0  . PROAIR HFA 108 (90 Base) MCG/ACT inhaler INHALE 2 PUFFS BY MOUTH EVERY 4-6 HOURS AS NEEDED. 8.5 Inhaler 3  . traMADol (ULTRAM) 50 MG tablet TAKE 1 TABLET BY MOUTH EVERY 12 HOURS AS NEEDED 60 tablet 0  . vitamin C (ASCORBIC ACID) 500 MG tablet Take 500 mg by mouth daily.    . celecoxib (CELEBREX) 100 MG capsule Take 1 capsule (100 mg total) by mouth 2 (two) times daily as needed. (Patient not taking: Reported on 09/24/2016) 60 capsule 2   No current facility-administered medications on file prior to visit.     Allergies  Allergen Reactions  . Aspirin Nausea And Vomiting  . Penicillins Swelling     Objective:  BP 130/80 (BP Location: Right Arm, Patient Position: Sitting, Cuff Size: Large)  Pulse (!) 58   Temp 98.3 F (36.8 C) (Oral)   Resp 18   Ht 5\' 3"  (1.6 m)   Wt 183 lb (83 kg)   SpO2 98%   BMI 32.42 kg/m   Physical Exam  Constitutional: She is oriented to person, place, and time and well-developed, well-nourished, and in no distress. No distress.  Cardiovascular: Normal rate, regular rhythm and normal heart sounds.   Pulmonary/Chest: Effort normal and breath sounds normal.  Musculoskeletal:       Right knee: She exhibits swelling. She  exhibits normal range of motion, no effusion and no bony tenderness. Tenderness found. Medial joint line and lateral joint line tenderness noted.       Left knee: She exhibits normal range of motion and no swelling. Tenderness found. Medial joint line and lateral joint line tenderness noted.  Neurological: She is alert and oriented to person, place, and time. GCS score is 15.  Skin: Skin is warm and dry.  Psychiatric: Mood, memory, affect and judgment normal.  Vitals reviewed.  Results for orders placed or performed in visit on 09/24/16  POCT Microscopic Urinalysis ([facility])  Result Value Ref Range   WBC,UR,HPF,POC None None WBC/hpf   RBC,UR,HPF,POC None None RBC/hpf   Bacteria None None, Too numerous to count   Mucus Absent Absent   Epithelial Cells, UR Per Microscopy Few (A) None, Too numerous to count cells/hpf  POCT urinalysis dipstick  Result Value Ref Range   Color, UA yellow yellow   Clarity, UA clear clear   Glucose, UA negative negative   Bilirubin, UA negative negative   Ketones, POC UA negative negative   Spec Grav, UA 1.020 1.003, 1.005, 1.010, 1.015, 1.020, 1.025, 1.030, 1.035   Blood, UA negative negative   pH, UA 7.5 5.0, 5.5, 6.0, 6.5, 7.0, 7.5, 8.0   Protein Ur, POC negative negative   Urobilinogen, UA 0.2 0.2, 1.0, negative   Nitrite, UA Negative Negative   Leukocytes, UA Negative Negative    Assessment and Plan :  1. Arthritis 2. Chronic pain of both knees - naproxen (NAPROSYN) 500 MG tablet; Take 1 tablet (500 mg total) by mouth 2 (two) times daily with a meal.  Dispense: 30 tablet; Refill: 0 - trolamine salicylate (ASPERCREME/ALOE) 10 % cream; Apply 1 application topically as needed for muscle pain.  Dispense: 85 g; Refill: 0 - DonJoy spider braces applied to both knees. Pt is praising the Lord for how good they make her knees feel.   3. Upper airway cough syndrome 4. Cough - promethazine-dextromethorphan (PROMETHAZINE-DM) 6.25-15 MG/5ML syrup; Take 5 mLs by  mouth 4 (four) times daily as needed for cough.  Dispense: 118 mL; Refill: 0 - HYDROcodone-homatropine (HYCODAN) 5-1.5 MG/5ML syrup; Take 5 mLs by mouth every 8 (eight) hours as needed for cough.  Dispense: 120 mL; Refill: 0 - cetirizine (ZYRTEC) 10 MG tablet; Take 1 tablet (10 mg total) by mouth daily.  Dispense: 30 tablet; Refill: 11 - Discussed with pt supportive care. RTC in one week if no improvement in symptoms. Consider imaging at that time.  5. History of urinary incontinence 6. Urinary frequency - Urine culture - POCT Microscopic Urinalysis ([facility]) - POCT urinalysis dipstick - Low suspicion for UTI. Suspect incontinence. She has plans with Benny LennertSarah Weber for possible referral to urology when she wants to and for incontinence supplies. RTC if symptoms persist.   Marco CollieWhitney Broden Holt, PA-C  Primary Care at [facility] 09/24/2016 3:47 PM

## 2016-09-24 NOTE — Patient Instructions (Addendum)
Stay well hydrated.  Come back in 5-7 days if your cough continues/does not get better.   Thank you for coming in today. I hope you feel we met your needs.  Feel free to call [facility] if you have any questions or further requests.  Please consider signing up for MyChart if you do not already have it, as this is a great way to communicate with me.  Best,  Whitney McVey, PA-C   IF you received an x-ray today, you will receive an invoice from [facility]. Please contact [facility] at [phone] with questions or concerns regarding your invoice.   IF you received labwork today, you will receive an invoice from [facility]. Please contact [facility] at [phone] with questions or concerns regarding your invoice.   Our billing staff will not be able to assist you with questions regarding bills from these companies.  You will be contacted with the lab results as soon as they are available. The fastest way to get your results is to activate your My Chart account. Instructions are located on the last page of this paperwork. If you have not heard from [place] regarding the results in 2 weeks, please contact this office.

## 2016-09-24 NOTE — Telephone Encounter (Signed)
She needs to call the company for the missing part and at that time if she wants to ask them if they will send her one by mail if they have an order I am happy to send them an order.  We must have her come to the office to get the brace so she can be properly fitted.  I am glad she is interested in another one as I hope I can assume the one for the other leg is helping her.

## 2016-09-26 LAB — URINE CULTURE

## 2016-10-01 ENCOUNTER — Encounter: Payer: Self-pay | Admitting: Physician Assistant

## 2016-10-02 ENCOUNTER — Telehealth: Payer: Self-pay | Admitting: Physician Assistant

## 2016-10-02 NOTE — Telephone Encounter (Signed)
PATIENT SAW [facility] MARCH 14th FOR URINE PROBLEMS AND A COUGH. SHE WOULD LIKE TO GET THE RESULTS OF HER URINE CULTURE. SHE SAID [facility] GAVE HER 2 MEDICINES FOR HER COUGH. ONE TO TAKE DURING THE DAY AND ONE TO TAKE AT NIGHT. SHE WAS ONLY ABLE TO GET THE ONE SHE TAKES DURING THE DAY. THE OTHER ONE WAS $37.00 AND SHE COULD NOT AFFORD IT. SHE WOULD LIKE TO SEE IF SHE CAN GET SOMETHING CHEAPER? BEST PHONE [phone] (HOME) PHARMACY CHOICE IS [facility] ON [address]. MBC

## 2016-10-03 NOTE — Telephone Encounter (Signed)
l/m with providers note

## 2016-10-03 NOTE — Telephone Encounter (Signed)
Urine looks negative? New cough med? Or ov? Seen 09/24/16

## 2016-10-03 NOTE — Telephone Encounter (Signed)
Lab will call about results. She should come in and be seen since she's been coughing >10 days. Thank you!

## 2016-10-05 ENCOUNTER — Other Ambulatory Visit: Payer: Self-pay | Admitting: Physician Assistant

## 2016-10-09 DIAGNOSIS — M25562 Pain in left knee: Secondary | ICD-10-CM | POA: Diagnosis not present

## 2016-10-09 DIAGNOSIS — M25561 Pain in right knee: Secondary | ICD-10-CM | POA: Diagnosis not present

## 2016-10-09 DIAGNOSIS — M170 Bilateral primary osteoarthritis of knee: Secondary | ICD-10-CM | POA: Diagnosis not present

## 2016-10-20 ENCOUNTER — Other Ambulatory Visit: Payer: Self-pay | Admitting: Physician Assistant

## 2016-10-20 DIAGNOSIS — M25561 Pain in right knee: Principal | ICD-10-CM

## 2016-10-20 DIAGNOSIS — G8929 Other chronic pain: Secondary | ICD-10-CM

## 2016-10-20 DIAGNOSIS — M25562 Pain in left knee: Principal | ICD-10-CM

## 2016-10-21 NOTE — Telephone Encounter (Signed)
10/2015 last refll

## 2016-10-31 ENCOUNTER — Other Ambulatory Visit: Payer: Self-pay | Admitting: Physician Assistant

## 2016-11-01 ENCOUNTER — Other Ambulatory Visit: Payer: Self-pay | Admitting: Physician Assistant

## 2016-11-04 ENCOUNTER — Other Ambulatory Visit: Payer: Self-pay | Admitting: Physician Assistant

## 2016-11-17 ENCOUNTER — Telehealth: Payer: Self-pay | Admitting: Physician Assistant

## 2016-11-17 ENCOUNTER — Other Ambulatory Visit: Payer: Self-pay | Admitting: Physician Assistant

## 2016-11-17 DIAGNOSIS — M109 Gout, unspecified: Secondary | ICD-10-CM

## 2016-11-17 MED ORDER — FLUTICASONE PROPIONATE 50 MCG/ACT NA SUSP: NASAL | 1 refills | Status: DC

## 2016-11-17 NOTE — Telephone Encounter (Signed)
PATIENT STATES HER PHARMACY SHOULD HAVE SENT OVER A REFILL REQUEST FOR HER FLUTICASONE (FLONASE) 50 MCG LAST WEEK. I TOLD HER I SEE HER GOUT REQUEST THAT WAS SENT TODAY (11/17/16), BUT I DIDN'T SEE A REQUEST FOR HER FLONASE. SHE SAID SHE GOT WET AND HER BRONCHITIS IS ACTING UP. BEST PHONE [phone] (HOME) PHARMACY CHOICE IS [facility] ON [address]. MBC

## 2016-11-20 DIAGNOSIS — M170 Bilateral primary osteoarthritis of knee: Secondary | ICD-10-CM | POA: Diagnosis not present

## 2016-11-20 DIAGNOSIS — Z789 Other specified health status: Secondary | ICD-10-CM | POA: Diagnosis not present

## 2016-11-20 DIAGNOSIS — M25561 Pain in right knee: Secondary | ICD-10-CM | POA: Diagnosis not present

## 2016-11-20 DIAGNOSIS — M25562 Pain in left knee: Secondary | ICD-10-CM | POA: Diagnosis not present

## 2016-11-27 DIAGNOSIS — M25562 Pain in left knee: Secondary | ICD-10-CM | POA: Diagnosis not present

## 2016-11-27 DIAGNOSIS — M170 Bilateral primary osteoarthritis of knee: Secondary | ICD-10-CM | POA: Diagnosis not present

## 2016-11-27 DIAGNOSIS — M25561 Pain in right knee: Secondary | ICD-10-CM | POA: Diagnosis not present

## 2016-12-03 DIAGNOSIS — M25561 Pain in right knee: Secondary | ICD-10-CM | POA: Diagnosis not present

## 2016-12-03 DIAGNOSIS — M170 Bilateral primary osteoarthritis of knee: Secondary | ICD-10-CM | POA: Diagnosis not present

## 2016-12-03 DIAGNOSIS — M25562 Pain in left knee: Secondary | ICD-10-CM | POA: Diagnosis not present

## 2016-12-06 ENCOUNTER — Other Ambulatory Visit: Payer: Self-pay | Admitting: Physician Assistant

## 2016-12-06 DIAGNOSIS — M25561 Pain in right knee: Principal | ICD-10-CM

## 2016-12-06 DIAGNOSIS — M25562 Pain in left knee: Principal | ICD-10-CM

## 2016-12-06 DIAGNOSIS — G8929 Other chronic pain: Secondary | ICD-10-CM

## 2016-12-08 NOTE — Telephone Encounter (Signed)
10/31/16 last refill 

## 2016-12-20 ENCOUNTER — Other Ambulatory Visit: Payer: Self-pay | Admitting: Physician Assistant

## 2016-12-20 DIAGNOSIS — M25561 Pain in right knee: Principal | ICD-10-CM

## 2016-12-20 DIAGNOSIS — G8929 Other chronic pain: Secondary | ICD-10-CM

## 2016-12-20 DIAGNOSIS — M25562 Pain in left knee: Principal | ICD-10-CM

## 2016-12-25 ENCOUNTER — Telehealth: Payer: Self-pay | Admitting: Physician Assistant

## 2016-12-25 NOTE — Telephone Encounter (Signed)
Pt is requesting that  Weber fax over to [facility] an order for a transportation wheel chair  Best number to fax is [phone]  Best number for pt [phone]

## 2016-12-26 MED ORDER — WHEEL CHAIR K1 BASIC DESK ARM MISC: 1.0000 | Freq: Once | 1 refills | Status: AC

## 2016-12-26 NOTE — Telephone Encounter (Signed)
Printed - ready to fax - placed in nurse box.

## 2016-12-26 NOTE — Telephone Encounter (Signed)
Please advise 

## 2017-01-08 ENCOUNTER — Telehealth: Payer: Self-pay | Admitting: Physician Assistant

## 2017-01-08 DIAGNOSIS — M1990 Unspecified osteoarthritis, unspecified site: Secondary | ICD-10-CM

## 2017-01-08 NOTE — Telephone Encounter (Signed)
Pt is needing to have the form for the wheel chair the notes states it was done on 12/26/16 but the facility can not locate one  ((check Previous note on 12/26/16)   Best number [phone]

## 2017-01-10 ENCOUNTER — Other Ambulatory Visit: Payer: Self-pay | Admitting: Physician Assistant

## 2017-01-10 DIAGNOSIS — M25561 Pain in right knee: Principal | ICD-10-CM

## 2017-01-10 DIAGNOSIS — G8929 Other chronic pain: Secondary | ICD-10-CM

## 2017-01-10 DIAGNOSIS — M25562 Pain in left knee: Principal | ICD-10-CM

## 2017-01-13 MED ORDER — NAPROXEN 500 MG PO TABS: 500.0000 mg | ORAL_TABLET | Freq: Two times a day (BID) | ORAL | 4 refills | Status: DC

## 2017-01-13 NOTE — Telephone Encounter (Signed)
Saw that you had made the letter and put it in nurses box to be faxed however I did not see it in nurses box and no one documented if they had faxed it or not, I also could not find the letter any where in the chart so if you could rewrite it and hand it to me I can fax it. Thanks

## 2017-01-15 ENCOUNTER — Telehealth: Payer: Self-pay | Admitting: Family Medicine

## 2017-01-15 NOTE — Telephone Encounter (Signed)
CALLED PT TO SCHEDULE AN OV FOR MED REFILL ON NAPROXEN PT STATES THAT SHE WILL CALL AND MAKE AN APPOINTMENT WHEN SHE GET HER WHEELCHAIR SHOWER CHAIR FROM [facility] SHE NEEDS THE WHEEL CHAIR TO GET AROUND

## 2017-01-15 NOTE — Telephone Encounter (Signed)
CALLED PT TO SCHEDULE AN OV FOR MED REFILL ON NAPROXEN PT STATES THAT SHE WILL CALL AND MAKE AN APPOINTMENT WHEN SHE GET HER WHEELCHAIR SHOWER CHAIR FROM [facility] SHE NEEDS THE WHEEL CHAIR TO GET AROUND 

## 2017-01-15 NOTE — Telephone Encounter (Signed)
ERROR

## 2017-01-16 NOTE — Telephone Encounter (Signed)
Rx written and should be faxed.

## 2017-01-19 NOTE — Telephone Encounter (Signed)
Pt calling about order for wheelchair. She said some paperwork had to be redone and was faxed over and she was following up to see if that was ready so she can get her wheelchair. Pt phone number is [phone]. Please advise.

## 2017-01-20 ENCOUNTER — Telehealth: Payer: Self-pay | Admitting: Physician Assistant

## 2017-01-20 DIAGNOSIS — M1990 Unspecified osteoarthritis, unspecified site: Secondary | ICD-10-CM

## 2017-01-20 NOTE — Telephone Encounter (Signed)
Please advise 

## 2017-01-20 NOTE — Telephone Encounter (Signed)
Wheelchair order form from [facility] has been put in [employer]'s box.  Please complete when possible so pt can be mobile for her appointment.  Thank you!

## 2017-01-22 NOTE — Telephone Encounter (Signed)
Absolutely signing and placing to be faxed.

## 2017-01-22 NOTE — Telephone Encounter (Signed)
Will you please sign this order.

## 2017-01-27 ENCOUNTER — Telehealth: Payer: Self-pay

## 2017-01-27 NOTE — Telephone Encounter (Signed)
PT CALLING STATING THAT SHE NEED THE SHOWER BENCH AND THAT [facility] STATES THAT ALL THEY NEED IS THE RX FOR THE BENCH ONLY THAT THEY DON'T NEED ALL THE PAPERWORK THAT WE SENT OVER EARLIER  PATIENT CAN GO PICK BENCH UP WHEN SHE GO PICK UP HER WHEELCHAIR TODAY PLEASE FAX OVER RX ONLY

## 2017-01-27 NOTE — Telephone Encounter (Signed)
Please advise 

## 2017-01-27 NOTE — Telephone Encounter (Signed)
Spoke with [facility]. Per their records, wheelchair is ready to be picked up. They have already communicated this with the patient today and patient stated that she will be picking up chair today.

## 2017-01-27 NOTE — Telephone Encounter (Signed)
Complete

## 2017-01-28 ENCOUNTER — Other Ambulatory Visit: Payer: Self-pay | Admitting: Physician Assistant

## 2017-01-28 NOTE — Telephone Encounter (Signed)
I am not really sure how to order this but I tried.

## 2017-01-28 NOTE — Telephone Encounter (Signed)
Please fax

## 2017-01-28 NOTE — Telephone Encounter (Signed)
Successfully faxed

## 2017-01-28 NOTE — Telephone Encounter (Signed)
PATIENT IS CALLING SARAH BACK AGAIN TO INQUIRE ON HER SHOWER BENCH. [facility] TOLD HER THAT THEY HAVE NOT RECEIVED THE PRESCRIPTION YET. PATIENT SAID SHE REALLY NEEDS IT BECAUSE SHE IS AFRAID SHE MIGHT FALL. BEST PHONE [phone] (HOME) MBC

## 2017-01-28 NOTE — Telephone Encounter (Signed)
See note below

## 2017-02-02 ENCOUNTER — Telehealth: Payer: Self-pay | Admitting: Physician Assistant

## 2017-02-02 NOTE — Telephone Encounter (Signed)
PT CALLED TO ASK FOR ORDER CLARIFICATION ON DME: PLEASE ORDER "SHOWER BENCH"   [phone] PLEASE CONTACT

## 2017-02-03 NOTE — Telephone Encounter (Signed)
Patient needs an order for a shower bench asap and also she needs medication for her knee for pain states the injections are not working and she is hurting really bad.  Her call back number is [phone]

## 2017-02-03 NOTE — Telephone Encounter (Signed)
Sarah, please see this message. Thanks.

## 2017-02-03 NOTE — Telephone Encounter (Signed)
I do not know how to put in an order for a shower bench other than the one that I did.  Can it be called in - can someone figure out how to do this and get someone in the clinic to sign the order.  Which mediation is she needing for her knees since the injection did not work?  If Naproxen or Ultram either can be given for a month supply.

## 2017-02-05 ENCOUNTER — Other Ambulatory Visit: Payer: Self-pay | Admitting: Emergency Medicine

## 2017-02-05 DIAGNOSIS — Z7409 Other reduced mobility: Secondary | ICD-10-CM

## 2017-02-05 NOTE — Telephone Encounter (Signed)
Spoke with patient.  Sent in shower bench order to [facility]. Requesting Tramadol refill increase to 100mg  tab for pain. States, naproxen does not work.  I can get one of the providers to sign refill if okay.

## 2017-02-06 ENCOUNTER — Telehealth: Payer: Self-pay | Admitting: Physician Assistant

## 2017-02-06 NOTE — Telephone Encounter (Signed)
PT called to check the status of Tramadol req.// pt states her aid will be available after noon today to pick up rx  [phone]

## 2017-02-06 NOTE — Telephone Encounter (Signed)
Pt is checking on status of the wheel chair and that what was sent over to her today was the wrong thing

## 2017-02-09 ENCOUNTER — Other Ambulatory Visit: Payer: Self-pay | Admitting: Emergency Medicine

## 2017-02-09 DIAGNOSIS — Z741 Need for assistance with personal care: Secondary | ICD-10-CM

## 2017-02-09 MED ORDER — TRAMADOL HCL 50 MG PO TABS: 50.0000 mg | ORAL_TABLET | Freq: Two times a day (BID) | ORAL | 0 refills | Status: DC

## 2017-02-09 NOTE — Telephone Encounter (Signed)
[facility] chair

## 2017-02-09 NOTE — Telephone Encounter (Signed)
Done

## 2017-02-09 NOTE — Progress Notes (Signed)
Order corrected: [facility]

## 2017-02-09 NOTE — Telephone Encounter (Signed)
Left detailed message on VM that rx for tramadol was ready to be picked up at 102.

## 2017-02-12 ENCOUNTER — Telehealth: Payer: Self-pay | Admitting: Physician Assistant

## 2017-02-12 NOTE — Telephone Encounter (Signed)
Pt called & said that [name] ordered a wheelchair for her & [facility] gave her a wheelchair that is to big/heavy for her to use. She wants to know if it can be reordered for the size that is easy for her to use.  Please Advise  [phone]

## 2017-02-12 NOTE — Telephone Encounter (Signed)
See note below

## 2017-02-13 ENCOUNTER — Telehealth: Payer: Self-pay

## 2017-02-13 NOTE — Telephone Encounter (Signed)
Pharmacy calling to clarify directions on pts Tramadol. Confirmation given of 1-2 tablets.

## 2017-02-13 NOTE — Telephone Encounter (Signed)
Can someone call [facility] and see which wheelchair she got.  Also ask them if the patient can come into their office to try the wheelchairs to find one that she likes.  I ordered a generic wheel chair and made an assumption that she would get the pick one that worked best for her.

## 2017-02-19 ENCOUNTER — Telehealth: Payer: Self-pay

## 2017-02-19 DIAGNOSIS — M159 Polyosteoarthritis, unspecified: Secondary | ICD-10-CM

## 2017-02-19 NOTE — Telephone Encounter (Signed)
Spoke with Amy Gallagher at [facility] - Order for Amy Gallagher needs to be re faxed. Include the same as routine and change for lightweight wheelchair and possibly smaller size 16x16. Order put in and faxed to [phone]

## 2017-02-27 ENCOUNTER — Ambulatory Visit: Payer: Medicare Other

## 2017-03-07 ENCOUNTER — Other Ambulatory Visit: Payer: Self-pay | Admitting: Physician Assistant

## 2017-04-16 ENCOUNTER — Telehealth: Payer: Self-pay | Admitting: Physician Assistant

## 2017-04-17 ENCOUNTER — Other Ambulatory Visit: Payer: Self-pay

## 2017-04-17 MED ORDER — ALBUTEROL SULFATE HFA 108 (90 BASE) MCG/ACT IN AERS: 2.0000 | INHALATION_SPRAY | RESPIRATORY_TRACT | 0 refills | Status: DC | PRN

## 2017-04-17 NOTE — Telephone Encounter (Signed)
Rx sent in

## 2017-04-17 NOTE — Telephone Encounter (Signed)
Patient calling to f/u on Inhaler ProAir refill request

## 2017-04-27 ENCOUNTER — Encounter: Payer: Self-pay | Admitting: Physician Assistant

## 2017-04-27 ENCOUNTER — Ambulatory Visit: Payer: Medicare Other | Admitting: Physician Assistant

## 2017-04-27 ENCOUNTER — Ambulatory Visit (INDEPENDENT_AMBULATORY_CARE_PROVIDER_SITE_OTHER): Payer: Medicare Other | Admitting: Physician Assistant

## 2017-04-27 VITALS — BP 142/78 | HR 64 | Temp 98.1°F | Resp 18 | Ht 63.0 in | Wt 192.0 lb

## 2017-04-27 DIAGNOSIS — Z23 Encounter for immunization: Secondary | ICD-10-CM | POA: Diagnosis not present

## 2017-04-27 DIAGNOSIS — R350 Frequency of micturition: Secondary | ICD-10-CM | POA: Diagnosis not present

## 2017-04-27 DIAGNOSIS — Z79899 Other long term (current) drug therapy: Secondary | ICD-10-CM

## 2017-04-27 DIAGNOSIS — M159 Polyosteoarthritis, unspecified: Secondary | ICD-10-CM

## 2017-04-27 DIAGNOSIS — R062 Wheezing: Secondary | ICD-10-CM | POA: Diagnosis not present

## 2017-04-27 DIAGNOSIS — I10 Essential (primary) hypertension: Secondary | ICD-10-CM

## 2017-04-27 DIAGNOSIS — J45909 Unspecified asthma, uncomplicated: Secondary | ICD-10-CM | POA: Diagnosis not present

## 2017-04-27 LAB — POCT URINALYSIS DIP (MANUAL ENTRY)
BILIRUBIN UA: NEGATIVE mg/dL
Bilirubin, UA: NEGATIVE
Glucose, UA: NEGATIVE mg/dL
Nitrite, UA: NEGATIVE
RBC UA: NEGATIVE
SPEC GRAV UA: 1.015 (ref 1.010–1.025)
UROBILINOGEN UA: 0.2 U/dL
pH, UA: 8.5 — AB (ref 5.0–8.0)

## 2017-04-27 MED ORDER — DICLOFENAC SODIUM 75 MG PO TBEC: 75.0000 mg | DELAYED_RELEASE_TABLET | Freq: Two times a day (BID) | ORAL | 0 refills | Status: DC

## 2017-04-27 MED ORDER — CIPROFLOXACIN HCL 500 MG PO TABS: 500.0000 mg | ORAL_TABLET | Freq: Two times a day (BID) | ORAL | 0 refills | Status: DC

## 2017-04-27 MED ORDER — WHEELCHAIR MISC: 1.0000 | Freq: Every day | 0 refills | Status: DC

## 2017-04-27 MED ORDER — ALBUTEROL SULFATE (2.5 MG/3ML) 0.083% IN NEBU: 2.5000 mg | INHALATION_SOLUTION | Freq: Two times a day (BID) | RESPIRATORY_TRACT | 1 refills | Status: DC | PRN

## 2017-04-27 NOTE — Patient Instructions (Addendum)
     IF you received an x-ray today, you will receive an invoice from [facility]. Please contact [facility] at [phone] with questions or concerns regarding your invoice.   IF you received labwork today, you will receive an invoice from [facility]. Please contact [facility] at [phone] with questions or concerns regarding your invoice.   Our billing staff will not be able to assist you with questions regarding bills from these companies.  You will be contacted with the lab results as soon as they are available. The fastest way to get your results is to activate your My Chart account. Instructions are located on the last page of this paperwork. If you have not heard from [place] regarding the results in 2 weeks, please contact this office.    Stop naproxen - start diclofenac to see if that will help better (with food - in the am with breakfast and at 6pm with big snack -- use the tramadol at night - every night to help with pain.

## 2017-04-27 NOTE — Progress Notes (Signed)
Amy Gallagher  MRN: [id number] DOB: 1930-02-20  PCP: Mancel Bale, PA-C  Chief Complaint  Patient presents with  . Urinary Frequency    and has odor     Subjective:  Pt presents to clinic for lung problems and urine problems and need for some medical equipment.    Urinary complaints for a month - no dysuria - strong odor - normal color up until today she felt like it was darker - going more often and leaking more.  No F/C.  No abd pain or back pain.  She feels like she has bronchitis - feels like she has mucus in her chest - she is using her albuterol inhaler every 4 hours - she feels like she is using it correctly - she feels SOB and is wheezing.  She has been using a nebulizer and it seems to help a lot more than her inhaler.  She is only taking HCTZ 12.5 due to current increase in urination.  History is obtained by patient.  Needs a transporter wheelchair.    Review of Systems  Constitutional: Negative for chills and fever.  Gastrointestinal: Negative for abdominal pain.  Genitourinary: Positive for frequency and urgency. Negative for dysuria.  Musculoskeletal: Negative for back pain.    Patient Active Problem List   Diagnosis Date Noted  . Urinary urgency 06/10/2016  . Environmental allergies 08/14/2011  . Gout 09/10/2006  . DEPRESSION, MAJOR, RECURRENT 09/10/2006  . Former smoker 09/10/2006  . HYPERTENSION, BENIGN SYSTEMIC 09/10/2006  . Mild intermittent asthma 09/10/2006  . REFLUX ESOPHAGITIS 09/10/2006  . Arthritis 09/10/2006    Current Outpatient Prescriptions on File Prior to Visit  Medication Sig Dispense Refill  . albuterol (PROAIR HFA) 108 (90 Base) MCG/ACT inhaler Inhale 2 puffs into the lungs every 4 (four) hours as needed for wheezing or shortness of breath. 8.5 Inhaler 0  . allopurinol (ZYLOPRIM) 100 MG tablet TAKE 1 TABLET (100 MG TOTAL) BY MOUTH DAILY. 90 tablet 0  . ALPRAZolam (XANAX) 0.25 MG tablet Take 1 tablet (0.25 mg total) by mouth at  bedtime as needed. 30 tablet 0  . fluticasone (FLONASE) 50 MCG/ACT nasal spray PLACE 2 SPRAYS INTO BOTH NOSTRILS DAILY. 48 g 1  . hydrochlorothiazide (HYDRODIURIL) 25 MG tablet TAKE 1 TABLET BY MOUTH DAILY. OFFICE VISIT NEEDED FOR REFILLS 30 tablet 0  . Multiple Vitamin (MULTIVITAMIN WITH MINERALS) TABS tablet Take 1 tablet by mouth daily.    . pantoprazole (PROTONIX) 40 MG tablet TAKE 1 TABLET BY MOUTH DAILY 90 tablet 0  . traMADol (ULTRAM) 50 MG tablet Take 1-2 tablets (50-100 mg total) by mouth 2 (two) times daily. TAKE 1 TABLET BY MOUTH EVERY 12 HOURS AS NEEDED 60 tablet 0  . trolamine salicylate (ASPERCREME/ALOE) 10 % cream Apply 1 application topically as needed for muscle pain. 85 g 0  . vitamin C (ASCORBIC ACID) 500 MG tablet Take 500 mg by mouth daily.     No current facility-administered medications on file prior to visit.     Allergies  Allergen Reactions  . Aspirin Nausea And Vomiting  . Penicillins Swelling    Past Medical History:  Diagnosis Date  . Arthritis   . Bronchitis   . Gout    Social History   Social History Narrative   Patient cares for her mentally 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    Walks with a rolling walker.    Social History  Substance Use Topics  .  Smoking status: Former Smoker    Types: Cigarettes    Quit date: 02/26/1997  . Smokeless tobacco: Never Used  . Alcohol use No   family history is not on file.     Objective:  BP (!) 142/78   Pulse 64   Temp 98.1 F (36.7 C) (Oral)   Resp 18   Ht _0  (1.6 m)   Wt 192 lb (87.1 kg)   SpO2 98%   BMI 34.01 kg/m  Body mass index is 34.01 kg/m.  Physical Exam  Constitutional: She is oriented to person, place, and time and well-developed, well-nourished, and in no distress.  HENT:  Head: Normocephalic and atraumatic.  Right Ear: Hearing, tympanic membrane, external ear and ear canal normal.  Left Ear: Hearing, tympanic membrane, external ear and ear  canal normal.  Nose: Nose normal.  Mouth/Throat: Uvula is midline, oropharynx is clear and moist and mucous membranes are normal.  Eyes: Conjunctivae are normal.  Neck: Normal range of motion.  Cardiovascular: Normal rate, regular rhythm and normal heart sounds.   No murmur heard. Pulmonary/Chest: Effort normal and breath sounds normal.  Abdominal: Soft. There is no tenderness. There is no CVA tenderness.  Neurological: She is alert and oriented to person, place, and time. Gait normal.  Skin: Skin is warm and dry.  Psychiatric: Mood, memory, affect and judgment normal.  Vitals reviewed.   Results for orders placed or performed in visit on 04/27/17  POCT urinalysis dipstick  Result Value Ref Range   Color, UA yellow yellow   Clarity, UA cloudy (A) clear   Glucose, UA negative negative mg/dL   Bilirubin, UA negative negative   Ketones, POC UA negative negative mg/dL   Spec Grav, UA 1.015 1.010 - 1.025   Blood, UA negative negative   pH, UA 8.5 (A) 5.0 - 8.0   Protein Ur, POC trace (A) negative mg/dL   Urobilinogen, UA 0.2 0.2 or 1.0 E.U./dL   Nitrite, UA Negative Negative   Leukocytes, UA Trace (A) Negative    Assessment and Plan :  Frequency of urination - Plan: POCT urinalysis dipstick, ciprofloxacin (CIPRO) 500 MG tablet - due to leukocytes will cover for UTI  Osteoarthritis of multiple joints, unspecified osteoarthritis type - Plan: diclofenac (VOLTAREN) 75 MG EC tablet, Misc. Devices [facility]) MISC - naproxen does not seem to be helping and she liked the voltaren gel but it was really hard for her to apply so we will try the oral to see if that would help  Flu vaccine need  Wheezing - Plan: albuterol (PROVENTIL) (2.5 MG/3ML) 0.083% nebulizer solution, For home use only DME Nebulizer machine --- as allergy season goes on hopefully the use of this will be able to go down  Persistent asthma without complication, unspecified asthma severity - Plan: albuterol (PROVENTIL)  (2.5 MG/3ML) 0.083% nebulizer solution, For home use only DME Nebulizer machine - due to smoking history COPD is a possibility  High risk medication use - Plan: CMP14+EGFR  Needs flu shot - Plan: Flu Vaccine QUAD 36+ mos IM  Essential hypertension - Plan: CMP14+EGFR - controlled -- continue current medications - ok to stay on half dose of HCTZ -   Windell Hummingbird PA-C  Primary Care at [place] 04/30/2017 12:23 PM

## 2017-05-02 ENCOUNTER — Telehealth: Payer: Self-pay | Admitting: Physician Assistant

## 2017-05-02 NOTE — Telephone Encounter (Signed)
Pt called stated that the pharmacy told her in order to fill the prescription that she would need a code and for weber to rewrite the prescription.. I didn't understand.. Put pt on hold to go ask and pt disconnected call..Marland Kitchen

## 2017-05-02 NOTE — Telephone Encounter (Signed)
[facility] to see what pt was talking about.Kirkland Hun[facility] stated that pt medicare part D will not cover albuterol (PROVENTIL) (2.5 MG/3ML) 0.083% nebulizer solution [[id number]][[id number]] but pt medicare part B will cover it and per the pharmacy in order to fill this medication under pt's medicare part B it has to have the correct ICD-10 code.. Pt is having a really hard time breathing and really needs this medication, she was wheezing really bad over the phone..  Please advise

## 2017-05-04 NOTE — Telephone Encounter (Signed)
ICD10 codes given to pharmacy for albuterol. Left detailed VM for patient regarding fill.

## 2017-05-15 ENCOUNTER — Telehealth: Payer: Self-pay

## 2017-05-15 NOTE — Telephone Encounter (Signed)
Called pt to schedule Medicare Annual Wellness Visit. -nr  

## 2017-05-22 ENCOUNTER — Other Ambulatory Visit: Payer: Self-pay | Admitting: Physician Assistant

## 2017-05-25 NOTE — Telephone Encounter (Signed)
Sarah , I don't see this has been filled recently.    Please advise

## 2017-05-25 NOTE — Telephone Encounter (Signed)
Copied from CRM [id number]. Topic: Quick Communication - See Telephone Encounter >> May 25, 2017 11:15 AM Viviann SpareWhite, Selina wrote: CRM for notification. See Telephone encounter for: 05/25/17.  Pt call to get her colchicine .6 mg refill. Pt gout is acting up and she is having a hard time walking.

## 2017-05-26 ENCOUNTER — Other Ambulatory Visit: Payer: Self-pay | Admitting: Physician Assistant

## 2017-05-27 MED ORDER — COLCHICINE 0.6 MG PO TABS: 0.6000 mg | ORAL_TABLET | Freq: Every day | ORAL | 0 refills | Status: DC

## 2017-05-27 NOTE — Telephone Encounter (Signed)
Done

## 2017-05-28 ENCOUNTER — Other Ambulatory Visit: Payer: Self-pay | Admitting: Physician Assistant

## 2017-06-03 ENCOUNTER — Telehealth: Payer: Self-pay

## 2017-06-03 ENCOUNTER — Ambulatory Visit: Payer: Medicare Other | Admitting: Physician Assistant

## 2017-06-03 DIAGNOSIS — Z79899 Other long term (current) drug therapy: Secondary | ICD-10-CM

## 2017-06-03 NOTE — Progress Notes (Signed)
Labs only

## 2017-06-03 NOTE — Addendum Note (Signed)
Addended by: Baldwin CrownJOHNSON, Caliah Kopke D on: 06/03/2017 02:48 PM   Modules accepted: Orders

## 2017-06-03 NOTE — Telephone Encounter (Signed)
Pt is requesting refill for Naproxen and Allopurinol.  Naproxen was cancelled at last visit and pt states does not want to continue the replacement.  Would like Naproxen refilled.  Allopurinol hasn't been filled since 12/2015.  Pt is requesting a call when refill sent to pharmacy.

## 2017-06-03 NOTE — Addendum Note (Signed)
Addended by: Baldwin CrownJOHNSON, Zanaya Baize D on: 06/03/2017 02:47 PM   Modules accepted: Orders

## 2017-06-04 LAB — CMP14+EGFR
A/G RATIO: 1.5 (ref 1.2–2.2)
ALK PHOS: 67 IU/L (ref 39–117)
ALT: 15 IU/L (ref 0–32)
AST: 22 IU/L (ref 0–40)
Albumin: 4.4 g/dL (ref 3.5–4.7)
BUN/Creatinine Ratio: 16 (ref 12–28)
BUN: 14 mg/dL (ref 8–27)
Bilirubin Total: 0.3 mg/dL (ref 0.0–1.2)
CO2: 29 mmol/L (ref 20–29)
Calcium: 10.1 mg/dL (ref 8.7–10.3)
Chloride: 100 mmol/L (ref 96–106)
Creatinine, Ser: 0.9 mg/dL (ref 0.57–1.00)
GFR calc Af Amer: 67 mL/min/{1.73_m2} (ref >59)
GFR calc non Af Amer: 58 mL/min/{1.73_m2} — ABNORMAL LOW (ref >59)
GLOBULIN, TOTAL: 2.9 g/dL (ref 1.5–4.5)
Glucose: 77 mg/dL (ref 65–99)
POTASSIUM: 5.1 mmol/L (ref 3.5–5.2)
SODIUM: 142 mmol/L (ref 134–144)
Total Protein: 7.3 g/dL (ref 6.0–8.5)

## 2017-06-05 ENCOUNTER — Other Ambulatory Visit: Payer: Self-pay | Admitting: Physician Assistant

## 2017-06-06 ENCOUNTER — Ambulatory Visit: Payer: Medicare Other | Admitting: Physician Assistant

## 2017-06-06 NOTE — Telephone Encounter (Signed)
Please advise 

## 2017-06-08 DIAGNOSIS — M79674 Pain in right toe(s): Secondary | ICD-10-CM | POA: Diagnosis not present

## 2017-06-08 DIAGNOSIS — B351 Tinea unguium: Secondary | ICD-10-CM | POA: Diagnosis not present

## 2017-06-08 DIAGNOSIS — M79675 Pain in left toe(s): Secondary | ICD-10-CM | POA: Diagnosis not present

## 2017-06-09 ENCOUNTER — Encounter: Payer: Self-pay | Admitting: Physician Assistant

## 2017-06-09 MED ORDER — NAPROXEN 500 MG PO TABS: 500.0000 mg | ORAL_TABLET | Freq: Two times a day (BID) | ORAL | 2 refills | Status: DC | PRN

## 2017-06-09 NOTE — Telephone Encounter (Signed)
Meds ordered this encounter  Medications  . allopurinol (ZYLOPRIM) 100 MG tablet    Sig: TAKE 1 TABLET BY MOUTH EVERY DAY    Dispense:  90 tablet    Refill:  0

## 2017-06-09 NOTE — Telephone Encounter (Signed)
Allopurinol refilled already today.  Meds ordered this encounter  Medications  . naproxen (NAPROSYN) 500 MG tablet    Sig: Take 1 tablet (500 mg total) by mouth 2 (two) times daily as needed.    Dispense:  60 tablet    Refill:  2    Order Specific Question:   Supervising Provider    Answer:   Clelia CroftSHAW, EVA N [[id number]]

## 2017-06-11 ENCOUNTER — Other Ambulatory Visit: Payer: Self-pay | Admitting: Physician Assistant

## 2017-06-11 DIAGNOSIS — M109 Gout, unspecified: Secondary | ICD-10-CM

## 2017-06-11 NOTE — Telephone Encounter (Signed)
Not sure how to reorder  this.  Asking for grams.

## 2017-06-27 NOTE — Progress Notes (Signed)
Letter sent.

## 2017-07-23 ENCOUNTER — Other Ambulatory Visit: Payer: Self-pay | Admitting: Physician Assistant

## 2017-07-29 ENCOUNTER — Ambulatory Visit: Payer: Medicare Other | Admitting: Physician Assistant

## 2017-07-29 ENCOUNTER — Ambulatory Visit: Payer: Medicare Other

## 2017-08-07 ENCOUNTER — Ambulatory Visit (INDEPENDENT_AMBULATORY_CARE_PROVIDER_SITE_OTHER): Payer: Medicare Other | Admitting: Physician Assistant

## 2017-08-07 ENCOUNTER — Other Ambulatory Visit: Payer: Self-pay | Admitting: Physician Assistant

## 2017-08-07 ENCOUNTER — Ambulatory Visit (INDEPENDENT_AMBULATORY_CARE_PROVIDER_SITE_OTHER): Payer: Medicare Other

## 2017-08-07 ENCOUNTER — Telehealth: Payer: Self-pay | Admitting: Physician Assistant

## 2017-08-07 VITALS — BP 186/79 | HR 65 | Temp 98.2°F | Resp 16 | Ht 63.0 in

## 2017-08-07 DIAGNOSIS — M545 Low back pain, unspecified: Secondary | ICD-10-CM

## 2017-08-07 DIAGNOSIS — R252 Cramp and spasm: Secondary | ICD-10-CM

## 2017-08-07 DIAGNOSIS — M4310 Spondylolisthesis, site unspecified: Secondary | ICD-10-CM

## 2017-08-07 DIAGNOSIS — M109 Gout, unspecified: Secondary | ICD-10-CM

## 2017-08-07 MED ORDER — BENZONATATE 100 MG PO CAPS: 100.0000 mg | ORAL_CAPSULE | Freq: Three times a day (TID) | ORAL | 0 refills | Status: DC | PRN

## 2017-08-07 MED ORDER — BACLOFEN 5 MG PO TABS: 1.0000 | ORAL_TABLET | Freq: Three times a day (TID) | ORAL | 0 refills | Status: DC
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
## 2017-08-07 NOTE — Telephone Encounter (Signed)
Copied from CRM [id number]. Topic: Quick Communication - See Telephone Encounter >> Aug 07, 2017 10:26 AM Oneal GroutSebastian, Jennifer S wrote: CRM for notification. See Telephone encounter for:  Checking status of paperwork from [facility] for depends supply 08/07/17.
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
## 2017-08-08 LAB — BASIC METABOLIC PANEL
BUN / CREAT RATIO: 15 (ref 12–28)
BUN: 12 mg/dL (ref 8–27)
CO2: 28 mmol/L (ref 20–29)
Calcium: 10 mg/dL (ref 8.7–10.3)
Chloride: 101 mmol/L (ref 96–106)
Creatinine, Ser: 0.82 mg/dL (ref 0.57–1.00)
GFR calc non Af Amer: 65 mL/min/{1.73_m2} (ref >59)
GFR, EST AFRICAN AMERICAN: 75 mL/min/{1.73_m2} (ref >59)
GLUCOSE: 81 mg/dL (ref 65–99)
Potassium: 5 mmol/L (ref 3.5–5.2)
SODIUM: 142 mmol/L (ref 134–144)

## 2017-08-10 NOTE — Telephone Encounter (Signed)
Pt is calling back in regards to this. Please advise. 

## 2017-08-11 MED ORDER — TRAMADOL HCL 50 MG PO TABS: 50.0000 mg | ORAL_TABLET | Freq: Three times a day (TID) | ORAL | 0 refills | Status: DC | PRN

## 2017-08-11 NOTE — Telephone Encounter (Signed)
Patient checking on status of paperwork for [facility], please advise

## 2017-08-11 NOTE — Telephone Encounter (Signed)
Please advise patient tramadol was sent.  Advised to return in 2 weeks as she was advised.  Please warn of sedation.  Do not take the baclofen and the tramadol at the same time.

## 2017-08-11 NOTE — Telephone Encounter (Signed)
-----   Message from Morrell RiddleSarah L Weber, PA-C sent at 08/11/2017  8:33 AM EST ----- Will you review her request for pain medications.

## 2017-08-17 ENCOUNTER — Telehealth: Payer: Self-pay

## 2017-08-17 NOTE — Telephone Encounter (Signed)
Copied from CRM [phone] [id number]. Topic: Inquiry >> Aug 14, 2017  9:15 AM Yvonna Alanisobinson, Andra M wrote: Reason for CRM: Melanie with [facility] called regarding paperwork for the patient to receive her supply of Depends. Shawna OrleansMelanie states that they have been waiting since November 2018 for this Paperwork. Please call Shawna OrleansMelanie of [facility] at [phone]. Thank You!!!

## 2017-08-18 ENCOUNTER — Other Ambulatory Visit: Payer: Self-pay | Admitting: Physician Assistant

## 2017-08-18 DIAGNOSIS — G4700 Insomnia, unspecified: Secondary | ICD-10-CM

## 2017-08-18 NOTE — Telephone Encounter (Signed)
alprazolam refill Last OV: 04/27/17 Last Refill: 07/09/17 [facility] [address] [place]

## 2017-08-19 ENCOUNTER — Telehealth: Payer: Self-pay | Admitting: *Deleted

## 2017-08-19 NOTE — Telephone Encounter (Signed)
Patient calling and requesting to speak to someone to see if all the forms were received. Call back [phone]

## 2017-08-19 NOTE — Telephone Encounter (Signed)
Patient is stating they still have not received the forms for her medical supplies.

## 2017-08-19 NOTE — Telephone Encounter (Signed)
Successfully faxed and pt notified

## 2017-08-19 NOTE — Telephone Encounter (Signed)
Successful notification faxed 08/19/2017 at 9;24am

## 2017-08-19 NOTE — Telephone Encounter (Signed)
Phone call from [place]anie at [facility].  Has received paperwork this morning but it is not correct paperwork.  Shawna OrleansMelanie will fax over new medical necessity form that needs to be completed by Benny LennertSarah Weber, PA.

## 2017-08-19 NOTE — Telephone Encounter (Signed)
Per Raynelle FanningJulie forms recent and confirmed receipt

## 2017-08-20 ENCOUNTER — Telehealth: Payer: Self-pay

## 2017-08-20 ENCOUNTER — Telehealth: Payer: Self-pay | Admitting: Physician Assistant

## 2017-08-20 NOTE — Telephone Encounter (Signed)
Left detailed message on vm per Hippa Release, letting pt know we did receive all forms and faxed them back with confirmation they went through and to call us if she has contacted them and they haven't gotten them

## 2017-08-20 NOTE — Telephone Encounter (Signed)
alprazolam refill Last OV: 11/08/15 Last Refill:06/24/16 #30 [facility] [address]

## 2017-08-20 NOTE — Telephone Encounter (Signed)
Phone call to patient to notify her purpose of call was to let her know paperwork has been faxed to [facility], see phone encounter dated yesterday.   Unable to reach patient. If patient calls back, please let her know that her paperwork has been faxed to [facility].   Copied from CRM [id number]. Topic: General - Call Back - No Documentation >> Aug 20, 2017  9:15 AM Jolayne Hainesaylor, Brittany L wrote: Patient said she missed a call from office. Please call back [phone]

## 2017-08-20 NOTE — Telephone Encounter (Signed)
Copied from CRM [id number]. Topic: Quick Communication - Rx Refill/Question >> Aug 20, 2017 11:27 AM [name], [name], NT wrote: Medication: Xanax   Has the patient contacted their pharmacy? Yes   Patient calling because she still needs her refill on Xanax. Patient has been in contact with her pharmacy and was told that they haven't received anything from the office about her medications yet  Preferred Pharm[facility] (with phone number or street name[facility] [address] [place] [phone]   Agent: Please be advised that RX refills may take up to 3 business days. We ask that you follow-up with your pharmacy.

## 2017-08-20 NOTE — Telephone Encounter (Signed)
Pt needs to be seen has been over a year.

## 2017-08-21 ENCOUNTER — Encounter: Payer: Self-pay | Admitting: Physician Assistant

## 2017-08-21 NOTE — Telephone Encounter (Signed)
Paperwork was faxed 

## 2017-08-21 NOTE — Telephone Encounter (Signed)
Left message  Called pt to let her know that her RX had been filled but [name] does need to see her for an OV. I asked her to call into the office and make an appt to see her.

## 2017-08-21 NOTE — Telephone Encounter (Signed)
Done

## 2017-08-21 NOTE — Telephone Encounter (Signed)
I refilled but she needs to have an appt with me.

## 2017-08-24 NOTE — Telephone Encounter (Signed)
Pt called and states after speaking w/ [facility] that they state they havent received any paperwork, contact pt or [facility] about this to get resolved

## 2017-08-25 ENCOUNTER — Other Ambulatory Visit: Payer: Self-pay | Admitting: Physician Assistant

## 2017-08-25 NOTE — Telephone Encounter (Signed)
Copied from CRM [id number]. Topic: General - Other >> Aug 25, 2017 11:45 AM Debroah LoopLander, Lumin L wrote: Reason for CRM: Shawna OrleansMelanie, w/ [facility] calling about ppw that needed to be completed. It's a CMN (certificate of medical necessity) for incontenent supplies. She said Karren BurlyChandra told her to call if it was not received. Fax: [phone]

## 2017-08-25 NOTE — Telephone Encounter (Signed)
[facility] but office is closed, will call back due to not receiving right forms.

## 2017-08-25 NOTE — Telephone Encounter (Signed)
Called [facility] again and got another fax number and paperwork has been successfully faxed.

## 2017-08-25 NOTE — Telephone Encounter (Signed)
Amy Gallagher with [facility] calling and statOwens Corninges that the info that was faxed back to her today was not the paperwork that she faxed over last week to [place]. The forms she received back was 2017 forms. She needs the correct forms faxed back asap. The forms that she sent over are dated for 2018. Dates listed on the forms are starting from November of 2018. Please call Amy Gallagher to clear this up. CB#: [phone]. Fax: [phone]

## 2017-08-28 ENCOUNTER — Ambulatory Visit: Payer: Medicare Other | Admitting: Physician Assistant

## 2017-08-28 NOTE — Telephone Encounter (Signed)
Pt. Calling regarding her medical supplies,  Please call pt and let her know.

## 2017-08-28 NOTE — Telephone Encounter (Signed)
Please advise, thank you.

## 2017-08-28 NOTE — Telephone Encounter (Signed)
Spoke with patient and made her aware of provider notes. Patient states that she will be making another appointment next week.

## 2017-08-31 ENCOUNTER — Telehealth: Payer: Self-pay | Admitting: Physician Assistant

## 2017-08-31 NOTE — Telephone Encounter (Signed)
Patient would like someone to come out to her house for PT.Marland Kitchen.   Please advise thanks

## 2017-09-01 NOTE — Telephone Encounter (Signed)
We [facility] on 2/6 but they stated in referral that pt is requesting someone to come out to her house for PT.

## 2017-09-01 NOTE — Telephone Encounter (Signed)
Did she go to the physical therapy I arranged?   This has almost been 4 weeks ago.

## 2017-09-04 ENCOUNTER — Other Ambulatory Visit: Payer: Self-pay | Admitting: Physician Assistant

## 2017-09-04 DIAGNOSIS — M545 Low back pain, unspecified: Secondary | ICD-10-CM

## 2017-09-04 DIAGNOSIS — M4310 Spondylolisthesis, site unspecified: Secondary | ICD-10-CM

## 2017-09-04 NOTE — Telephone Encounter (Signed)
Sent.  Thank you. Can you either call patient, or then route to clinical message pool, that patient does need to return for recheck, and to also recheck bp.
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
## 2017-09-14 ENCOUNTER — Other Ambulatory Visit: Payer: Self-pay | Admitting: Physician Assistant

## 2017-09-18 ENCOUNTER — Telehealth: Payer: Self-pay | Admitting: Physician Assistant

## 2017-09-18 NOTE — Telephone Encounter (Signed)
Copied from CRM [id number]. Topic: General - Other >> Sep 18, 2017  3:33 PM Percival SpanishKennedy, Cheryl W wrote:  Nino GlowAlmira a PT  with [facility] call to say she will move pt PT to 09/21/17 do to not being able to reach pt   [phone]

## 2017-09-18 NOTE — Telephone Encounter (Signed)
Copied from CRM #[id number]. Topic: General - Other °>> Sep 18, 2017  3:33 PM Kennedy, Cheryl W wrote: ° °Almira a PT  with [facility] call to say she will move pt PT to 09/21/17 do to not being able to reach pt  ° [phone] °

## 2017-09-21 ENCOUNTER — Telehealth: Payer: Self-pay | Admitting: Physician Assistant

## 2017-09-21 NOTE — Telephone Encounter (Signed)
Copied from CRM [phone]. Topic: Inquiry >> Sep 21, 2017  1:07 PM Maia Pettiesrtiz, Kristie S wrote: Reason for CRM: requesting VO to move [facility] to Wednesday 09/23/17 as pt is currently out of town. OK to leave msg on confidential VM

## 2017-09-21 NOTE — Telephone Encounter (Signed)
Noted  

## 2017-09-22 NOTE — Telephone Encounter (Signed)
[facility] OK to move PT appt to tomorrow. She is agreeable, verbalizes understanding.

## 2017-09-24 DIAGNOSIS — J4521 Mild intermittent asthma with (acute) exacerbation: Secondary | ICD-10-CM | POA: Diagnosis not present

## 2017-09-24 DIAGNOSIS — F330 Major depressive disorder, recurrent, mild: Secondary | ICD-10-CM | POA: Diagnosis not present

## 2017-09-24 DIAGNOSIS — M545 Low back pain: Secondary | ICD-10-CM | POA: Diagnosis not present

## 2017-09-24 DIAGNOSIS — I10 Essential (primary) hypertension: Secondary | ICD-10-CM | POA: Diagnosis not present

## 2017-09-24 DIAGNOSIS — M6281 Muscle weakness (generalized): Secondary | ICD-10-CM | POA: Diagnosis not present

## 2017-09-24 DIAGNOSIS — M4316 Spondylolisthesis, lumbar region: Secondary | ICD-10-CM | POA: Diagnosis not present

## 2017-09-25 ENCOUNTER — Telehealth: Payer: Self-pay | Admitting: Physician Assistant

## 2017-09-25 ENCOUNTER — Other Ambulatory Visit: Payer: Self-pay

## 2017-09-25 MED ORDER — ALLOPURINOL 100 MG PO TABS: 100.0000 mg | ORAL_TABLET | Freq: Every day | ORAL | 0 refills | Status: DC

## 2017-09-25 NOTE — Telephone Encounter (Signed)
Copied from CRM [id number]. Topic: Quick Communication - See Telephone Encounter >> Sep 25, 2017 10:05 AM Jolayne Hainesaylor, Brittany L wrote: CRM for notification. See Telephone encounter for:   09/25/17. Almira Physical Therapist from [facility] would like verbal orders to see her twice a week for 4 weeks starting next week Cb [phone] Pt is taking traMADol (ULTRAM) 50 MG tablet but still in a lot of pain, it did not ease her pain at all.

## 2017-09-28 NOTE — Telephone Encounter (Signed)
Left to call back to ok Verbal order.  It is ok to give Verbal order for PT .Marland Kitchen..Marland Kitchen

## 2017-10-09 ENCOUNTER — Telehealth: Payer: Self-pay

## 2017-10-09 NOTE — Telephone Encounter (Signed)
Form completed by Benny LennertSarah Weber ? Duplicate for [facility] request for PA  Underpads, underwear/protective  Spoke to [facility] & they confirmed they have form. Copy placed for scan into chart.

## 2017-10-12 ENCOUNTER — Other Ambulatory Visit: Payer: Self-pay | Admitting: Physician Assistant

## 2017-10-13 ENCOUNTER — Encounter: Payer: Self-pay | Admitting: Physician Assistant

## 2017-10-15 ENCOUNTER — Telehealth: Payer: Self-pay

## 2017-10-15 NOTE — Telephone Encounter (Signed)
[place]ira with physical therapy and called to request PT referral for patient. Pt is having increased back pain as well as leg pain.

## 2017-10-16 ENCOUNTER — Other Ambulatory Visit: Payer: Self-pay

## 2017-10-16 MED ORDER — HYDROCHLOROTHIAZIDE 25 MG PO TABS: ORAL_TABLET | ORAL | 0 refills | Status: DC

## 2017-10-19 NOTE — Telephone Encounter (Signed)
[facility]. Relayed provider message below, she verbalizes understanding. Will have patient call to make office visit.

## 2017-10-19 NOTE — Telephone Encounter (Signed)
I do not know which physical therapy this is.  Please have patient follow up with any provider at this time. She should have been to physical therapy.  I placed this in January. We need to revisit and get an understanding of what her treatment has been and the status of her symptoms.

## 2017-10-22 ENCOUNTER — Telehealth: Payer: Self-pay

## 2017-10-22 NOTE — Telephone Encounter (Signed)
Copied from CRM [id number]. Topic: General - Other >> Oct 22, 2017  2:51 PM Debroah LoopLander, Lumin L wrote: Reason for CRM: Bennetta Laosan Johnson PT with [facility] calling to discuss the patient missing her physical therapy appointment. She has been "dodging" them.

## 2017-10-22 NOTE — Telephone Encounter (Signed)
Provider, Lorain ChildesFYI. Patient has already been advised to make an office visit.

## 2017-10-23 NOTE — Telephone Encounter (Signed)
Noted  

## 2017-11-23 ENCOUNTER — Other Ambulatory Visit: Payer: Self-pay | Admitting: Physician Assistant

## 2017-11-27 ENCOUNTER — Other Ambulatory Visit: Payer: Self-pay | Admitting: Physician Assistant

## 2017-11-27 NOTE — Telephone Encounter (Signed)
Protonix refill request  LOV 08/07/17 with Judeth Cornfield English   No mention of this medication for the past year.  Looks like Benny Lennert renews it.

## 2017-11-28 ENCOUNTER — Telehealth: Payer: Self-pay | Admitting: Physician Assistant

## 2017-12-01 ENCOUNTER — Other Ambulatory Visit: Payer: Self-pay | Admitting: Physician Assistant

## 2017-12-01 DIAGNOSIS — M109 Gout, unspecified: Secondary | ICD-10-CM

## 2017-12-04 NOTE — Telephone Encounter (Signed)
Pt needs to have [place] call the ins co for PA \ [phone]

## 2017-12-08 ENCOUNTER — Telehealth: Payer: Self-pay | Admitting: Physician Assistant

## 2017-12-08 NOTE — Telephone Encounter (Signed)
Copied from CRM [id number]. Topic: Quick Communication - See Telephone Encounter >> Dec 08, 2017 12:15 PM Floria Raveling A wrote: CRM for notification. See Telephone encounter for: 12/08/17.  Pt called in and stated that the diclofenac sodium (VOLTAREN) 1 % GEL [[id number]]  is needing a PA and would like to know the status of that PA.  She stated that she is having a rough time with her knee and would like to know what to do?    Best number  [phone]

## 2017-12-10 NOTE — Telephone Encounter (Signed)
Called patient advised prescription for medication has been approved

## 2017-12-10 NOTE — Telephone Encounter (Signed)
Pt called back and is aware Rx has been approved.

## 2018-01-08 ENCOUNTER — Other Ambulatory Visit: Payer: Self-pay | Admitting: Physician Assistant

## 2018-01-08 ENCOUNTER — Ambulatory Visit: Payer: Medicare Other | Admitting: Podiatry

## 2018-01-24 ENCOUNTER — Other Ambulatory Visit: Payer: Self-pay | Admitting: Physician Assistant

## 2018-01-25 NOTE — Telephone Encounter (Signed)
Albuterol HFA Ventolin INH refill request  LOV 04/27/17 with Benny LennertSarah Weber for this issue.  [facility] [name]

## 2018-01-29 ENCOUNTER — Other Ambulatory Visit: Payer: Self-pay | Admitting: Physician Assistant

## 2018-02-02 NOTE — Telephone Encounter (Signed)
Hydrochlorothiazide 25 mg refill request  Per notes needs an appt for refills.   LOV 08/07/17 with Amy PlattStephanie Gallagher.  Last refill:  10/16/17  #30   0 refills  [id number] [place]

## 2018-03-03 ENCOUNTER — Telehealth: Payer: Self-pay | Admitting: Physician Assistant

## 2018-03-03 DIAGNOSIS — G4700 Insomnia, unspecified: Secondary | ICD-10-CM

## 2018-03-03 NOTE — Telephone Encounter (Signed)
Copied from CRM [id number]. Topic: Quick Communication - Rx Refill/Question >> Mar 03, 2018  2:16 PM Crist InfanteHarrald, Kathy J wrote: Medication: ALPRAZolam Prudy Feeler(XANAX) 0.25 MG tablet  Pt has made appt to see [name] 8/29, however pt states she has had 3 deaths in her family in the last month, and needs this med.  [name] is out until next week.  Pt hopes someone can fill for her. Pt states she does not take that often, but sure could use right now. [name] [place], [place] - [address] [phone] (Phone) [phone] (Fax)

## 2018-03-05 ENCOUNTER — Other Ambulatory Visit: Payer: Self-pay | Admitting: Physician Assistant

## 2018-03-05 NOTE — Telephone Encounter (Signed)
pantoprazole refill Last Refill:11/28/17 # 90 Last OV: 08/07/17 PCP: Amy LennertSarah Gallagher [facility] [address] [place]

## 2018-03-08 MED ORDER — ALPRAZOLAM 0.25 MG PO TABS: 0.2500 mg | ORAL_TABLET | Freq: Every day | ORAL | 0 refills | Status: DC

## 2018-03-08 NOTE — Addendum Note (Signed)
Addended by: Morrell RiddleWEBER, Danyal Whitenack L on: 03/08/2018 09:34 AM   Modules accepted: Orders

## 2018-03-11 ENCOUNTER — Other Ambulatory Visit: Payer: Self-pay | Admitting: Physician Assistant

## 2018-03-11 ENCOUNTER — Ambulatory Visit: Payer: Medicare Other | Admitting: Physician Assistant

## 2018-03-11 NOTE — Telephone Encounter (Signed)
Patient has appointment today- please review refill at appointment

## 2018-03-17 ENCOUNTER — Other Ambulatory Visit: Payer: Self-pay

## 2018-03-17 ENCOUNTER — Ambulatory Visit (INDEPENDENT_AMBULATORY_CARE_PROVIDER_SITE_OTHER): Payer: Medicare Other | Admitting: Physician Assistant

## 2018-03-17 ENCOUNTER — Encounter: Payer: Self-pay | Admitting: Physician Assistant

## 2018-03-17 ENCOUNTER — Other Ambulatory Visit: Payer: Self-pay | Admitting: Physician Assistant

## 2018-03-17 VITALS — BP 134/64 | HR 56 | Temp 98.9°F | Resp 18 | Ht 63.0 in

## 2018-03-17 DIAGNOSIS — J45909 Unspecified asthma, uncomplicated: Secondary | ICD-10-CM | POA: Diagnosis not present

## 2018-03-17 DIAGNOSIS — K2100 Gastro-esophageal reflux disease with esophagitis, without bleeding: Secondary | ICD-10-CM

## 2018-03-17 DIAGNOSIS — G4700 Insomnia, unspecified: Secondary | ICD-10-CM

## 2018-03-17 DIAGNOSIS — M109 Gout, unspecified: Secondary | ICD-10-CM

## 2018-03-17 DIAGNOSIS — R12 Heartburn: Secondary | ICD-10-CM | POA: Diagnosis not present

## 2018-03-17 DIAGNOSIS — F439 Reaction to severe stress, unspecified: Secondary | ICD-10-CM

## 2018-03-17 DIAGNOSIS — M159 Polyosteoarthritis, unspecified: Secondary | ICD-10-CM | POA: Diagnosis not present

## 2018-03-17 DIAGNOSIS — Z23 Encounter for immunization: Secondary | ICD-10-CM

## 2018-03-17 DIAGNOSIS — Z7409 Other reduced mobility: Secondary | ICD-10-CM

## 2018-03-17 DIAGNOSIS — R062 Wheezing: Secondary | ICD-10-CM

## 2018-03-17 DIAGNOSIS — M545 Low back pain, unspecified: Secondary | ICD-10-CM

## 2018-03-17 DIAGNOSIS — Z79899 Other long term (current) drug therapy: Secondary | ICD-10-CM | POA: Diagnosis not present

## 2018-03-17 DIAGNOSIS — Z741 Need for assistance with personal care: Secondary | ICD-10-CM | POA: Diagnosis not present

## 2018-03-17 DIAGNOSIS — I10 Essential (primary) hypertension: Secondary | ICD-10-CM

## 2018-03-17 MED ORDER — FLUTICASONE PROPIONATE 50 MCG/ACT NA SUSP: 2.0000 | Freq: Every day | NASAL | 11 refills | Status: DC

## 2018-03-17 MED ORDER — TOILET SAFETY FRAME MISC: 1.0000 | Freq: Once | 0 refills | Status: DC

## 2018-03-17 MED ORDER — PANTOPRAZOLE SODIUM 40 MG PO TBEC: 40.0000 mg | DELAYED_RELEASE_TABLET | Freq: Every day | ORAL | 1 refills | Status: DC

## 2018-03-17 MED ORDER — TRAMADOL HCL 50 MG PO TABS: 50.0000 mg | ORAL_TABLET | ORAL | 0 refills | Status: DC

## 2018-03-17 MED ORDER — DICLOFENAC SODIUM 1 % TD GEL: TRANSDERMAL | 3 refills | Status: DC

## 2018-03-17 MED ORDER — ALLOPURINOL 100 MG PO TABS: 100.0000 mg | ORAL_TABLET | Freq: Every day | ORAL | 1 refills | Status: DC

## 2018-03-17 MED ORDER — ALBUTEROL SULFATE (2.5 MG/3ML) 0.083% IN NEBU: 2.5000 mg | INHALATION_SOLUTION | Freq: Two times a day (BID) | RESPIRATORY_TRACT | 1 refills | Status: DC | PRN

## 2018-03-17 MED ORDER — HYDROCHLOROTHIAZIDE 25 MG PO TABS: 25.0000 mg | ORAL_TABLET | Freq: Every day | ORAL | 1 refills | Status: DC

## 2018-03-17 MED ORDER — BENZONATATE 100 MG PO CAPS: 100.0000 mg | ORAL_CAPSULE | Freq: Three times a day (TID) | ORAL | 1 refills | Status: DC | PRN

## 2018-03-17 MED ORDER — CITALOPRAM HYDROBROMIDE 10 MG PO TABS: 10.0000 mg | ORAL_TABLET | Freq: Every day | ORAL | 1 refills | Status: DC

## 2018-03-17 MED ORDER — ALPRAZOLAM 0.25 MG PO TABS: 0.1250 mg | ORAL_TABLET | Freq: Every day | ORAL | 0 refills | Status: DC

## 2018-03-17 MED ORDER — ALBUTEROL SULFATE HFA 108 (90 BASE) MCG/ACT IN AERS: INHALATION_SPRAY | RESPIRATORY_TRACT | 3 refills | Status: DC

## 2018-03-17 MED ORDER — TOILET SAFETY FRAME MISC: 1.0000 | Freq: Once | 0 refills | Status: AC

## 2018-03-17 MED ORDER — COLCHICINE 0.6 MG PO TABS: 0.6000 mg | ORAL_TABLET | Freq: Every day | ORAL | 0 refills | Status: DC

## 2018-03-17 MED ORDER — FLUTICASONE PROPIONATE HFA 44 MCG/ACT IN AERO: 2.0000 | INHALATION_SPRAY | Freq: Two times a day (BID) | RESPIRATORY_TRACT | 5 refills | Status: DC

## 2018-03-17 NOTE — Progress Notes (Signed)
Amy Gallagher  MRN: [id number] DOB: 1929-08-08  PCP: Morrell Riddle, PA-C  Chief Complaint  Patient presents with  . Medication Refill    medication check     Subjective:  Pt presents to clinic for follow-up of her chronic medical conditions.  No gout flairs. Albuterol every 4 hours - it is allergy season - chest congestion and wheezing with increased cough - it helps when she takes it Knees - hurts all the time from her arthritis - voltaren helps but it does not work to completely control the pain - she stopped the voltaren due to vomiting last week and her pain has gotten worse since then.  She has trouble getting up due to pain.  She does not want knee replacements. Heartburn is controlled on protonix. Anxiety - nerves - recently been worse -- 1/2 in the am, 1/2 evening -worries a lot has friends around her dying.  She is having more trouble at home.  She lives with her son and daughter but she currently has help 2 hours today but she would like to have 4 hours to help with additional items.  She needs help with bathing, meal preparation, house cleaning.  So does minimal moving and could stand to have some physical therapy to help her with generalized strengthening.  History is obtained by patient, son and daughter.  Review of Systems  Constitutional: Negative for chills and fever.  Eyes: Negative for visual disturbance.  Respiratory: Negative for shortness of breath.   Cardiovascular: Negative for chest pain, palpitations and leg swelling.  Musculoskeletal: Positive for arthralgias, back pain (Low back) and joint swelling (Especially her knees bilaterally).  Neurological: Negative for dizziness, light-headedness and headaches.  Psychiatric/Behavioral: Positive for sleep disturbance (Due to worry). The patient is nervous/anxious.     Patient Active Problem List   Diagnosis Date Noted  . Urinary urgency 06/10/2016  . Environmental allergies 08/14/2011  . Gout 09/10/2006    . DEPRESSION, MAJOR, RECURRENT 09/10/2006  . Former smoker 09/10/2006  . HYPERTENSION, BENIGN SYSTEMIC 09/10/2006  . Mild intermittent asthma 09/10/2006  . REFLUX ESOPHAGITIS 09/10/2006  . Arthritis 09/10/2006    Current Outpatient Medications on File Prior to Visit  Medication Sig Dispense Refill  . Misc. Devices [facility]) MISC 1 Device by Does not apply route daily. 1 each 0  . Multiple Vitamin (MULTIVITAMIN WITH MINERALS) TABS tablet Take 1 tablet by mouth daily.    Marland Kitchen trolamine salicylate (ASPERCREME/ALOE) 10 % cream Apply 1 application topically as needed for muscle pain. 85 g 0  . vitamin C (ASCORBIC ACID) 500 MG tablet Take 500 mg by mouth daily.     No current facility-administered medications on file prior to visit.     Allergies  Allergen Reactions  . Aspirin Nausea And Vomiting  . Penicillins Swelling    Past Medical History:  Diagnosis Date  . Arthritis   . Bronchitis   . Gout    Social History   Social History Narrative   Patient cares for her mentally 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    Walks with a rolling walker.    Social History   Tobacco Use  . Smoking status: Former Smoker    Types: Cigarettes    Last attempt to quit: 02/26/1997    Years since quitting: 21.0  . Smokeless tobacco: Never Used  Substance Use Topics  . Alcohol use: No  . Drug use: No   family history  is not on file.     Objective:  BP 134/64   Pulse (!) 56   Temp 98.9 F (37.2 C) (Oral)   Resp 18   Ht 5\' 3"  (1.6 m)   SpO2 99%   BMI 34.01 kg/m  Body mass index is 34.01 kg/m.  Wt Readings from Last 3 Encounters:  04/27/17 192 lb (87.1 kg)  09/24/16 183 lb (83 kg)  06/10/16 192 lb (87.1 kg)    Physical Exam  Constitutional: She is oriented to person, place, and time. She appears well-developed and well-nourished.  HENT:  Head: Normocephalic and atraumatic.  Right Ear: Hearing and external ear normal.  Left Ear: Hearing  and external ear normal.  Eyes: Conjunctivae are normal.  Neck: Normal range of motion.  Cardiovascular: Normal rate, regular rhythm and normal heart sounds.  No murmur heard. Pulmonary/Chest: Effort normal and breath sounds normal.  Musculoskeletal:       Right lower leg: She exhibits no edema.       Left lower leg: She exhibits no edema.  Bilateral knees tends to palpation.  Patient sitting in wheelchair.  At home she uses a Rollator walker.  Neurological: She is alert and oriented to person, place, and time.  Skin: Skin is warm and dry.  Psychiatric: She has a normal mood and affect. Her behavior is normal. Judgment and thought content normal.  Vitals reviewed.   Assessment and Plan :  Gout of right foot, unspecified cause, unspecified chronicity - Plan: Uric Acid, allopurinol (ZYLOPRIM) 100 MG tablet, colchicine 0.6 MG tablet, Ambulatory referral to Home Health -patient is not having acute gout flares currently she is taking her allopurinol daily, will check uric acid today to make sure allopurinol dose is correct.  Lumbar pain - Plan: Ambulatory referral to Home Health -patient continues to have chronic low back pain.  Does have minimal movement.  Will do home health in hopes of physical therapy can help with this pain.  She is used baclofen in the past and it does not really help.  High risk medication use  Osteoarthritis of multiple joints, unspecified osteoarthritis type - Plan: diclofenac sodium (VOLTAREN) 1 % GEL, traMADol (ULTRAM) 50 MG tablet, Ambulatory referral to Home Health, traMADol (ULTRAM) 50 MG tablet, Ambulatory referral to Orthopedic Surgery, Misc. Devices (TOILET SAFETY FRAME) MISC, DISCONTINUED: Misc. Devices (TOILET SAFETY FRAME) MISC -patient has significant osteoarthritis and was told years ago she needed bilateral knee replacements and at that time did not want to do it.  She still is not interested in this but is having significant daily pain.  Her pain is worse in  the morning she has trouble getting up and moving around will use Ultram for pain as well as do a referral for hopeful injections in her knee to decrease her pain.  Flu vaccine need - Plan: Flu vaccine HIGH DOSE PF (Fluzone High dose)  Persistent asthma without complication, unspecified asthma severity - Plan: fluticasone (FLOVENT HFA) 44 MCG/ACT inhaler, benzonatate (TESSALON) 100 MG capsule, fluticasone (FLONASE) 50 MCG/ACT nasal spray, albuterol (PROVENTIL) (2.5 MG/3ML) 0.083% nebulizer solution, albuterol (PROVENTIL HFA;VENTOLIN HFA) 108 (90 Base) MCG/ACT inhaler, Ambulatory referral to Home Health -patient is using too much albuterol we will add Flovent to her regimen encouraged rinsing out mouth afterwards will recheck in 2 months to make sure her albuterol usage is decreased  Essential hypertension - Plan: hydrochlorothiazide (HYDRODIURIL) 25 MG tablet -well-controlled medications  Requires assistance with all daily activities - Plan: Ambulatory referral to Home Health -  needs additional help at home for activities of daily living  Limited mobility - Plan: Ambulatory referral to Home Health, Ambulatory referral to Orthopedic Surgery, Misc. Devices (TOILET SAFETY FRAME) MISC, DISCONTINUED: Misc. Devices (TOILET SAFETY FRAME) MISC  Heartburn - Plan: pantoprazole (PROTONIX) 40 MG tablet -well-controlled on this medication did have a flare with recent naproxen encouraged no longer using NSAIDs  Wheezing - Plan: albuterol (PROVENTIL) (2.5 MG/3ML) 0.083% nebulizer solution, albuterol (PROVENTIL HFA;VENTOLIN HFA) 108 (90 Base) MCG/ACT inhaler-hopefully will improve with improved control of asthma  Reflux esophagitis  Insomnia - Plan: ALPRAZolam (XANAX) 0.25 MG tablet -discussed with patient as well as daughter and son the concern about continuing this medication as her age especially with her instability.  I will give her a small amount  Stress - Plan: citalopram (CELEXA) 10 MG tablet -patient is  having a lot of anxiety and stress she is been using increase in the amount of Xanax and after the discussion that this is not good for her she did agree finally to trying Celexa.  We will recheck this in 2 months.  Patient verbalized to me that they understand the following: diagnosis, what is being done for them, what to expect and what should be done at home.  Their questions have been answered.  See after visit summary for patient specific instructions.  Benny Lennert PA-C  Primary Care at [facility] 03/17/2018 8:36 PM  Please note: Portions of this report may have been transcribed using dragon voice recognition software. Every effort was made to ensure accuracy; however, inadvertent computerized transcription errors may be present.

## 2018-03-17 NOTE — Patient Instructions (Addendum)
  Start Celexa - this will help with the stress of daily life - it is ok to keep using Xanax when things are really bad but this is not something that I want you to use every day.   If you have lab work done today you will be contacted with your lab results within the next 2 weeks.  If you have not heard from [place] then please contact us. The fastest way to get your results is to register for My Chart.   IF you received an x-ray today, you will receive an invoice from [facility]. Please contact [facility] at [phone] with questions or concerns regarding your invoice.   IF you received labwork today, you will receive an invoice from [place]. Please contact [facility] at [phone] with questions or concerns regarding your invoice.   Our billing staff will not be able to assist you with questions regarding bills from these companies.  You will be contacted with the lab results as soon as they are available. The fastest way to get your results is to activate your My Chart account. Instructions are located on the last page of this paperwork. If you have not heard from [place] regarding the results in 2 weeks, please contact this office.

## 2018-03-18 LAB — URIC ACID: URIC ACID: 7.8 mg/dL — AB (ref 2.5–7.1)

## 2018-03-22 ENCOUNTER — Telehealth: Payer: Self-pay | Admitting: Physician Assistant

## 2018-03-22 NOTE — Telephone Encounter (Signed)
Please call patient - I got a message from the home health agency - I think she could really benefit from PT and other home health services - it will not change with her ortho appt - it is always good to work on balance and generalized strength.

## 2018-03-23 DIAGNOSIS — M545 Low back pain: Secondary | ICD-10-CM | POA: Diagnosis not present

## 2018-03-23 DIAGNOSIS — M109 Gout, unspecified: Secondary | ICD-10-CM | POA: Diagnosis not present

## 2018-03-23 DIAGNOSIS — Z741 Need for assistance with personal care: Secondary | ICD-10-CM | POA: Diagnosis not present

## 2018-03-23 DIAGNOSIS — Z7409 Other reduced mobility: Secondary | ICD-10-CM | POA: Diagnosis not present

## 2018-03-23 DIAGNOSIS — K210 Gastro-esophageal reflux disease with esophagitis: Secondary | ICD-10-CM | POA: Diagnosis not present

## 2018-03-23 DIAGNOSIS — M159 Polyosteoarthritis, unspecified: Secondary | ICD-10-CM | POA: Diagnosis not present

## 2018-03-23 DIAGNOSIS — R12 Heartburn: Secondary | ICD-10-CM | POA: Diagnosis not present

## 2018-03-23 DIAGNOSIS — J45909 Unspecified asthma, uncomplicated: Secondary | ICD-10-CM | POA: Diagnosis not present

## 2018-03-23 DIAGNOSIS — R062 Wheezing: Secondary | ICD-10-CM | POA: Diagnosis not present

## 2018-03-23 DIAGNOSIS — Z23 Encounter for immunization: Secondary | ICD-10-CM | POA: Diagnosis not present

## 2018-03-23 DIAGNOSIS — I10 Essential (primary) hypertension: Secondary | ICD-10-CM | POA: Diagnosis not present

## 2018-03-23 DIAGNOSIS — Z79899 Other long term (current) drug therapy: Secondary | ICD-10-CM | POA: Diagnosis not present

## 2018-03-23 NOTE — Telephone Encounter (Signed)
Patient would like for [place] to call her back.

## 2018-03-23 NOTE — Telephone Encounter (Signed)
Copied from CRM [id number]. Topic: General - Other >> Mar 23, 2018  9:11 AM Maia Petties wrote: Pt states she missed a call. Please call back about toilet seat/commode from [facility]. Ph # [phone]

## 2018-03-23 NOTE — Telephone Encounter (Signed)
Called pt and advised her of providers recommendations.

## 2018-03-24 NOTE — Telephone Encounter (Signed)
Please see note below for replacement toilet chair

## 2018-03-24 NOTE — Telephone Encounter (Signed)
Patient called to follow up on orders for her replacement toilet chair. She stated that she had one before but it got old and broke apart so now she need a new one. Patient would like orders sent to [facility] ASAP please. CB Ph # [phone]

## 2018-03-28 NOTE — Telephone Encounter (Signed)
At her last visit I think I gave her one of these.  Since this has to be signed I am going to have to wait until I come into the office to do this.

## 2018-04-01 MED ORDER — 3-IN-1 BEDSIDE TOILET MISC: 1.0000 | Freq: Once | 0 refills | Status: AC

## 2018-04-01 MED ORDER — TOILET SAFETY FRAME MISC: 1.0000 | Freq: Once | 1 refills | Status: AC

## 2018-04-01 NOTE — Telephone Encounter (Signed)
I have done 2 please fax both -- she can choose which one she wants

## 2018-04-05 ENCOUNTER — Other Ambulatory Visit: Payer: Self-pay | Admitting: Physician Assistant

## 2018-04-06 NOTE — Telephone Encounter (Signed)
rx faxed

## 2018-04-08 ENCOUNTER — Other Ambulatory Visit: Payer: Self-pay | Admitting: Physician Assistant

## 2018-04-08 DIAGNOSIS — F439 Reaction to severe stress, unspecified: Secondary | ICD-10-CM

## 2018-04-08 NOTE — Telephone Encounter (Signed)
Refill of celexa rx'd by S. Weber  LOV 03/17/18 S. Weber  [facility] 03/17/18  #30  1 refill  Pharmacy comment: REQUEST FOR 90 DAYS PRESCRIPTION. DX Code Needed.       [id number] [place], [place] - [address]

## 2018-04-12 ENCOUNTER — Ambulatory Visit (INDEPENDENT_AMBULATORY_CARE_PROVIDER_SITE_OTHER): Payer: Medicare Other | Admitting: Physician Assistant

## 2018-05-18 IMAGING — DX DG LUMBAR SPINE COMPLETE 4+V
5 series · 5 of 5 positions shown · non-contrast
Comparison: None.

CLINICAL DATA: Left low back pain and tenderness.

EXAM:
LUMBAR SPINE - COMPLETE 4+ VIEW

[l-spine ap]
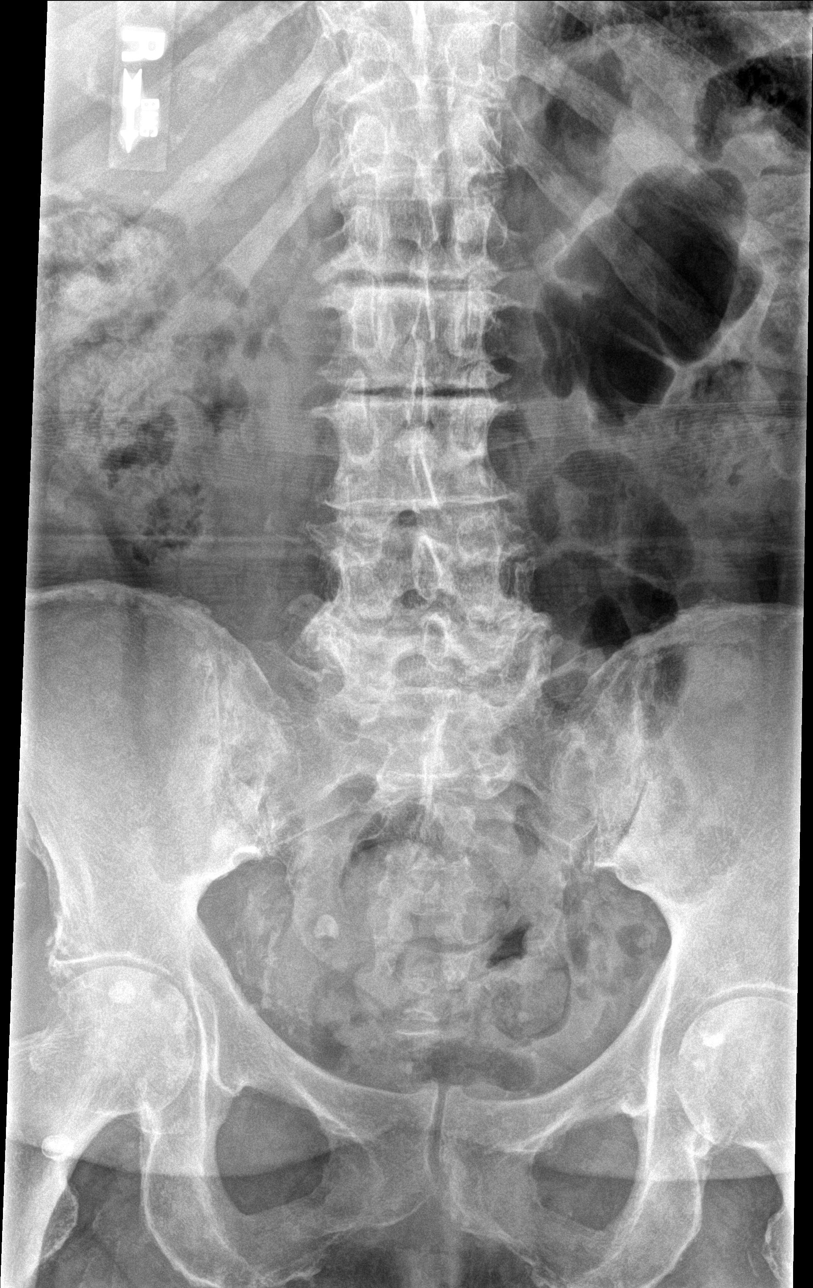

[l-spine obl (1 of 2)]
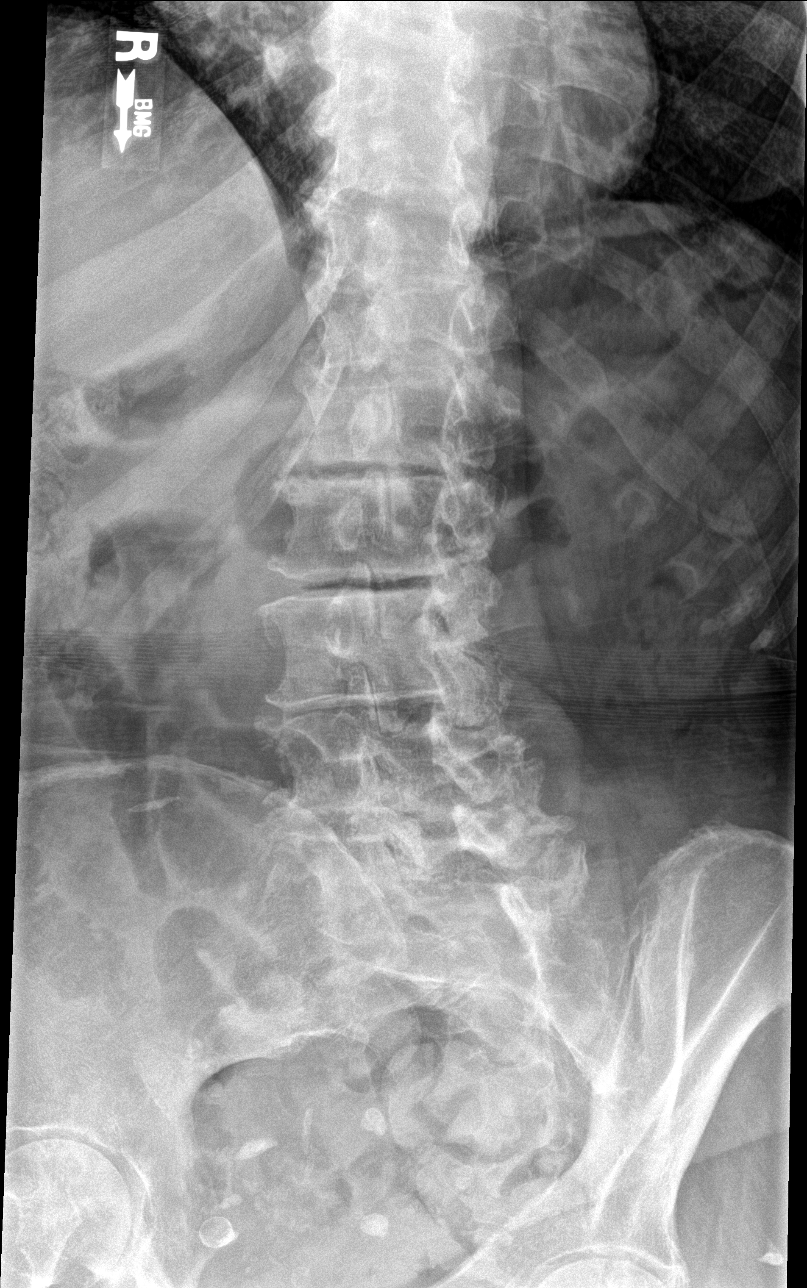

[l-spine obl (2 of 2)]
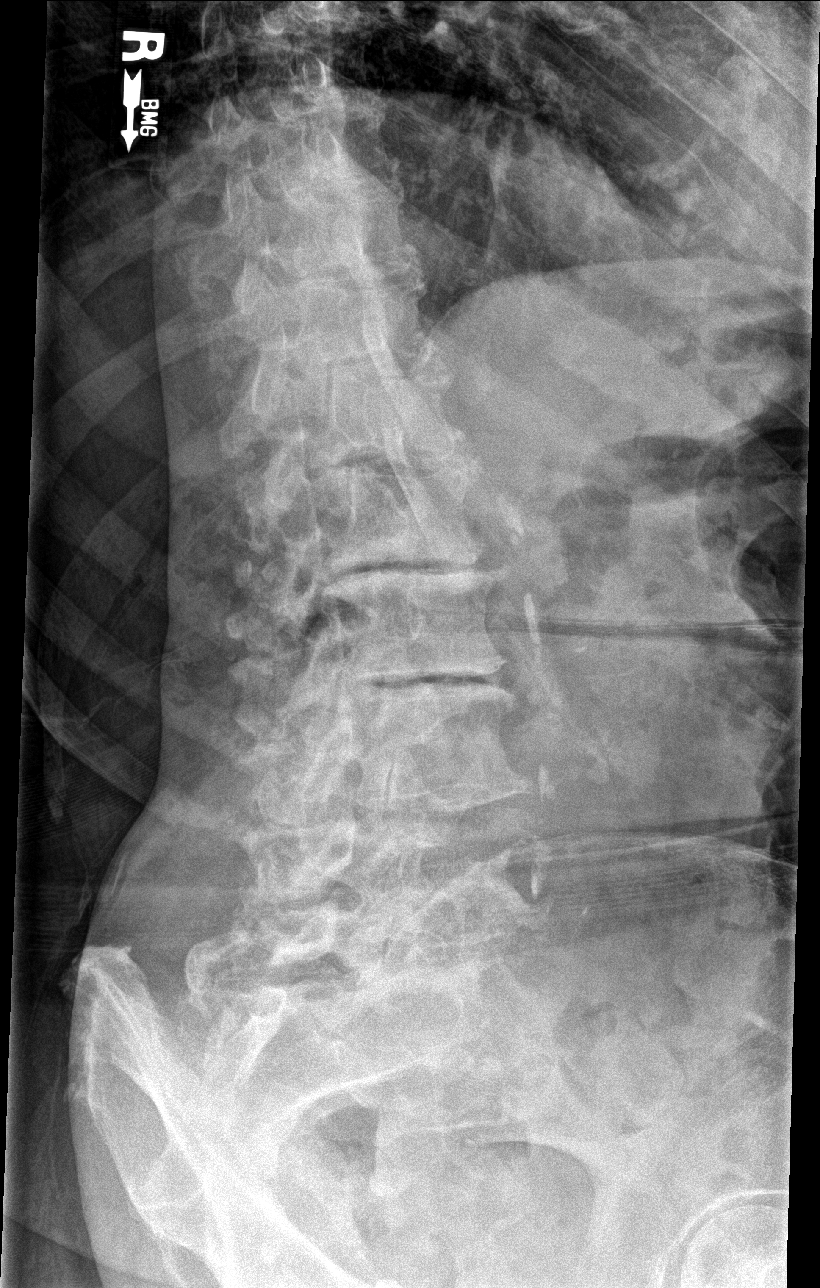

[l-spine lat]
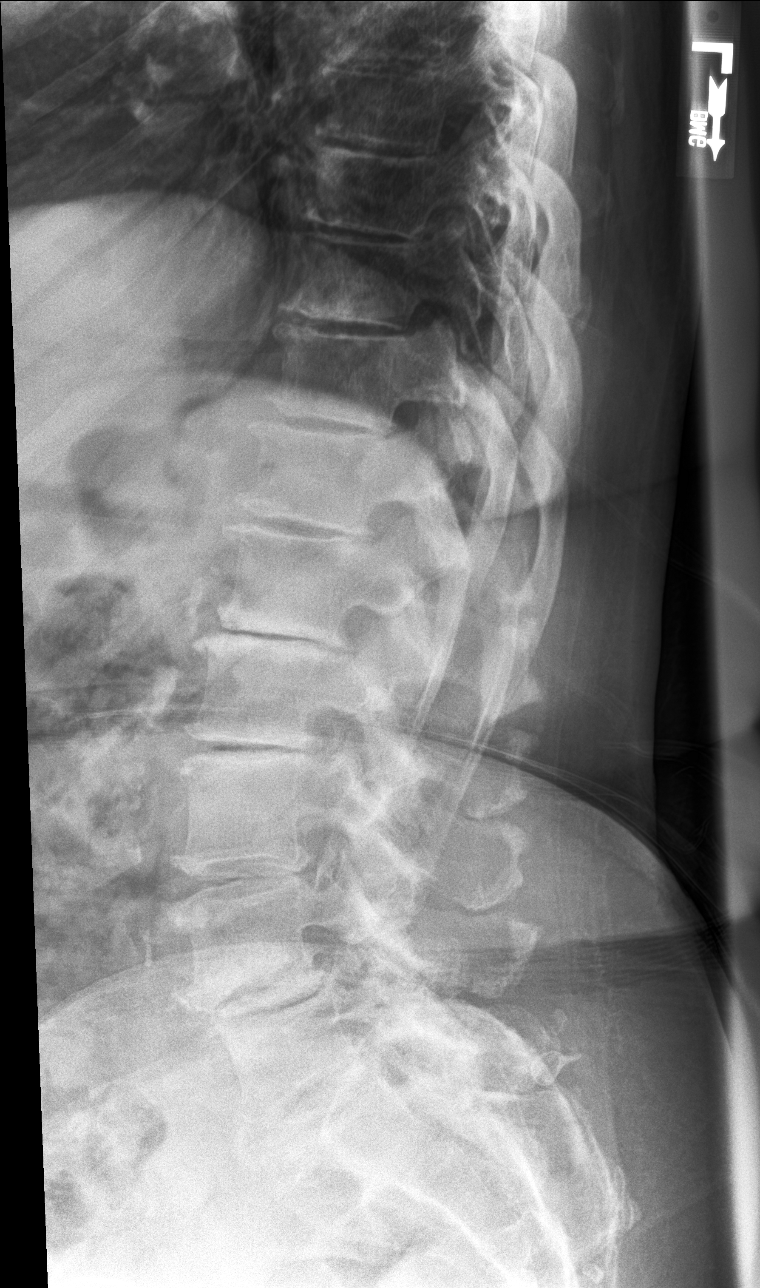

[l-spine l5-s1]
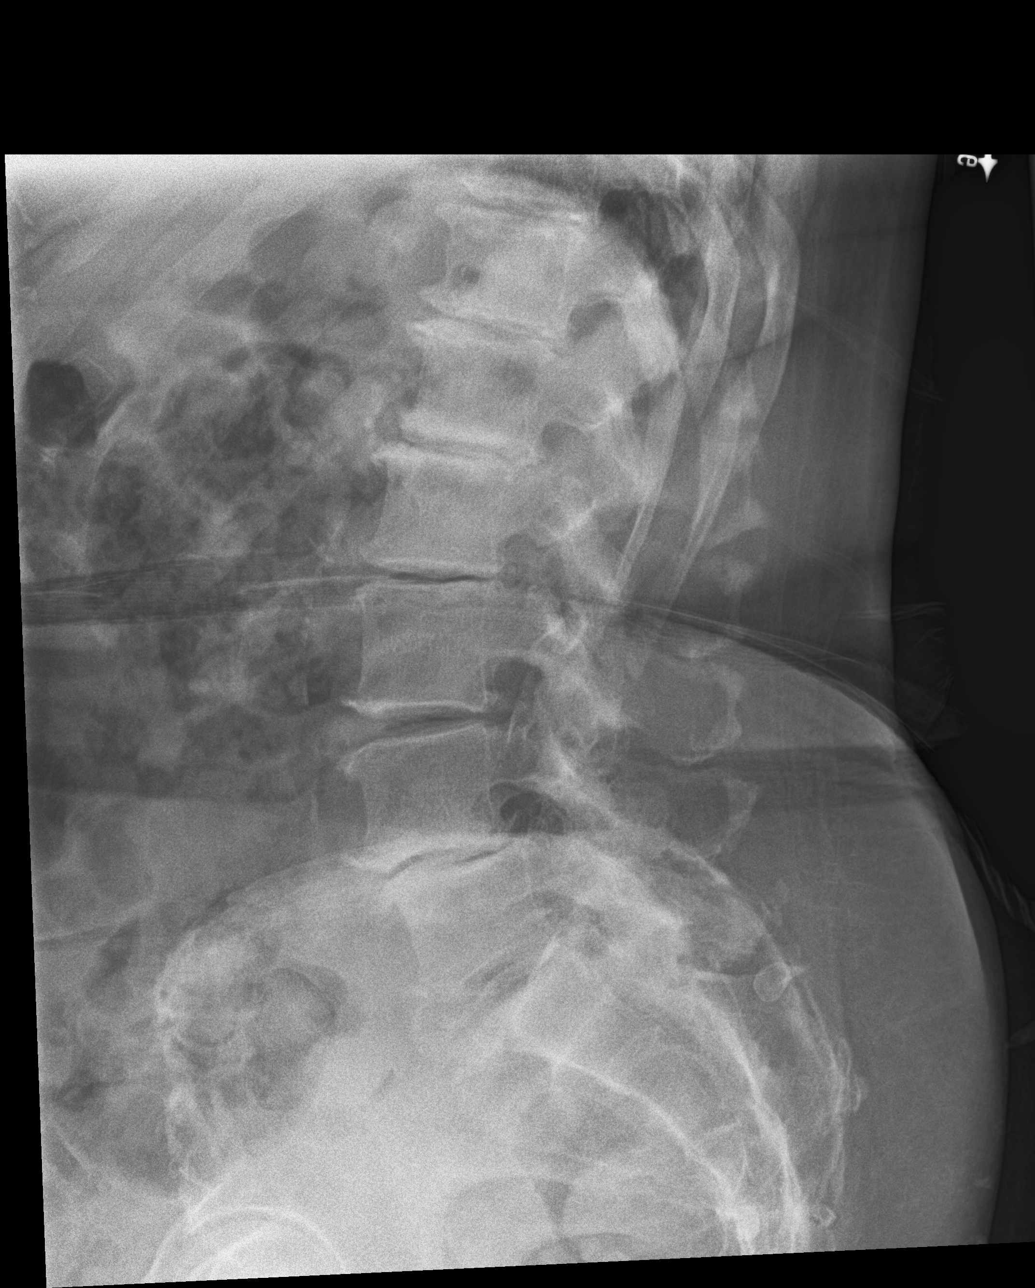

[5 of 5 positions shown; findings below may reference images not displayed]

FINDINGS: 5 mm anterolisthesis of L4 on L5 noted. There is no evidence of
acute fracture.

Moderate to severe multilevel degenerative disc disease noted
throughout the lumbar spine.

Mild to moderate multilevel facet arthropathy noted.

No suspicious focal bony lesions or spondylolysis noted.
IMPRESSION: Moderate to severe multilevel degenerative changes with grade 1
anterolisthesis of L4 on 5.

## 2018-06-01 ENCOUNTER — Ambulatory Visit: Payer: Medicare Other | Admitting: Family Medicine

## 2018-06-01 ENCOUNTER — Ambulatory Visit: Payer: Medicare Other | Admitting: Physician Assistant

## 2018-06-03 ENCOUNTER — Telehealth: Payer: Self-pay | Admitting: Family Medicine

## 2018-06-03 NOTE — Telephone Encounter (Signed)
Copied from CRM [id number]. Topic: General - Inquiry >> Jun 03, 2018 11:17 AM Lynne LoganHudson, Caryn D wrote: Reason for CRM: Melanie with [employer] called to inquire about recertification Paperwork for incontinence supplies for pt. First fax sent overon 04/29/18 Second fax sent over on 05/26/18. Please advise Cb# [phone]. Fax number to return forms is on paperwork

## 2018-06-06 NOTE — Telephone Encounter (Signed)
Please pull this form from my bin so that I can address this in the office tomorrow.  Dr. Creta LevinStallings

## 2018-06-07 ENCOUNTER — Telehealth: Payer: Self-pay | Admitting: Family Medicine

## 2018-06-07 NOTE — Telephone Encounter (Signed)
Form signed and sent back to [employer]-confirmation email received. Dgaddy, CMA

## 2018-06-07 NOTE — Telephone Encounter (Signed)
Received fax from [employer] in regards to incontinence supplies.  Medical supply company needs more information to justify the need for supplies and insurance coverage.  I have sent the last two OV notes pt had with Weber which talk about urinary frequency and needing help with ADLs (like using the bathroom); however, there is nothing specifically stating that she needs incontinence supplies.  When she comes in to see you on 12/16, could you ask the pt about this?  Thank you!!

## 2018-06-09 ENCOUNTER — Telehealth: Payer: Self-pay | Admitting: Family Medicine

## 2018-06-09 NOTE — Telephone Encounter (Signed)
LVM for pt to call the office and reschedule their appt with Dr. Creta LevinStallings on 06/28/18. Due to provider schedule change, Dr. Creta LevinStallings will not be in the office this day. When pt calls back, please reschedule with Dr. Creta LevinStallings at their convenience. Please offer 06/30/18 as we have them on hold for pts of Stallings that are having to be rescheduled. Thank you!

## 2018-06-28 ENCOUNTER — Ambulatory Visit: Payer: Medicare Other | Admitting: Family Medicine

## 2018-11-24 ENCOUNTER — Telehealth: Payer: Self-pay | Admitting: Family Medicine

## 2018-11-24 ENCOUNTER — Ambulatory Visit: Payer: Medicare Other | Admitting: Registered Nurse

## 2018-11-24 NOTE — Telephone Encounter (Signed)
NEEDS AN ALBUTEROL REFILL

## 2018-11-29 ENCOUNTER — Telehealth: Payer: Self-pay | Admitting: Registered Nurse

## 2018-11-29 NOTE — Telephone Encounter (Signed)
Need office visit for further refills. 

## 2018-11-29 NOTE — Telephone Encounter (Signed)
Copied from CRM [id number]. Topic: Quick Communication - Rx Refill/Question >> Nov 29, 2018  2:43 PM Percival Spanish wrote: Medication     albuterol (PROVENTIL HFA;VENTOLIN HFA) 108 (90 Base) MCG/ACT inhaler   albuterol (PROVENTIL) (2.5 MG/3ML) 0.083% nebulizer solution   [facility]  [facility] [address]   Agent: Please be advised that RX refills may take up to 3 business days. We ask that you follow-up with your pharmacy.

## 2018-12-01 ENCOUNTER — Ambulatory Visit: Payer: Medicare Other | Admitting: Registered Nurse

## 2018-12-02 ENCOUNTER — Other Ambulatory Visit: Payer: Self-pay

## 2018-12-02 ENCOUNTER — Telehealth: Payer: Self-pay

## 2018-12-02 DIAGNOSIS — J45909 Unspecified asthma, uncomplicated: Secondary | ICD-10-CM

## 2018-12-02 DIAGNOSIS — R062 Wheezing: Secondary | ICD-10-CM

## 2018-12-02 MED ORDER — ALBUTEROL SULFATE HFA 108 (90 BASE) MCG/ACT IN AERS: INHALATION_SPRAY | RESPIRATORY_TRACT | 1 refills | Status: DC

## 2018-12-02 MED ORDER — ALBUTEROL SULFATE (2.5 MG/3ML) 0.083% IN NEBU: 2.5000 mg | INHALATION_SOLUTION | Freq: Two times a day (BID) | RESPIRATORY_TRACT | 1 refills | Status: DC | PRN

## 2018-12-02 NOTE — Telephone Encounter (Signed)
Pt called office requesting refill on albuterol inhaler and neb solution albuterol. Returned pt call to verify medication and pharmacy.  Meds and pharmacy verified- albuterol inhaler #1 with 1 refill and albuterol 0.083% (2.5 mg/3 mL) neb solution #1 with 1 refill approved and [facility] [address].  Pt notified and appreciative, advised will bring her in for in office visit after covid-19 slows down.  Pt agreeable.

## 2019-02-03 ENCOUNTER — Other Ambulatory Visit: Payer: Self-pay

## 2019-02-03 DIAGNOSIS — R062 Wheezing: Secondary | ICD-10-CM

## 2019-02-03 DIAGNOSIS — J45909 Unspecified asthma, uncomplicated: Secondary | ICD-10-CM

## 2019-02-03 MED ORDER — ALBUTEROL SULFATE (2.5 MG/3ML) 0.083% IN NEBU: 2.5000 mg | INHALATION_SOLUTION | Freq: Two times a day (BID) | RESPIRATORY_TRACT | 1 refills | Status: DC | PRN

## 2019-02-15 ENCOUNTER — Other Ambulatory Visit: Payer: Self-pay | Admitting: Family Medicine

## 2019-02-15 DIAGNOSIS — J45909 Unspecified asthma, uncomplicated: Secondary | ICD-10-CM

## 2019-02-15 DIAGNOSIS — R062 Wheezing: Secondary | ICD-10-CM

## 2019-02-15 NOTE — Telephone Encounter (Signed)
Requested medications are due for refill today?  Yes  Requested medications are on the active medication list?  Yes  Last refill 12/02/2018  Future visit scheduled?  No  Notes to clinic  Patient more than 3 months over due for follow up.  Called patient and asked her to call [place] to schedule appointment.  Patient agreeable.   Requested Prescriptions  Pending Prescriptions Disp Refills   VENTOLIN HFA 108 (90 Base) MCG/ACT inhaler [Pharmacy Med Name: VENTOLIN HFA 90 MCG INHALER]  1    Sig: INHALE 2 PUFFS INTO THE LUNGS EVERY 4 HOURS AS NEEDED FOR WHEEZING OR SHORTNESS OF BREATH NOV     Pulmonology:  Beta Agonists Failed - 02/15/2019  1:43 AM      Failed - One inhaler should last at least one month. If the patient is requesting refills earlier, contact the patient to check for uncontrolled symptoms.      Passed - Valid encounter within last 12 months    Recent Outpatient Visits          11 months ago Gout of right foot, unspecified cause, unspecified chronicity   [facility] at [place], PA-C   1 year ago Anterolisthesis   [facility] at [place], [place], [place]   1 year ago Frequency of urination   [facility] at [place], PA-C   2 years ago Arthritis   [facility] at [facility], [place]ene Mink, PA-C   2 years ago Urinary urgency   Primary Care at [place], PA-C

## 2019-03-02 ENCOUNTER — Telehealth: Payer: Self-pay | Admitting: Family Medicine

## 2019-03-02 NOTE — Telephone Encounter (Signed)
Patient calling back to check status of this request. Patient requesting a call back as soon as possible.

## 2019-03-02 NOTE — Telephone Encounter (Signed)
Pt scheduled appt with Orland Mustard for 8/21

## 2019-03-02 NOTE — Telephone Encounter (Signed)
Pt called in requesting pantoprazole  Refill. Pt states she is in pain

## 2019-03-03 ENCOUNTER — Other Ambulatory Visit: Payer: Self-pay

## 2019-03-03 DIAGNOSIS — R12 Heartburn: Secondary | ICD-10-CM

## 2019-03-03 MED ORDER — PANTOPRAZOLE SODIUM 40 MG PO TBEC: 40.0000 mg | DELAYED_RELEASE_TABLET | Freq: Every day | ORAL | 0 refills | Status: DC

## 2019-03-03 NOTE — Telephone Encounter (Signed)
Rx sent to pharmacy   

## 2019-03-04 ENCOUNTER — Telehealth (INDEPENDENT_AMBULATORY_CARE_PROVIDER_SITE_OTHER): Payer: Medicare Other | Admitting: Registered Nurse

## 2019-03-04 ENCOUNTER — Encounter: Payer: Self-pay | Admitting: Registered Nurse

## 2019-03-04 ENCOUNTER — Other Ambulatory Visit: Payer: Self-pay

## 2019-03-04 VITALS — Wt 191.0 lb

## 2019-03-04 DIAGNOSIS — F439 Reaction to severe stress, unspecified: Secondary | ICD-10-CM

## 2019-03-04 DIAGNOSIS — E54 Ascorbic acid deficiency: Secondary | ICD-10-CM | POA: Diagnosis not present

## 2019-03-04 DIAGNOSIS — I10 Essential (primary) hypertension: Secondary | ICD-10-CM

## 2019-03-04 DIAGNOSIS — M159 Polyosteoarthritis, unspecified: Secondary | ICD-10-CM

## 2019-03-04 DIAGNOSIS — R12 Heartburn: Secondary | ICD-10-CM

## 2019-03-04 DIAGNOSIS — G4700 Insomnia, unspecified: Secondary | ICD-10-CM

## 2019-03-04 MED ORDER — CITALOPRAM HYDROBROMIDE 10 MG PO TABS: 10.0000 mg | ORAL_TABLET | Freq: Every day | ORAL | 1 refills | Status: DC

## 2019-03-04 MED ORDER — TRAMADOL HCL 50 MG PO TABS: 50.0000 mg | ORAL_TABLET | ORAL | 0 refills | Status: DC

## 2019-03-04 MED ORDER — DEXTROMETHORPHAN HBR 10 MG/15ML PO SYRP: 15.0000 mL | ORAL_SOLUTION | Freq: Every day | ORAL | 3 refills | Status: DC

## 2019-03-04 MED ORDER — ALPRAZOLAM 0.25 MG PO TABS: 0.1250 mg | ORAL_TABLET | Freq: Every day | ORAL | 0 refills | Status: DC

## 2019-03-04 MED ORDER — CELECOXIB 50 MG PO CAPS: 50.0000 mg | ORAL_CAPSULE | Freq: Two times a day (BID) | ORAL | 0 refills | Status: DC

## 2019-03-04 MED ORDER — HYDROCHLOROTHIAZIDE 25 MG PO TABS: 25.0000 mg | ORAL_TABLET | Freq: Every day | ORAL | 1 refills | Status: DC

## 2019-03-04 MED ORDER — VITAMIN C 500 MG PO TABS: 500.0000 mg | ORAL_TABLET | Freq: Every day | ORAL | 3 refills | Status: DC

## 2019-03-04 MED ORDER — PANTOPRAZOLE SODIUM 40 MG PO TBEC: 40.0000 mg | DELAYED_RELEASE_TABLET | Freq: Every day | ORAL | 0 refills | Status: DC

## 2019-03-04 NOTE — Progress Notes (Signed)
Called patient to triage for appointment. Patient state she needs medication refill on Celexa, Hctz, Protonix. Patient states she was moving and someone stole her nebulizer machine, she will need another one. Patient wants Vitamin C prescribed so Medicare will pay. Patient is complaining of knee pain because of arthritis and need something to help with the pain. Patient will discuss she needs something other than Tessalon capsules, she want a liquid medication.

## 2019-03-04 NOTE — Progress Notes (Signed)
Telemedicine Encounter- SOAP NOTE Established Patient  This telephone encounter was conducted with the patient's (or proxy's) verbal consent via audio telecommunications: yes   Patient was instructed to have this encounter in a suitably private space; and to only have persons present to whom they give permission to participate. In addition, patient identity was confirmed by use of name plus two identifiers (DOB and address).  I discussed the limitations, risks, security and privacy concerns of performing an evaluation and management service by telephone and the availability of in person appointments. I also discussed with the patient that there may be a patient responsible charge related to this service. The patient expressed understanding and agreed to proceed.  I spent a total of 18 minutes talking with the patient or their proxy.  No chief complaint on file.   Subjective   Amy Gallagher is a 85 y.o. established patient. Telephone visit today for medication refills  Needs Celexa, HCTZ, and Protonix refilled - no complaints on these medications. Reports BP has been good, no reflux symptoms. Has reported some lability in mood, but this has been related to staying home x 7 months+. Overall feeling okay.  In addition, states she has ongoing knee pain from her OA. She laments that she is too old for an operation, and that in the past tramadol, voltaren, and celebrex have not worked for her. We suggested using an alternating schedule of all three of these medications. She is agreeable to plan.  Additionally, requests cough syrup, as she has trouble digesting the capsules with tessalon pearls, and has had her nebulizer machine misplaced or stolen. She has chronic bronchitis.  Also requests Vit C rx so her ins can cover this.  HPI   Patient Active Problem List   Diagnosis Date Noted  . Urinary urgency 06/10/2016  . Environmental allergies 08/14/2011  . Gout 09/10/2006  . DEPRESSION,  MAJOR, RECURRENT 09/10/2006  . Former smoker 09/10/2006  . HYPERTENSION, BENIGN SYSTEMIC 09/10/2006  . Mild intermittent asthma 09/10/2006  . REFLUX ESOPHAGITIS 09/10/2006  . Arthritis 09/10/2006    Past Medical History:  Diagnosis Date  . Arthritis   . Bronchitis   . Gout     Current Outpatient Medications  Medication Sig Dispense Refill  . albuterol (PROVENTIL) (2.5 MG/3ML) 0.083% nebulizer solution Take 3 mLs (2.5 mg total) by nebulization 2 (two) times daily as needed for wheezing or shortness of breath. 150 mL 1  . albuterol (VENTOLIN HFA) 108 (90 Base) MCG/ACT inhaler INHALE 2 PUFFS INTO THE LUNGS EVERY 4 (FOUR) HOURS AS NEEDED FOR WHEEZING OR SHORTNESS OF BREATH. 18 Inhaler 1  . allopurinol (ZYLOPRIM) 100 MG tablet Take 1 tablet (100 mg total) by mouth daily. 90 tablet 1  . ALPRAZolam (XANAX) 0.25 MG tablet Take 0.5 tablets (0.125 mg total) by mouth daily. 15 tablet 0  . citalopram (CELEXA) 10 MG tablet Take 1 tablet (10 mg total) by mouth daily. 30 tablet 1  . colchicine 0.6 MG tablet Take 1 tablet (0.6 mg total) by mouth daily. 30 tablet 0  . diclofenac sodium (VOLTAREN) 1 % GEL APPLY 2-4GMS 2-3 TIMES DAILY AS NEEDED 400 g 3  . fluticasone (FLONASE) 50 MCG/ACT nasal spray Place 2 sprays into both nostrils daily. 16 g 11  . fluticasone (FLOVENT HFA) 44 MCG/ACT inhaler Inhale 2 puffs into the lungs 2 (two) times daily. 1 Inhaler 5  . hydrochlorothiazide (HYDRODIURIL) 25 MG tablet Take 1 tablet (25 mg total) by mouth daily. 90 tablet  1  . Multiple Vitamin (MULTIVITAMIN WITH MINERALS) TABS tablet Take 1 tablet by mouth daily.    . pantoprazole (PROTONIX) 40 MG tablet Take 1 tablet (40 mg total) by mouth daily. 90 tablet 0  . trolamine salicylate (ASPERCREME/ALOE) 10 % cream Apply 1 application topically as needed for muscle pain. 85 g 0  . vitamin C (ASCORBIC ACID) 500 MG tablet Take 500 mg by mouth daily.    . benzonatate (TESSALON) 100 MG capsule Take 1-2 capsules (100-200 mg  total) by mouth 3 (three) times daily as needed for cough. (Patient not taking: Reported on 03/04/2019) 40 capsule 1  . celecoxib (CELEBREX) 50 MG capsule Take 1 capsule (50 mg total) by mouth 2 (two) times daily. 30 capsule 0  . Dextromethorphan HBr 10 MG/15ML SYRP Take 15 mLs (10 mg total) by mouth daily. 240 mL 3  . Misc. Devices [facility](WHEELCHAIR) MISC 1 Device by Does not apply route daily. 1 each 0  . traMADol (ULTRAM) 50 MG tablet Take 1 tablet (50 mg total) by mouth every morning. TAKE 1 TABLET BY MOUTH EVERY 12 HOURS AS NEEDED 30 tablet 0   No current facility-administered medications for this visit.     Allergies  Allergen Reactions  . Aspirin Nausea And Vomiting  . Penicillins Swelling    Social History   Socioeconomic History  . Marital status: Widowed    Spouse name: Not on file  . Number of children: Not on file  . Years of education: Not on file  . Highest education level: Not on file  Occupational History  . Not on file  Social Needs  . Financial resource strain: Not on file  . Food insecurity    Worry: Not on file    Inability: Not on file  . Transportation needs    Medical: Not on file    Non-medical: Not on file  Tobacco Use  . Smoking status: Former Smoker    Types: Cigarettes    Quit date: 02/26/1997    Years since quitting: 22.0  . Smokeless tobacco: Never Used  Substance and Sexual Activity  . Alcohol use: No  . Drug use: No  . Sexual activity: Not on file  Lifestyle  . Physical activity    Days per week: Not on file    Minutes per session: Not on file  . Stress: Not on file  Relationships  . Social Musicianconnections    Talks on phone: Not on file    Gets together: Not on file    Attends religious service: Not on file    Active member of club or organization: Not on file    Attends meetings of clubs or organizations: Not on file    Relationship status: Not on file  . Intimate partner violence    Fear of current or ex partner: Not on file     Emotionally abused: Not on file    Physically abused: Not on file    Forced sexual activity: Not on file  Other Topics Concern  . Not on file  Social History Narrative   Patient cares for her mentally 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    Walks with a rolling walker.     ROS Per hpi  Objective   Vitals as reported by the patient: Today's Vitals   03/04/19 0817  Weight: 191 lb (86.6 kg)    Diagnoses and all orders for this visit:  Stress -  citalopram (CELEXA) 10 MG tablet; Take 1 tablet (10 mg total) by mouth daily.  Essential hypertension -     hydrochlorothiazide (HYDRODIURIL) 25 MG tablet; Take 1 tablet (25 mg total) by mouth daily.  Heartburn -     pantoprazole (PROTONIX) 40 MG tablet; Take 1 tablet (40 mg total) by mouth daily.  Insomnia -     ALPRAZolam (XANAX) 0.25 MG tablet; Take 0.5 tablets (0.125 mg total) by mouth daily.  Osteoarthritis of multiple joints, unspecified osteoarthritis type -     traMADol (ULTRAM) 50 MG tablet; Take 1 tablet (50 mg total) by mouth every morning. TAKE 1 TABLET BY MOUTH EVERY 12 HOURS AS NEEDED -     celecoxib (CELEBREX) 50 MG capsule; Take 1 capsule (50 mg total) by mouth 2 (two) times daily.  Other orders -     Dextromethorphan HBr 10 MG/15ML SYRP; Take 15 mLs (10 mg total) by mouth daily.   PLAN  Medications refilled  Discussed following up with PCP Dr. Creta LevinStallings in 6-8 weeks  Patient encouraged to call clinic with any questions, comments, or concerns.    I discussed the assessment and treatment plan with the patient. The patient was provided an opportunity to ask questions and all were answered. The patient agreed with the plan and demonstrated an understanding of the instructions.   The patient was advised to call back or seek an in-person evaluation if the symptoms worsen or if the condition fails to improve as anticipated.  I provided 18 minutes of non-face-to-face time  during this encounter.  Janeece Ageeichard Garlan Drewes, NP  Primary Care at [facility]

## 2019-03-16 ENCOUNTER — Other Ambulatory Visit: Payer: Self-pay | Admitting: Registered Nurse

## 2019-03-16 DIAGNOSIS — M159 Polyosteoarthritis, unspecified: Secondary | ICD-10-CM

## 2019-03-22 ENCOUNTER — Other Ambulatory Visit: Payer: Self-pay | Admitting: Registered Nurse

## 2019-03-22 NOTE — Telephone Encounter (Signed)
Pt would like a refill on her Dextromethorphan HBr 10 MG/15ML SYRP [[id number]]  Pharmacy:  [id number] - [place], [place] [address]  DEA #:  [id number]  Her throat is still bothering her. Please advise at [phone]

## 2019-03-24 NOTE — Telephone Encounter (Signed)
Pt called pharmacy, they do not have meds in stock. Pt is requesting Rx to be routed to [address] [facility]

## 2019-03-24 NOTE — Telephone Encounter (Signed)
Pt has refills and was advised to call pharmacy

## 2019-03-24 NOTE — Telephone Encounter (Signed)
Pt called to request refill, requesting phone call when Rx is sent. Pt needs to schedule transportation to retrieve refill Best contact: [phone]

## 2019-03-24 NOTE — Telephone Encounter (Signed)
Requested medication (s) are due for refill today: yes  Requested medication (s) are on the active medication list: yes  Last refill:  03/04/2019  Future visit scheduled: no  Notes to clinic:  Please contact patient once the medication has been sent as they have to arrange transportation    Requested Prescriptions  Pending Prescriptions Disp Refills   Dextromethorphan HBr 10 MG/15ML SYRP 240 mL 3    Sig: Take 15 mLs (10 mg total) by mouth daily.     There is no refill protocol information for this order

## 2019-03-25 NOTE — Telephone Encounter (Signed)
Patient is calling to check on the status of her medication refill. To be sent to [place].  Dextromethorphan HBR 10MG /15ML Syrp

## 2019-03-25 NOTE — Telephone Encounter (Signed)
Requested medication (s) are due for refill today: yes  Requested medication (s) are on the active medication list: yes  Last refill: 03/04/2019  Future visit scheduled: no  Notes to clinic:  2 request for medication    Requested Prescriptions  Pending Prescriptions Disp Refills   Dextromethorphan HBr 10 MG/15ML SYRP 240 mL 3    Sig: Take 15 mLs (10 mg total) by mouth daily.     There is no refill protocol information for this order

## 2019-03-25 NOTE — Telephone Encounter (Signed)
Dr Nolon Rod advise please.

## 2019-03-26 ENCOUNTER — Other Ambulatory Visit: Payer: Self-pay | Admitting: Registered Nurse

## 2019-03-26 DIAGNOSIS — F439 Reaction to severe stress, unspecified: Secondary | ICD-10-CM

## 2019-03-27 NOTE — Telephone Encounter (Signed)
Requested Prescriptions  Pending Prescriptions Disp Refills  . citalopram (CELEXA) 10 MG tablet [Pharmacy Med Name: CITALOPRAM HBR 10 MG TABLET] 30 tablet 0    Sig: TAKE 1 TABLET BY MOUTH EVERY DAY     Psychiatry:  Antidepressants - SSRI Passed - 03/26/2019  2:34 PM      Passed - Valid encounter within last 6 months    Recent Outpatient Visits          3 weeks ago Vitamin C deficiency   [facility], [place], NP   1 year ago Gout of right foot, unspecified cause, unspecified chronicity   Primary Care at [place], PA-C   1 year ago Anterolisthesis   Primary Care at [place], [place], [place]   1 year ago Frequency of urination   Primary Care at [place], PA-C   2 years ago Arthritis   Primary Care at [facility], [place], PA-C             Passed - Completed PHQ-2 or PHQ-9 in the last 360 days.      pt. seen on 03/04/19; advised to f/u with PCP 6-8 weeks; #30 given as courtesy refill.

## 2019-03-28 NOTE — Telephone Encounter (Signed)
I do not see an appointment recently addressing cough or prescribing this medication. Please let the patient know that she would need to be seen for cough.

## 2019-03-29 NOTE — Telephone Encounter (Signed)
Spoke with pt an advised she will need appt to be seen before med can be filled.  Scheduled telemed for 04/11/2019 at 4:40 pm and pt agreeable. Dgaddy, CMA

## 2019-03-31 ENCOUNTER — Other Ambulatory Visit: Payer: Self-pay | Admitting: Family Medicine

## 2019-03-31 DIAGNOSIS — R062 Wheezing: Secondary | ICD-10-CM

## 2019-03-31 DIAGNOSIS — J45909 Unspecified asthma, uncomplicated: Secondary | ICD-10-CM

## 2019-03-31 NOTE — Telephone Encounter (Signed)
Medication Refill - Medication:  albuterol (VENTOLIN HFA) 108 (90 Base) MCG/ACT inhaler   Has the patient contacted their pharmacy? Yes advised to call office. Patient is requesting this as she is wheezing.   Preferred Pharmacy (with phone number or street name):  [id number] [address], [place]. [phone] (Phone) [phone] (Fax)   Agent: Please be advised that RX refills may take up to 3 business days. We ask that you follow-up with your pharmacy.

## 2019-03-31 NOTE — Telephone Encounter (Signed)
Requested medication (s) are due for refill today: yes  Requested medication (s) are on the active medication list: yes  Last refill:  12/02/2018  Future visit scheduled: yes  Notes to clinic:  Patient is wheezing and needs a refill   Requested Prescriptions  Pending Prescriptions Disp Refills   albuterol (VENTOLIN HFA) 108 (90 Base) MCG/ACT inhaler      Sig: INHALE 2 PUFFS INTO THE LUNGS EVERY 4 (FOUR) HOURS AS NEEDED FOR WHEEZING OR SHORTNESS OF BREATH.     Pulmonology:  Beta Agonists Failed - 03/31/2019  3:26 PM      Failed - One inhaler should last at least one month. If the patient is requesting refills earlier, contact the patient to check for uncontrolled symptoms.      Passed - Valid encounter within last 12 months    Recent Outpatient Visits          3 weeks ago Vitamin C deficiency   [facility], [place], NP   1 year ago Gout of right foot, unspecified cause, unspecified chronicity   Primary Care at [place], PA-C   1 year ago Anterolisthesis   Primary Care at [place], [place], [place]   1 year ago Frequency of urination   Primary Care at [place], PA-C   2 years ago Arthritis   Primary Care at [facility], [place], PA-C      Future Appointments            In 1 week Forrest Moron, MD Primary Care at [place], [facility]

## 2019-04-06 ENCOUNTER — Other Ambulatory Visit: Payer: Self-pay | Admitting: Registered Nurse

## 2019-04-06 DIAGNOSIS — M159 Polyosteoarthritis, unspecified: Secondary | ICD-10-CM

## 2019-04-08 ENCOUNTER — Ambulatory Visit: Payer: Self-pay | Admitting: *Deleted

## 2019-04-08 NOTE — Telephone Encounter (Signed)
Summary: pain in wrist    Patient requesting call back from NT to discuss pain in wrist.       Returned call to patient regarding wrist and knee pain. She has been using her right hand a lot and now it is bothering her. Also stated her right knee is painful.  She is unable to walk to the bathroom without assistance from her children. .  She had a virtual visit with a provider and was prescribed Celebrex and tramadol'. She has used the medications but they are not helping.  She is asking for a different medication to take for the pain. She denies fever or any cardiac symptoms. Advised to try a cold pack to her knee. She has tried and does not work for long. She already has a virtual appointment scheduled for Monday. She has been on Voltaren tabs and would like to try that again. Routing to Primary Care at [facility] for review and a call back.  Reason for Disposition . [1] SEVERE pain (e.g., excruciating, unable to walk) AND [2] not improved after 2 hours of pain medicine    Pain in the right knee. Needing assistance to walk to the bathroom.  Answer Assessment - Initial Assessment Questions 1. LOCATION and RADIATION: "Where is the pain located?"      Right knee and right hand 2. QUALITY: "What does the pain feel like?"  (e.g., sharp, dull, aching, burning)     aching 3. SEVERITY: "How bad is the pain?" "What does it keep you from doing?"   (Scale 1-10; or mild, moderate, severe)   -  MILD (1-3): doesn't interfere with normal activities    -  MODERATE (4-7): interferes with normal activities (e.g., work or school) or awakens from sleep, limping    -  SEVERE (8-10): excruciating pain, unable to do any normal activities, unable to walk     Moderate to severe 4. ONSET: "When did the pain start?" "Does it come and go, or is it there all the time?"     On going but has gotten worst 5. RECURRENT: "Have you had this pain before?" If so, ask: "When, and what happened then?"     On going, had virtual  visit and prescribed medication 6. SETTING: "Has there been any recent work, exercise or other activity that involved that part of the body?"      no 7. AGGRAVATING FACTORS: "What makes the knee pain worse?" (e.g., walking, climbing stairs, running)     walking 8. ASSOCIATED SYMPTOMS: "Is there any swelling or redness of the knee?"     Only swelling if she does not take her fluid pill 9. OTHER SYMPTOMS: "Do you have any other symptoms?" (e.g., chest pain, difficulty breathing, fever, calf pain)     no 10. PREGNANCY: "Is there any chance you are pregnant?" "When was your last menstrual period?"       n/a  Protocols used: KNEE PAIN-A-AH

## 2019-04-10 ENCOUNTER — Other Ambulatory Visit: Payer: Self-pay | Admitting: Registered Nurse

## 2019-04-10 DIAGNOSIS — F439 Reaction to severe stress, unspecified: Secondary | ICD-10-CM

## 2019-04-11 ENCOUNTER — Telehealth (INDEPENDENT_AMBULATORY_CARE_PROVIDER_SITE_OTHER): Payer: Medicare Other | Admitting: Family Medicine

## 2019-04-11 ENCOUNTER — Other Ambulatory Visit: Payer: Self-pay

## 2019-04-11 DIAGNOSIS — F439 Reaction to severe stress, unspecified: Secondary | ICD-10-CM

## 2019-04-11 DIAGNOSIS — R05 Cough: Secondary | ICD-10-CM

## 2019-04-11 DIAGNOSIS — G4700 Insomnia, unspecified: Secondary | ICD-10-CM | POA: Diagnosis not present

## 2019-04-11 DIAGNOSIS — R059 Cough, unspecified: Secondary | ICD-10-CM

## 2019-04-11 MED ORDER — ALPRAZOLAM 0.25 MG PO TABS: 0.1250 mg | ORAL_TABLET | Freq: Every day | ORAL | 0 refills | Status: DC

## 2019-04-11 MED ORDER — DEXTROMETHORPHAN HBR 10 MG/15ML PO SYRP: 15.0000 mL | ORAL_SOLUTION | Freq: Every day | ORAL | 3 refills | Status: DC

## 2019-04-11 NOTE — Telephone Encounter (Signed)
Pt being seen today in office for wrist issue.

## 2019-04-11 NOTE — Progress Notes (Signed)
Telemedicine Encounter- SOAP NOTE Established Patient  This telephone encounter was conducted with the patient's (or proxy's) verbal consent via audio telecommunications: yes/no: Yes Patient was instructed to have this encounter in a suitably private space; and to only have persons present to whom they give permission to participate. In addition, patient identity was confirmed by use of name plus two identifiers (DOB and address).  I discussed the limitations, risks, security and privacy concerns of performing an evaluation and management service by telephone and the availability of in person appointments. I also discussed with the patient that there may be a patient responsible charge related to this service. The patient expressed understanding and agreed to proceed.  I spent a total of TIME; 0 MIN TO 60 MIN: 15 minutes talking with the patient or their proxy.  No chief complaint on file.   Subjective   Amy Gallagher is a 85 y.o. established patient. Telephone visit today for  HPI  Patient reports that she needs refills of her cough syrup and uses it once a week if she has a cough and cuts down on the cough drops she is using for 50 years due to her chronic bronchitis.  She uses the dextromorphan.  She is using xanax if she gets an anxiety attacks She uses 2 adults with disabilities who cause her to a  She was on tramadol but has a hard time swallowing pills.   Patient Active Problem List   Diagnosis Date Noted  . Urinary urgency 06/10/2016  . Environmental allergies 08/14/2011  . Gout 09/10/2006  . DEPRESSION, MAJOR, RECURRENT 09/10/2006  . Former smoker 09/10/2006  . HYPERTENSION, BENIGN SYSTEMIC 09/10/2006  . Mild intermittent asthma 09/10/2006  . REFLUX ESOPHAGITIS 09/10/2006  . Arthritis 09/10/2006    Past Medical History:  Diagnosis Date  . Arthritis   . Bronchitis   . Gout     Current Outpatient Medications  Medication Sig Dispense Refill  . albuterol  (PROVENTIL) (2.5 MG/3ML) 0.083% nebulizer solution Take 3 mLs (2.5 mg total) by nebulization 2 (two) times daily as needed for wheezing or shortness of breath. 150 mL 1  . albuterol (VENTOLIN HFA) 108 (90 Base) MCG/ACT inhaler INHALE 2 PUFFS INTO THE LUNGS EVERY 4 (FOUR) HOURS AS NEEDED FOR WHEEZING OR SHORTNESS OF BREATH. 18 Inhaler 1  . allopurinol (ZYLOPRIM) 100 MG tablet Take 1 tablet (100 mg total) by mouth daily. 90 tablet 1  . ALPRAZolam (XANAX) 0.25 MG tablet Take 0.5 tablets (0.125 mg total) by mouth daily. 15 tablet 0  . celecoxib (CELEBREX) 50 MG capsule TAKE 1 CAPSULE BY MOUTH TWICE A DAY 60 capsule 0  . citalopram (CELEXA) 10 MG tablet TAKE 1 TABLET BY MOUTH EVERY DAY 30 tablet 0  . colchicine 0.6 MG tablet Take 1 tablet (0.6 mg total) by mouth daily. 30 tablet 0  . Dextromethorphan HBr 10 MG/15ML SYRP Take 15 mLs (10 mg total) by mouth daily. 120 mL 3  . diclofenac sodium (VOLTAREN) 1 % GEL APPLY 2-4GMS 2-3 TIMES DAILY AS NEEDED 400 g 3  . fluticasone (FLONASE) 50 MCG/ACT nasal spray Place 2 sprays into both nostrils daily. 16 g 11  . fluticasone (FLOVENT HFA) 44 MCG/ACT inhaler Inhale 2 puffs into the lungs 2 (two) times daily. 1 Inhaler 5  . hydrochlorothiazide (HYDRODIURIL) 25 MG tablet Take 1 tablet (25 mg total) by mouth daily. 90 tablet 1  . Misc. Devices [facility]) MISC 1 Device by Does not apply route daily. 1 each  0  . Multiple Vitamin (MULTIVITAMIN WITH MINERALS) TABS tablet Take 1 tablet by mouth daily.    . pantoprazole (PROTONIX) 40 MG tablet Take 1 tablet (40 mg total) by mouth daily. 90 tablet 0  . trolamine salicylate (ASPERCREME/ALOE) 10 % cream Apply 1 application topically as needed for muscle pain. 85 g 0  . vitamin C (ASCORBIC ACID) 500 MG tablet Take 1 tablet (500 mg total) by mouth daily. 90 tablet 3  . benzonatate (TESSALON) 100 MG capsule Take 1-2 capsules (100-200 mg total) by mouth 3 (three) times daily as needed for cough. (Patient not taking: Reported on  03/04/2019) 40 capsule 1   No current facility-administered medications for this visit.     Allergies  Allergen Reactions  . Aspirin Nausea And Vomiting  . Penicillins Swelling    Social History   Socioeconomic History  . Marital status: Widowed    Spouse name: Not on file  . Number of children: Not on file  . Years of education: Not on file  . Highest education level: Not on file  Occupational History  . Not on file  Social Needs  . Financial resource strain: Not on file  . Food insecurity    Worry: Not on file    Inability: Not on file  . Transportation needs    Medical: Not on file    Non-medical: Not on file  Tobacco Use  . Smoking status: Former Smoker    Types: Cigarettes    Quit date: 02/26/1997    Years since quitting: 22.1  . Smokeless tobacco: Never Used  Substance and Sexual Activity  . Alcohol use: No  . Drug use: No  . Sexual activity: Not on file  Lifestyle  . Physical activity    Days per week: Not on file    Minutes per session: Not on file  . Stress: Not on file  Relationships  . Social Musicianconnections    Talks on phone: Not on file    Gets together: Not on file    Attends religious service: Not on file    Active member of club or organization: Not on file    Attends meetings of clubs or organizations: Not on file    Relationship status: Not on file  . Intimate partner violence    Fear of current or ex partner: Not on file    Emotionally abused: Not on file    Physically abused: Not on file    Forced sexual activity: Not on file  Other Topics Concern  . Not on file  Social History Narrative   Patient cares for her mentally 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    Walks with a rolling walker.     ROS Review of Systems  Constitutional: Negative for activity change, appetite change, chills and fever.  HENT: Negative for congestion, nosebleeds, trouble swallowing and voice change.   Respiratory: see hpi  Gastrointestinal: Negative for diarrhea, nausea and vomiting.  Genitourinary: Negative for difficulty urinating, dysuria, flank pain and hematuria.  Musculoskeletal: Negative for back pain, joint swelling and neck pain.  Neurological: Negative for dizziness, speech difficulty, light-headedness and numbness.  See HPI. All other review of systems negative.   Objective   Vitals as reported by the patient: There were no vitals filed for this visit.  Normal pulmonary effort Alert and oriented  Diagnoses and all orders for this visit:  Stress  Insomnia, unspecified type -  ALPRAZolam (XANAX) 0.25 MG tablet; Take 0.5 tablets (0.125 mg total) by mouth daily.  Cough  Other orders -     Dextromethorphan HBr 10 MG/15ML SYRP; Take 15 mLs (10 mg total) by mouth daily.      Discussion Discussed that due to her age she should not be on so many meds that cause fall risk WILL NOT refill tramadol  Decreased quantity of dextromorphan  Refilled xanax   I discussed the assessment and treatment plan with the patient. The patient was provided an opportunity to ask questions and all were answered. The patient agreed with the plan and demonstrated an understanding of the instructions.   The patient was advised to call back or seek an in-person evaluation if the symptoms worsen or if the condition fails to improve as anticipated.  I provided 15 minutes of non-face-to-face time during this encounter.  Doristine Bosworth, MD  Primary Care at [facility]

## 2019-04-11 NOTE — Progress Notes (Signed)
CC: med refill on dextromethorphan.  C/o knee pain in both knees x 2 months and is wanting pain med for pain.  And pt is requesting rx for new nebulizer machine. No recent weight or bp taken.  No travel outside [place] or [place] in the past 3 weeks.

## 2019-04-11 NOTE — Patient Instructions (Signed)
° ° ° °  If you have lab work done today you will be contacted with your lab results within the next 2 weeks.  If you have not heard from us then please contact us. The fastest way to get your results is to register for My Chart. ° ° °IF you received an x-ray today, you will receive an invoice from [facility]. Please contact [facility] at [phone] with questions or concerns regarding your invoice.  ° °IF you received labwork today, you will receive an invoice from [facility]. Please contact [facility] at [phone] with questions or concerns regarding your invoice.  ° °Our billing staff will not be able to assist you with questions regarding bills from these companies. ° °You will be contacted with the lab results as soon as they are available. The fastest way to get your results is to activate your My Chart account. Instructions are located on the last page of this paperwork. If you have not heard from us regarding the results in 2 weeks, please contact this office. °  ° ° ° °

## 2019-04-11 NOTE — Telephone Encounter (Signed)
Requested medication (s) are due for refill today: yes  Requested medication (s) are on the active medication list: yes  Last refill:  03/26/2019  Future visit scheduled: yes  Notes to clinic:  Requesting 90 day supply   Requested Prescriptions  Pending Prescriptions Disp Refills   citalopram (CELEXA) 10 MG tablet [Pharmacy Med Name: CITALOPRAM HBR 10 MG TABLET] 90 tablet 1    Sig: TAKE 1 [place]     Psychiatry:  Antidepressants - SSRI Passed - 04/10/2019 11:41 AM      Passed - Valid encounter within last 6 months    Recent Outpatient Visits          1 month ago Vitamin C deficiency   [facility] at [place], NP   1 year ago Gout of right foot, unspecified cause, unspecified chronicity   [facility] at [place], PA-C   1 year ago Anterolisthesis   [facility] at [place], [place], [place]   1 year ago Frequency of urination   [facility] at [place], PA-C   2 years ago Arthritis   Primary Care at [facility], Gelene Mink, [place]      Future Appointments            Today Forrest Moron, MD [facility] at [place], [place] - Completed PHQ-2 or PHQ-9 in the last 360 days.

## 2019-04-12 ENCOUNTER — Telehealth: Payer: Self-pay | Admitting: *Deleted

## 2019-04-12 NOTE — Telephone Encounter (Signed)
Patient is requesting a refill of the following medications: Requested Prescriptions   Pending Prescriptions Disp Refills  . citalopram (CELEXA) 10 MG tablet [Pharmacy Med Name: CITALOPRAM HBR 10 MG TABLET] 90 tablet 1    Sig: TAKE 1 TABLET BY MOUTH EVERY DAY    Date of patient request: 04/10/19 Last office visit: 04/11/19 Date of last refill:  Last refill amount: 30 Follow up time period per chart: 04/11/19

## 2019-04-12 NOTE — Telephone Encounter (Signed)
Schedule AWV.  

## 2019-04-19 ENCOUNTER — Telehealth: Payer: Self-pay | Admitting: Family Medicine

## 2019-04-19 NOTE — Telephone Encounter (Signed)
Copied from [place] [id number]. Topic: General - Other >> Apr 19, 2019 10:30 AM Carolyn Stare wrote: Pt said she had a appt with Dr Nolon Rod and she told her she would order the below medication and she has contacted the pharmacy and they dont have it  DICLOFENAC VOLTAREN TABLET for pain   Pharmacy [place]

## 2019-04-19 NOTE — Telephone Encounter (Signed)
Sent message about rx to stallings and will contact pt as soon as she responds. Dgaddy, CMA

## 2019-04-25 NOTE — Telephone Encounter (Signed)
Please let the patient know that the diclofenac was sent in as a gel. Her last refill of the tablet was 2018. Is there a reason it was stopped? Please find out from the patient when she last took diclofenac tablet

## 2019-04-27 ENCOUNTER — Other Ambulatory Visit: Payer: Self-pay | Admitting: Family Medicine

## 2019-04-27 DIAGNOSIS — J45909 Unspecified asthma, uncomplicated: Secondary | ICD-10-CM

## 2019-04-27 DIAGNOSIS — R062 Wheezing: Secondary | ICD-10-CM

## 2019-04-27 NOTE — Telephone Encounter (Signed)
Informed pt of medication sent to pharmacy, she states she stop taking the tablet years ago because it made her feel funny. She also informed me that she needs a refill on albuterol, Flonase and a Rx for a chair that go over her toilet. Advised her that I would give her a call once these orders was place.

## 2019-04-27 NOTE — Telephone Encounter (Signed)
Requested medication (s) are due for refill today: yes  Requested medication (s) are on the active medication list: yes  Last refill:01/24/2019   Future visit scheduled: no  Notes to clinic:  One inhaler should last at least one month. If the patient is requesting refills earlier   Requested Prescriptions  Pending Prescriptions Disp Refills   VENTOLIN HFA 108 (90 Base) MCG/ACT inhaler [Pharmacy Med Name: VENTOLIN HFA 90 MCG INHALER]  1    Sig: INHALE 2 PUFFS INTO THE LUNGS EVERY 4 HOURS AS NEEDED FOR WHEEZING OR SHORTNESS OF BREATH NOV     Pulmonology:  Beta Agonists Failed - 04/27/2019 11:03 AM      Failed - One inhaler should last at least one month. If the patient is requesting refills earlier, contact the patient to check for uncontrolled symptoms.      Passed - Valid encounter within last 12 months    Recent Outpatient Visits          2 weeks ago Stress   Primary Care at [facility], Arlie Solomons, MD   1 month ago Vitamin C deficiency   [facility], [place], NP   1 year ago Gout of right foot, unspecified cause, unspecified chronicity   [facility] at [place], PA-C   1 year ago Anterolisthesis   Primary Care at [place], [place], [place]   2 years ago Frequency of urination   Primary Care at [place], PA-C

## 2019-04-28 ENCOUNTER — Telehealth: Payer: Self-pay | Admitting: Family Medicine

## 2019-04-28 ENCOUNTER — Other Ambulatory Visit: Payer: Self-pay

## 2019-04-28 ENCOUNTER — Other Ambulatory Visit: Payer: Self-pay | Admitting: Family Medicine

## 2019-04-28 DIAGNOSIS — J45909 Unspecified asthma, uncomplicated: Secondary | ICD-10-CM

## 2019-04-28 MED ORDER — FLUTICASONE PROPIONATE 50 MCG/ACT NA SUSP: 2.0000 | Freq: Every day | NASAL | 0 refills | Status: DC

## 2019-04-28 NOTE — Telephone Encounter (Signed)
Pt said she was supposed to receive prescriptions for her knee pain but never got it. Pt said it was discussed between her and pcp. She said its a gel and a pill Please advise.  Pt would like a cb today

## 2019-04-28 NOTE — Telephone Encounter (Signed)
Patient would like a refill on her fluticasone (FLONASE) 50 MCG/ACT nasal spray  medication and have it sent to [facility] [address].

## 2019-04-28 NOTE — Telephone Encounter (Addendum)
Patient is calling for the status on her prescriptions request. Please return call.

## 2019-04-29 NOTE — Telephone Encounter (Signed)
Pt. Called requesting the tablet form of diclofenac sodium for her knee joint pain. Pt. Asserts that Dr. Nolon Rod Was going to prescribe the medication.

## 2019-05-02 ENCOUNTER — Other Ambulatory Visit: Payer: Self-pay | Admitting: Family Medicine

## 2019-05-02 ENCOUNTER — Telehealth: Payer: Self-pay | Admitting: Family Medicine

## 2019-05-02 DIAGNOSIS — Z7409 Other reduced mobility: Secondary | ICD-10-CM

## 2019-05-02 DIAGNOSIS — M159 Polyosteoarthritis, unspecified: Secondary | ICD-10-CM

## 2019-05-02 DIAGNOSIS — Z5181 Encounter for therapeutic drug level monitoring: Secondary | ICD-10-CM

## 2019-05-02 MED ORDER — CELECOXIB 50 MG PO CAPS: 50.0000 mg | ORAL_CAPSULE | Freq: Two times a day (BID) | ORAL | 0 refills | Status: DC

## 2019-05-02 MED ORDER — COMMODE MISC: 0 refills | Status: AC

## 2019-05-02 MED ORDER — DICLOFENAC SODIUM 1 % TD GEL: TRANSDERMAL | 3 refills | Status: DC

## 2019-05-02 NOTE — Telephone Encounter (Signed)
Please reach out to  pt pt not happy with celebrex or topical wants something  More called in   FR

## 2019-05-02 NOTE — Telephone Encounter (Signed)
Patient returning call regarding.

## 2019-05-03 NOTE — Telephone Encounter (Signed)
Prescriptions were called in on 10-19.  She prescribed the diclofenac gel.

## 2019-05-04 ENCOUNTER — Other Ambulatory Visit: Payer: Self-pay

## 2019-05-04 MED ORDER — DICLOFENAC SODIUM 75 MG PO TBEC: 75.0000 mg | DELAYED_RELEASE_TABLET | Freq: Two times a day (BID) | ORAL | 3 refills | Status: DC

## 2019-05-05 ENCOUNTER — Ambulatory Visit: Payer: Medicare HMO

## 2019-05-06 NOTE — Telephone Encounter (Signed)
Please follow up with CMA team. This was addressed.

## 2019-05-08 ENCOUNTER — Other Ambulatory Visit: Payer: Self-pay | Admitting: Family Medicine

## 2019-05-08 DIAGNOSIS — G4700 Insomnia, unspecified: Secondary | ICD-10-CM

## 2019-05-09 NOTE — Telephone Encounter (Signed)
Requested medication (s) are due for refill today:yes  Requested medication (s) are on the active medication list: yes  Last refill:  04/11/2019  Future visit scheduled: no  Notes to clinic: refill cannot be delegated    Requested Prescriptions  Pending Prescriptions Disp Refills   ALPRAZolam (XANAX) 0.25 MG tablet [Pharmacy Med Name: ALPRAZOLAM 0.25 MG TABLET] 15 tablet 0    Sig: Take 0.5 tablets (0.125 mg total) by mouth daily.     Not Delegated - Psychiatry:  Anxiolytics/Hypnotics Failed - 05/08/2019 10:31 AM      Failed - This refill cannot be delegated      Failed - Urine Drug Screen completed in last 360 days.      Passed - Valid encounter within last 6 months    Recent Outpatient Visits          4 weeks ago Stress   [facility] Care at [facility], Arlie Solomons, MD   2 months ago Vitamin C deficiency   [facility], [place], NP   1 year ago Gout of right foot, unspecified cause, unspecified chronicity   [facility] at [place], PA-C   1 year ago Anterolisthesis   [facility] Care at [place], [place], [place]   2 years ago Frequency of urination   Primary Care at [place], PA-C

## 2019-05-23 ENCOUNTER — Telehealth: Payer: Self-pay | Admitting: Family Medicine

## 2019-05-23 NOTE — Telephone Encounter (Signed)
done

## 2019-05-23 NOTE — Telephone Encounter (Signed)
Orders for bedside commode  Fax number [phone] [facility]  pprwrk was sent in two weeks ago. Pt would like it faxed back today so her daughter can pick up equipment tomorrow

## 2019-05-25 ENCOUNTER — Telehealth: Payer: Self-pay | Admitting: *Deleted

## 2019-05-25 NOTE — Telephone Encounter (Signed)
Paperwork faxed for bedside commode.

## 2019-05-26 ENCOUNTER — Telehealth: Payer: Self-pay | Admitting: Family Medicine

## 2019-05-26 NOTE — Telephone Encounter (Signed)
Pt called stating she is having a gout flare up. Pt is requesting to have medication sent over for her. Please advise.   [id number] [address], [place] [address]  Phone: [phone] Fax: [phone]  Not a 24 hour pharmacy; exact hours not known.

## 2019-05-30 ENCOUNTER — Other Ambulatory Visit: Payer: Self-pay | Admitting: Family Medicine

## 2019-05-30 DIAGNOSIS — J45909 Unspecified asthma, uncomplicated: Secondary | ICD-10-CM

## 2019-05-31 ENCOUNTER — Telehealth: Payer: Self-pay | Admitting: Family Medicine

## 2019-05-31 NOTE — Telephone Encounter (Signed)
Copied from [place] [id number]. Topic: General - Call Back - No Documentation >> May 31, 2019  3:08 PM Erick Blinks wrote: Reason for CRM: Pt is requesting call back from Nurse, please advise  Best contact: [phone]

## 2019-06-01 ENCOUNTER — Telehealth: Payer: Self-pay | Admitting: Family Medicine

## 2019-06-01 NOTE — Telephone Encounter (Signed)
I have called [facility] back and informed her that we have faxed over the script for Bedside commode and last visit with it. PPK faxed to [phone]. Spoke to [place] with home health and informed her of this.

## 2019-06-01 NOTE — Telephone Encounter (Signed)
Tayli called wants Dr, Kaleen Mask Nurse to fax over order from [facility] for bedside commode standard size ASAP  !!!  Please !!!  If no order call [place] at  [phone]  Pt needs this sooner than possible FR

## 2019-06-01 NOTE — Telephone Encounter (Signed)
[facility] fax number for commode is [phone] !

## 2019-06-13 ENCOUNTER — Telehealth: Payer: Self-pay | Admitting: Family Medicine

## 2019-06-13 NOTE — Telephone Encounter (Signed)
Amy Gallagher is calling concerning recertification paperwork dealing with orders for pull ups and pads. She faxed paperwork over on the 05/25/19. Please fax over when complete. Call Melanie if any questions. [phone]

## 2019-06-14 NOTE — Telephone Encounter (Signed)
Forms completed and sent via fax to [phone].  Amy Gallagher called office advising second page of fax cut off and needs it refaxed.  Forgot to refax on 06/13/2019 but refaxed today 06/14/2019.

## 2019-06-23 ENCOUNTER — Telehealth: Payer: Self-pay | Admitting: Family Medicine

## 2019-06-23 NOTE — Telephone Encounter (Signed)
Spoke with pt and she is wanting to talk with dr Nolon Rod about the ? Arthritic pain in her feet and ? Med to help so she can start to move around as she is accustom to.  Pt scheduled for telemed visit w/ [facility] on 07/06/2019 at 11:20 am.  Pt agreeable.

## 2019-06-23 NOTE — Telephone Encounter (Signed)
Will have provider sign on 06/27/2019. I signed it and put my name behind yours but they are only wanting your signature.

## 2019-06-23 NOTE — Telephone Encounter (Signed)
Called to request the medicaid cmn forms to be signed by provider when provider returns

## 2019-06-24 ENCOUNTER — Other Ambulatory Visit: Payer: Self-pay

## 2019-06-24 ENCOUNTER — Encounter (HOSPITAL_COMMUNITY): Payer: Self-pay | Admitting: Emergency Medicine

## 2019-06-24 ENCOUNTER — Emergency Department (HOSPITAL_COMMUNITY)
Admission: EM | Admit: 2019-06-24 | Discharge: 2019-06-24 | Disposition: A | Discharge status: Home / Self Care | Payer: Medicare HMO | Attending: Emergency Medicine | Admitting: Emergency Medicine

## 2019-06-24 ENCOUNTER — Emergency Department (HOSPITAL_COMMUNITY): Payer: Medicare HMO

## 2019-06-24 DIAGNOSIS — Z79899 Other long term (current) drug therapy: Secondary | ICD-10-CM | POA: Insufficient documentation

## 2019-06-24 DIAGNOSIS — R2242 Localized swelling, mass and lump, left lower limb: Secondary | ICD-10-CM | POA: Insufficient documentation

## 2019-06-24 DIAGNOSIS — M79672 Pain in left foot: Secondary | ICD-10-CM | POA: Insufficient documentation

## 2019-06-24 DIAGNOSIS — Z87891 Personal history of nicotine dependence: Secondary | ICD-10-CM | POA: Diagnosis not present

## 2019-06-24 DIAGNOSIS — M79671 Pain in right foot: Secondary | ICD-10-CM | POA: Diagnosis present

## 2019-06-24 DIAGNOSIS — M25572 Pain in left ankle and joints of left foot: Secondary | ICD-10-CM | POA: Insufficient documentation

## 2019-06-24 DIAGNOSIS — J45909 Unspecified asthma, uncomplicated: Secondary | ICD-10-CM | POA: Diagnosis not present

## 2019-06-24 DIAGNOSIS — M25472 Effusion, left ankle: Secondary | ICD-10-CM | POA: Diagnosis not present

## 2019-06-24 MED ORDER — ACETAMINOPHEN 500 MG PO TABS
1000.0000 mg | ORAL_TABLET | Freq: Once | ORAL | Status: AC
  Administered 2019-06-24: 18:00:00 1000 mg via ORAL
  Filled 2019-06-24: qty 2

## 2019-06-24 MED ORDER — DICLOFENAC SODIUM 1 % EX GEL
4.0000 g | Freq: Once | CUTANEOUS | Status: AC
  Administered 2019-06-24: 4 g via TOPICAL
  Filled 2019-06-24: qty 100

## 2019-06-24 NOTE — Discharge Instructions (Addendum)
I suspect this pain is related to your arthritis please use Tylenol 1000 mg every 8 hours, and use Voltaren gel 4 times daily, use Ace wrap to help with swelling and support and ice and elevate the foot.  With your PCP, given your long history of arthritis I think you would also benefit from seeing orthopedics, please call to schedule follow-up appointment.  Return to the ED if you have worsening swelling, the ankle becomes red or hot to the touch, you develop fevers, significantly worsening pain or any other new or concerning symptoms.

## 2019-06-24 NOTE — ED Provider Notes (Signed)
[facility] DEPT Provider Note   CSN: [id number] Arrival date & time: 06/24/19  1355     History Chief Complaint  Patient presents with  . Foot Pain    Amy Gallagher is a 85 y.o. female.  Amy Gallagher is a 85 y.o. female with a history of gout, arthritis, bronchitis, hypertension, asthma, and depression, who presents to the emergency department for evaluation of bilateral foot pain.  Patient states that is all started the second week of November when she started having some pain in her left ankle and foot.  Patient reports she noticed some swelling in the left ankle as well.  She denies any known injury, is not sure if she twisted it or anything.  She states that because she was walking with a limp to favor her left ankle she started to have some pain in her right foot as well although did not note any swelling in the right foot.  She states that she was put on a course of prednisone last week because she thought this was her gout, she reports that the prednisone initially helped with her pain, although did not completely resolve it, but since she has been done with the prednisone her pain has continued.  She has not taken anything else to treat this.  She states that she has not seen her PCP regarding this pain.  She denies any fevers.  States that the pain has made it difficult for her to walk but she has not had any falls.  Reports she has a history of arthritis in both of her feet and her knee as well.  No other aggravating or alleviating factors.        Past Medical History:  Diagnosis Date  . Arthritis   . Bronchitis   . Gout     Patient Active Problem List   Diagnosis Date Noted  . Urinary urgency 06/10/2016  . Environmental allergies 08/14/2011  . Gout 09/10/2006  . DEPRESSION, MAJOR, RECURRENT 09/10/2006  . Former smoker 09/10/2006  . HYPERTENSION, BENIGN SYSTEMIC 09/10/2006  . Mild intermittent asthma 09/10/2006  . REFLUX  ESOPHAGITIS 09/10/2006  . Arthritis 09/10/2006    Past Surgical History:  Procedure Laterality Date  . SALPINGECTOMY     right     OB History   No obstetric history on file.     No family history on file.  Social History   Tobacco Use  . Smoking status: Former Smoker    Types: Cigarettes    Quit date: 02/26/1997    Years since quitting: 22.3  . Smokeless tobacco: Never Used  Substance Use Topics  . Alcohol use: No  . Drug use: No    Home Medications Prior to Admission medications   Medication Sig Start Date End Date Taking? Authorizing Provider  albuterol (PROVENTIL) (2.5 MG/3ML) 0.083% nebulizer solution Take 3 mLs (2.5 mg total) by nebulization 2 (two) times daily as needed for wheezing or shortness of breath. 02/03/19   Delia Chimes A, MD  albuterol (VENTOLIN HFA) 108 (90 Base) MCG/ACT inhaler INHALE 2 PUFFS INTO THE LUNGS EVERY 4 HOURS AS NEEDED FOR WHEEZING OR SHORTNESS OF BREATH NOV 04/27/19   Delia Chimes A, MD  allopurinol (ZYLOPRIM) 100 MG tablet Take 1 tablet (100 mg total) by mouth daily. 03/17/18   Weber, Damaris Hippo, PA-C  ALPRAZolam (XANAX) 0.25 MG tablet TAKE 0.5 TABLETS (0.125 MG TOTAL) BY MOUTH DAILY. 05/12/19   Forrest Moron, MD  benzonatate (TESSALON)  100 MG capsule Take 1-2 capsules (100-200 mg total) by mouth 3 (three) times daily as needed for cough. Patient not taking: Reported on 03/04/2019 03/17/18   Morrell Riddle, PA-C  celecoxib (CELEBREX) 50 MG capsule Take 1 capsule (50 mg total) by mouth 2 (two) times daily. 05/02/19   Doristine Bosworth, MD  citalopram (CELEXA) 10 MG tablet TAKE 1 TABLET BY MOUTH EVERY DAY 04/13/19   Collie Siad A, MD  colchicine 0.6 MG tablet Take 1 tablet (0.6 mg total) by mouth daily. 03/17/18   Weber, Dema Severin, PA-C  Dextromethorphan HBr 10 MG/15ML SYRP Take 15 mLs (10 mg total) by mouth daily. 04/11/19   Doristine Bosworth, MD  diclofenac (VOLTAREN) 75 MG EC tablet Take 1 tablet (75 mg total) by mouth 2 (two) times daily. 05/04/19    Doristine Bosworth, MD  diclofenac sodium (VOLTAREN) 1 % GEL APPLY 2-4GMS 2-3 TIMES DAILY AS NEEDED 05/02/19   Collie Siad A, MD  fluticasone (FLONASE) 50 MCG/ACT nasal spray SPRAY 2 SPRAYS INTO EACH NOSTRIL EVERY DAY 05/30/19   Doristine Bosworth, MD  fluticasone (FLOVENT HFA) 44 MCG/ACT inhaler Inhale 2 puffs into the lungs 2 (two) times daily. 03/17/18   Weber, Dema Severin, PA-C  hydrochlorothiazide (HYDRODIURIL) 25 MG tablet Take 1 tablet (25 mg total) by mouth daily. 03/04/19   [name], NP  Misc. Devices (COMMODE) MISC Please dispense one rail for commode. ICD10 Z74.09 05/02/19   Doristine Bosworth, MD  Misc. Devices [facility]) MISC 1 Device by Does not apply route daily. 04/27/17   Weber, Dema Severin, PA-C  Multiple Vitamin (MULTIVITAMIN WITH MINERALS) TABS tablet Take 1 tablet by mouth daily.    [provider]  pantoprazole (PROTONIX) 40 MG tablet Take 1 tablet (40 mg total) by mouth daily. 03/04/19   [name], NP  trolamine salicylate (ASPERCREME/ALOE) 10 % cream Apply 1 application topically as needed for muscle pain. 09/24/16   McVey, Madelaine Bhat, PA-C  vitamin C (ASCORBIC ACID) 500 MG tablet Take 1 tablet (500 mg total) by mouth daily. 03/04/19   [name], NP    Allergies    Aspirin and Penicillins  Review of Systems   Review of Systems  Constitutional: Negative for chills and fever.  Musculoskeletal: Positive for arthralgias and joint swelling.  Skin: Negative for color change and rash.  Neurological: Negative for weakness and numbness.    Physical Exam Updated Vital Signs BP (!) 156/75 (BP Location: Right Arm)   Pulse 72   Temp 98.4 F (36.9 C) (Oral)   Resp 18   SpO2 98%   Physical Exam Vitals and nursing note reviewed.  Constitutional:      General: She is not in acute distress.    Appearance: She is well-developed. She is not diaphoretic.  HENT:     Head: Normocephalic and atraumatic.  Eyes:     General:        Right eye: No  discharge.        Left eye: No discharge.  Pulmonary:     Effort: Pulmonary effort is normal. No respiratory distress.  Musculoskeletal:     Comments: Left ankle is tender to the touch with some swelling but it is not erythematous or warm.  Patient is able to dorsi and plantarflex with some discomfort.  No swelling or deformity noted to the right foot or ankle.  2+ DP pulses bilaterally.  Sensation intact.  Skin:    General: Skin is warm and dry.  Neurological:     Mental Status: She is alert and oriented to person, place, and time.     Coordination: Coordination normal.  Psychiatric:        Mood and Affect: Mood normal.        Behavior: Behavior normal.     ED Results / Procedures / Treatments   Labs (all labs ordered are listed, but only abnormal results are displayed) Labs Reviewed - No data to display  EKG None  Radiology DG Ankle Complete Left  Result Date: 06/24/2019 CLINICAL DATA:  Ankle pain and swelling.  No injury EXAM: LEFT ANKLE COMPLETE - 3+ VIEW COMPARISON:  None. FINDINGS: Diffuse soft tissue swelling most prominent medially. Ankle joint effusion. Negative for fracture. Mild degenerative changes in the distal fibula and medial malleolus. Arterial calcification IMPRESSION: Joint effusion with soft tissue swelling. No acute skeletal abnormality. Electronically Signed   By: Marlan Palauharles  Clark M.D.   On: 06/24/2019 17:28    Procedures Procedures (including critical care time)  Medications Ordered in ED Medications  diclofenac Sodium (VOLTAREN) 1 % topical gel 4 g (4 g Topical Given 06/24/19 1829)  acetaminophen (TYLENOL) tablet 1,000 mg (1,000 mg Oral Given 06/24/19 1756)    ED Course  I have reviewed the triage vital signs and the nursing notes.  Pertinent labs & imaging results that were available during my care of the patient were reviewed by me and considered in my medical decision making (see chart for details).  Clinical Course as of Jun 23 2026  Fri Jun 24, 2019  1740 X-ray shows joint effusion with soft tissue swelling, no acute bony abnormality.  DG Ankle Complete Left [KF]    Clinical Course User Index [KF] Legrand RamsFord, Ugo Thoma N, PA-C   MDM Rules/Calculators/A&P  85 year old female presents with left ankle and foot pain over the past month.  History of gout and arthritis.  States that she was treated with a course of prednisone last week and had initial improvement but pain has continued.  Given that patient was treated with prednisone I would suspect that if this were septic arthritis it would have gotten significantly worse but patient has not had a significant increase in swelling, denies any fevers, redness or warmth.  X-ray shows effusion with soft tissue swelling I suspect this is related to arthritis we will treat with Voltaren gel, Tylenol, Ace wrap, ice, heat and elevation and have patient follow-up with PCP and orthopedics.  Patient expresses understanding and agreement with this plan.  Discharged home in good condition.  Patient discussed with Dr. Freida BusmanAllen, who saw patient as well and agrees with plan.   Final Clinical Impression(s) / ED Diagnoses Final diagnoses:  Acute left ankle pain    Rx / DC Orders ED Discharge Orders    None       Legrand RamsFord, Mikkel Charrette N, PA-C 06/24/19 2029    Lorre NickAllen, Anthony, MD 06/27/19 1720

## 2019-06-24 NOTE — ED Notes (Signed)
ED Provider at bedside. 

## 2019-06-24 NOTE — ED Triage Notes (Signed)
Patient c/o bilateral foot pain that worsens with movement x1 month. Denies injury and wounds. States difficulty walking. Hx gout.

## 2019-06-24 NOTE — ED Notes (Signed)
Visitor at bedside.

## 2019-06-24 NOTE — ED Provider Notes (Signed)
Medical screening examination/treatment/procedure(s) were conducted as a shared visit with non-physician practitioner(s) and myself.  I personally evaluated the patient during the encounter.    85 year old female presents with recurrent pain to her ankles.  Has history of osteoarthritis and has been on prednisone which she relieve her symptoms.  Now notes worsening pain mostly on her left ankle.  She is neurovascular tact and will order x-rays.  Suspect arthritis as cause and will treat and have patient follow-up with her doctor.   Lacretia Leigh, MD 06/24/19 [phone]

## 2019-06-27 ENCOUNTER — Telehealth: Payer: Self-pay | Admitting: Family Medicine

## 2019-06-27 ENCOUNTER — Other Ambulatory Visit: Payer: Self-pay | Admitting: Registered Nurse

## 2019-06-27 DIAGNOSIS — M159 Polyosteoarthritis, unspecified: Secondary | ICD-10-CM

## 2019-06-27 NOTE — Telephone Encounter (Signed)
Pt called and is requesting to have this medication sent in. Pt states she is in a lot of pain and can barely walk. Please advise.

## 2019-06-27 NOTE — Telephone Encounter (Signed)
Requested medication (s) are due for refill today: yes  Requested medication (s) are on the active medication list: yes  Last refill:  05/02/2019  Future visit scheduled: yes  Notes to clinic: Need Dx code due for labs appointment 1 week    Requested Prescriptions  Pending Prescriptions Disp Refills   celecoxib (CELEBREX) 50 MG capsule [Pharmacy Med Name: CELECOXIB 50 MG CAPSULE] 60 capsule 0    Sig: TAKE 1 CAPSULE BY MOUTH TWICE A DAY      Analgesics:  COX2 Inhibitors Failed - 06/27/2019  3:24 PM      Failed - HGB in normal range and within 360 days    Hemoglobin  Date Value Ref Range Status  11/08/2015 13.0 11.7 - 15.5 g/dL Final          Failed - Cr in normal range and within 360 days    Creat  Date Value Ref Range Status  11/08/2015 0.83 0.60 - 0.88 mg/dL Final   Creatinine, Ser  Date Value Ref Range Status  08/07/2017 0.82 0.57 - 1.00 mg/dL Final          Passed - Patient is not pregnant      Passed - Valid encounter within last 12 months    Recent Outpatient Visits           2 months ago Stress   [facility] Care at [facility], Arlie Solomons, MD   3 months ago Vitamin C deficiency   [facility], [place], NP   1 year ago Gout of right foot, unspecified cause, unspecified chronicity   [facility], Damaris Hippo, PA-C   1 year ago Anterolisthesis   [facility] Care at [place], [place], [place]   2 years ago Frequency of urination   Primary Care at [place], PA-C       Future Appointments             In 1 week Forrest Moron, MD Primary Care at [place], [facility]

## 2019-06-27 NOTE — Telephone Encounter (Signed)
Pt called on 11/12 for a med refill for her gout. [facility] [address]  Pt's daughter would like to pick up today Pt refused appt

## 2019-06-28 ENCOUNTER — Other Ambulatory Visit: Payer: Self-pay

## 2019-06-28 ENCOUNTER — Other Ambulatory Visit: Payer: Self-pay | Admitting: Family Medicine

## 2019-06-28 ENCOUNTER — Telehealth: Payer: Self-pay | Admitting: Family Medicine

## 2019-06-28 DIAGNOSIS — M109 Gout, unspecified: Secondary | ICD-10-CM

## 2019-06-28 MED ORDER — COLCHICINE 0.6 MG PO TABS: 0.6000 mg | ORAL_TABLET | Freq: Every day | ORAL | 0 refills | Status: DC

## 2019-06-28 MED ORDER — ALLOPURINOL 100 MG PO TABS: 100.0000 mg | ORAL_TABLET | Freq: Every day | ORAL | 0 refills | Status: DC

## 2019-06-28 NOTE — Telephone Encounter (Signed)
Pt is calling back for Med refills for Gout pain  colchicine 0.6 MG tablet [[id number]       FR

## 2019-06-28 NOTE — Telephone Encounter (Signed)
Pt called back to check on status of forms. To be faxed to [phone] when completed

## 2019-06-28 NOTE — Telephone Encounter (Signed)
Request for a 90 day supply of colchicine 0.6 mg instead of 30 tablets.

## 2019-06-29 NOTE — Telephone Encounter (Signed)
This colchi cine was filled yesterday

## 2019-07-05 ENCOUNTER — Telehealth: Payer: Self-pay

## 2019-07-05 ENCOUNTER — Other Ambulatory Visit: Payer: Self-pay | Admitting: Family Medicine

## 2019-07-05 DIAGNOSIS — J45909 Unspecified asthma, uncomplicated: Secondary | ICD-10-CM

## 2019-07-05 NOTE — Telephone Encounter (Signed)
Spoke with Threasa Beards an asked to resend CMN forms to my attention and I will have [facility] sign on tomorrow 07/06/2019 and fax back to her immediately.  She is agreeable with plan of action. Dgaddy, CMA (AAMA)

## 2019-07-06 ENCOUNTER — Telehealth (INDEPENDENT_AMBULATORY_CARE_PROVIDER_SITE_OTHER): Payer: Medicare HMO | Admitting: Family Medicine

## 2019-07-06 ENCOUNTER — Other Ambulatory Visit: Payer: Self-pay | Admitting: Registered Nurse

## 2019-07-06 ENCOUNTER — Telehealth: Payer: Medicare HMO | Admitting: Family Medicine

## 2019-07-06 ENCOUNTER — Other Ambulatory Visit: Payer: Self-pay

## 2019-07-06 DIAGNOSIS — M109 Gout, unspecified: Secondary | ICD-10-CM

## 2019-07-06 DIAGNOSIS — R12 Heartburn: Secondary | ICD-10-CM

## 2019-07-06 MED ORDER — COLCHICINE 0.6 MG PO TABS: 0.6000 mg | ORAL_TABLET | Freq: Two times a day (BID) | ORAL | 2 refills | Status: DC

## 2019-07-06 NOTE — Progress Notes (Signed)
YY:TKPT flare since 05/18/2019, pain level 8.5/10, taking otc tylenol 8 and colchicine.  Per pt gout med helping no longer has gout in big toe but is now in ankle of left foot.  Per pt very painful to walk and she hasn't been able to do much due to the pain.  Pt requesting med refills on flonase, albuterol and xanax.  No recent bp or weight taken.  No travel outside the [place] or [place] in the past 3 weeks.

## 2019-07-06 NOTE — Patient Instructions (Signed)
° ° ° °  If you have lab work done today you will be contacted with your lab results within the next 2 weeks.  If you have not heard from us then please contact us. The fastest way to get your results is to register for My Chart. ° ° °IF you received an x-ray today, you will receive an invoice from [facility]. Please contact [facility] at [phone] with questions or concerns regarding your invoice.  ° °IF you received labwork today, you will receive an invoice from [facility]. Please contact [facility] at [phone] with questions or concerns regarding your invoice.  ° °Our billing staff will not be able to assist you with questions regarding bills from these companies. ° °You will be contacted with the lab results as soon as they are available. The fastest way to get your results is to activate your My Chart account. Instructions are located on the last page of this paperwork. If you have not heard from us regarding the results in 2 weeks, please contact this office. °  ° ° ° °

## 2019-07-06 NOTE — Progress Notes (Signed)
Telemedicine Encounter- SOAP NOTE Established Patient  This telephone encounter was conducted with the patient's (or proxy's) verbal consent via audio telecommunications: yes/no: Yes Patient was instructed to have this encounter in a suitably private space; and to only have persons present to whom they give permission to participate. In addition, patient identity was confirmed by use of name plus two identifiers (DOB and address).  I discussed the limitations, risks, security and privacy concerns of performing an evaluation and management service by telephone and the availability of in person appointments. I also discussed with the patient that there may be a patient responsible charge related to this service. The patient expressed understanding and agreed to proceed.  I spent a total of TIME; 0 MIN TO 60 MIN: 15 minutes talking with the patient or their proxy.  Chief Complaint  Patient presents with  . Gout    since 05/18/2019.  pain level 8.5/10.  pain w/ walking, taking tylenol 8 and colchicine.    . Medication Refill    xanax, flonase, abuterol inhaler    Subjective   Amy Gallagher is a 85 y.o. established patient. Telephone visit today for  HPI  Patient reports that she has been having pain from the gout in her foot and ankle She has to use a bedside commode She has pain and swelling in the ankle and foot on the left She reports that she feels like the colchicine once a day is not helping The tylenol is not helping In the past tramadol made her sick   Patient Active Problem List   Diagnosis Date Noted  . Urinary urgency 06/10/2016  . Environmental allergies 08/14/2011  . Gout 09/10/2006  . DEPRESSION, MAJOR, RECURRENT 09/10/2006  . Former smoker 09/10/2006  . HYPERTENSION, BENIGN SYSTEMIC 09/10/2006  . Mild intermittent asthma 09/10/2006  . REFLUX ESOPHAGITIS 09/10/2006  . Arthritis 09/10/2006    Past Medical History:  Diagnosis Date  . Arthritis   .  Bronchitis   . Gout     Current Outpatient Medications  Medication Sig Dispense Refill  . albuterol (PROVENTIL) (2.5 MG/3ML) 0.083% nebulizer solution Take 3 mLs (2.5 mg total) by nebulization 2 (two) times daily as needed for wheezing or shortness of breath. 150 mL 1  . albuterol (VENTOLIN HFA) 108 (90 Base) MCG/ACT inhaler INHALE 2 PUFFS INTO THE LUNGS EVERY 4 HOURS AS NEEDED FOR WHEEZING OR SHORTNESS OF BREATH NOV 18 g 1  . allopurinol (ZYLOPRIM) 100 MG tablet Take 1 tablet (100 mg total) by mouth daily. 30 tablet 0  . ALPRAZolam (XANAX) 0.25 MG tablet TAKE 0.5 TABLETS (0.125 MG TOTAL) BY MOUTH DAILY. 15 tablet 0  . colchicine 0.6 MG tablet Take 1 tablet (0.6 mg total) by mouth 2 (two) times daily. Take for gout attack. 60 tablet 2  . diclofenac sodium (VOLTAREN) 1 % GEL APPLY 2-4GMS 2-3 TIMES DAILY AS NEEDED 400 g 3  . fluticasone (FLONASE) 50 MCG/ACT nasal spray SPRAY 2 SPRAYS INTO EACH NOSTRIL EVERY DAY 16 mL 0  . fluticasone (FLOVENT HFA) 44 MCG/ACT inhaler Inhale 2 puffs into the lungs 2 (two) times daily. 1 Inhaler 5  . hydrochlorothiazide (HYDRODIURIL) 25 MG tablet Take 1 tablet (25 mg total) by mouth daily. 90 tablet 1  . Misc. Devices (COMMODE) MISC Please dispense one rail for commode. ICD10 Z74.09 1 each 0  . Misc. Devices [facility](WHEELCHAIR) MISC 1 Device by Does not apply route daily. 1 each 0  . Multiple Vitamin (MULTIVITAMIN WITH MINERALS) TABS  tablet Take 1 tablet by mouth daily.    . pantoprazole (PROTONIX) 40 MG tablet TAKE 1 TABLET BY MOUTH EVERY DAY 90 tablet 0  . vitamin C (ASCORBIC ACID) 500 MG tablet Take 1 tablet (500 mg total) by mouth daily. 90 tablet 3  . benzonatate (TESSALON) 100 MG capsule Take 1-2 capsules (100-200 mg total) by mouth 3 (three) times daily as needed for cough. (Patient not taking: Reported on 03/04/2019) 40 capsule 1  . celecoxib (CELEBREX) 50 MG capsule TAKE 1 CAPSULE BY MOUTH TWICE A DAY (Patient not taking: Reported on 07/06/2019) 60 capsule 0  .  citalopram (CELEXA) 10 MG tablet TAKE 1 TABLET BY MOUTH EVERY DAY (Patient not taking: Reported on 07/06/2019) 90 tablet 1  . Dextromethorphan HBr 10 MG/15ML SYRP Take 15 mLs (10 mg total) by mouth daily. (Patient not taking: Reported on 07/06/2019) 120 mL 3  . diclofenac (VOLTAREN) 75 MG EC tablet Take 1 tablet (75 mg total) by mouth 2 (two) times daily. (Patient not taking: Reported on 07/06/2019) 60 tablet 3  . trolamine salicylate (ASPERCREME/ALOE) 10 % cream Apply 1 application topically as needed for muscle pain. (Patient not taking: Reported on 07/06/2019) 85 g 0   No current facility-administered medications for this visit.    Allergies  Allergen Reactions  . Aspirin Nausea And Vomiting  . Penicillins Swelling    Social History   Socioeconomic History  . Marital status: Widowed    Spouse name: Not on file  . Number of children: Not on file  . Years of education: Not on file  . Highest education level: Not on file  Occupational History  . Not on file  Tobacco Use  . Smoking status: Former Smoker    Types: Cigarettes    Quit date: 02/26/1997    Years since quitting: 22.3  . Smokeless tobacco: Never Used  Substance and Sexual Activity  . Alcohol use: No  . Drug use: No  . Sexual activity: Not on file  Other Topics Concern  . Not on file  Social History Narrative   Patient cares for her mentally 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    Walks with a rolling walker.    Social Determinants of Health   Financial Resource Strain:   . Difficulty of Paying Living Expenses: Not on file  Food Insecurity:   . Worried About Programme researcher, broadcasting/film/video in the Last Year: Not on file  . Ran Out of Food in the Last Year: Not on file  Transportation Needs:   . Lack of Transportation (Medical): Not on file  . Lack of Transportation (Non-Medical): Not on file  Physical Activity:   . Days of Exercise per Week: Not on file  . Minutes of Exercise per  Session: Not on file  Stress:   . Feeling of Stress : Not on file  Social Connections:   . Frequency of Communication with Friends and Family: Not on file  . Frequency of Social Gatherings with Friends and Family: Not on file  . Attends Religious Services: Not on file  . Active Member of Clubs or Organizations: Not on file  . Attends Banker Meetings: Not on file  . Marital Status: Not on file  Intimate Partner Violence:   . Fear of Current or Ex-Partner: Not on file  . Emotionally Abused: Not on file  . Physically Abused: Not on file  . Sexually Abused: Not on file  ROS Review of Systems  Constitutional: Negative for activity change, appetite change, chills and fever.  HENT: Negative for congestion, nosebleeds, trouble swallowing and voice change.   Respiratory: Negative for cough, shortness of breath and wheezing.   Gastrointestinal: Negative for diarrhea, nausea and vomiting.  Genitourinary: Negative for difficulty urinating, dysuria, flank pain and hematuria.  Musculoskeletal: see hpi Neurological: Negative for dizziness, speech difficulty, light-headedness and numbness.  See HPI. All other review of systems negative.   Objective   Vitals as reported by the patient: There were no vitals filed for this visit.  Jakeisha was seen today for gout and medication refill.  Diagnoses and all orders for this visit:  Gout of right foot, unspecified cause, unspecified chronicity -     colchicine 0.6 MG tablet; Take 1 tablet (0.6 mg total) by mouth 2 (two) times daily. Take for gout attack.   Increased colchicine to bid Will not refill beyond that without BMP  Bmp ordered   I discussed the assessment and treatment plan with the patient. The patient was provided an opportunity to ask questions and all were answered. The patient agreed with the plan and demonstrated an understanding of the instructions.   The patient was advised to call back or seek an in-person  evaluation if the symptoms worsen or if the condition fails to improve as anticipated.  I provided 15 minutes of non-face-to-face time during this encounter.  Forrest Moron, MD  Primary Care at [facility]

## 2019-07-20 ENCOUNTER — Other Ambulatory Visit: Payer: Self-pay | Admitting: Family Medicine

## 2019-07-20 DIAGNOSIS — M109 Gout, unspecified: Secondary | ICD-10-CM

## 2019-08-03 ENCOUNTER — Other Ambulatory Visit: Payer: Self-pay | Admitting: Family Medicine

## 2019-08-03 DIAGNOSIS — J45909 Unspecified asthma, uncomplicated: Secondary | ICD-10-CM

## 2019-08-04 ENCOUNTER — Ambulatory Visit: Payer: Medicare HMO | Admitting: Family Medicine

## 2019-08-04 ENCOUNTER — Other Ambulatory Visit: Payer: Self-pay | Admitting: Family Medicine

## 2019-08-04 DIAGNOSIS — M109 Gout, unspecified: Secondary | ICD-10-CM

## 2019-08-04 NOTE — Telephone Encounter (Signed)
Requested medication (s) are due for refill today:no  Requested medication (s) are on the active medication list: yes  Last refill:  07/20/19  #30  0 refills  Future visit scheduled: today  Notes to clinic:  Patient needs labs  Pharmacy requesting 90 day RX    Requested Prescriptions  Pending Prescriptions Disp Refills   allopurinol (ZYLOPRIM) 100 MG tablet [Pharmacy Med Name: ALLOPURINOL 100 MG TABLET] 90 tablet 1    Sig: TAKE 1 TABLET BY MOUTH EVERY DAY      Endocrinology:  Gout Agents Failed - 08/04/2019  9:18 AM      Failed - Uric Acid in normal range and within 360 days    Uric Acid  Date Value Ref Range Status  03/17/2018 7.8 (H) 2.5 - 7.1 mg/dL Final    Comment:               Therapeutic target for gout patients: <6.0          Failed - Cr in normal range and within 360 days    Creat  Date Value Ref Range Status  11/08/2015 0.83 0.60 - 0.88 mg/dL Final   Creatinine, Ser  Date Value Ref Range Status  08/07/2017 0.82 0.57 - 1.00 mg/dL Final          Passed - Valid encounter within last 12 months    Recent Outpatient Visits           4 weeks ago Gout of right foot, unspecified cause, unspecified chronicity   Primary Care at [facility], Manus Rudd, MD   3 months ago Stress   Primary Care at [facility], Manus Rudd, MD   5 months ago Vitamin C deficiency   [facility] at [facility], [name], NP   1 year ago Gout of right foot, unspecified cause, unspecified chronicity   [facility] at [facility], Dema Severin, PA-C   1 year ago Anterolisthesis   Primary Care at [place], [place], [place]       Future Appointments             Today Neva Seat, Asencion Partridge, MD Primary Care at [place], [facility]

## 2019-08-05 ENCOUNTER — Ambulatory Visit: Payer: Medicare HMO | Admitting: Family Medicine

## 2019-08-08 ENCOUNTER — Telehealth (INDEPENDENT_AMBULATORY_CARE_PROVIDER_SITE_OTHER): Payer: Medicare HMO | Admitting: Registered Nurse

## 2019-08-08 ENCOUNTER — Other Ambulatory Visit: Payer: Self-pay

## 2019-08-08 NOTE — Progress Notes (Signed)
Patient states she will schedule an office visit for tomorrow 08/09/2019

## 2019-08-09 ENCOUNTER — Other Ambulatory Visit: Payer: Self-pay

## 2019-08-09 ENCOUNTER — Encounter: Payer: Self-pay | Admitting: Registered Nurse

## 2019-08-09 ENCOUNTER — Ambulatory Visit (INDEPENDENT_AMBULATORY_CARE_PROVIDER_SITE_OTHER): Payer: Medicare HMO | Admitting: Registered Nurse

## 2019-08-09 VITALS — BP 165/76 | HR 85 | Temp 97.3°F | Resp 17 | Ht 63.0 in

## 2019-08-09 DIAGNOSIS — R062 Wheezing: Secondary | ICD-10-CM

## 2019-08-09 DIAGNOSIS — G4700 Insomnia, unspecified: Secondary | ICD-10-CM

## 2019-08-09 DIAGNOSIS — R05 Cough: Secondary | ICD-10-CM

## 2019-08-09 DIAGNOSIS — M159 Polyosteoarthritis, unspecified: Secondary | ICD-10-CM | POA: Diagnosis not present

## 2019-08-09 DIAGNOSIS — I10 Essential (primary) hypertension: Secondary | ICD-10-CM

## 2019-08-09 DIAGNOSIS — J45909 Unspecified asthma, uncomplicated: Secondary | ICD-10-CM | POA: Diagnosis not present

## 2019-08-09 DIAGNOSIS — M109 Gout, unspecified: Secondary | ICD-10-CM | POA: Diagnosis not present

## 2019-08-09 DIAGNOSIS — Z741 Need for assistance with personal care: Secondary | ICD-10-CM

## 2019-08-09 DIAGNOSIS — F439 Reaction to severe stress, unspecified: Secondary | ICD-10-CM

## 2019-08-09 DIAGNOSIS — Z5181 Encounter for therapeutic drug level monitoring: Secondary | ICD-10-CM

## 2019-08-09 DIAGNOSIS — R053 Chronic cough: Secondary | ICD-10-CM

## 2019-08-09 DIAGNOSIS — M25572 Pain in left ankle and joints of left foot: Secondary | ICD-10-CM | POA: Diagnosis not present

## 2019-08-09 DIAGNOSIS — R12 Heartburn: Secondary | ICD-10-CM

## 2019-08-09 MED ORDER — PREDNISONE 10 MG PO TABS: 10.0000 mg | ORAL_TABLET | Freq: Every day | ORAL | 0 refills | Status: DC

## 2019-08-09 NOTE — Patient Instructions (Signed)
° ° ° °  If you have lab work done today you will be contacted with your lab results within the next 2 weeks.  If you have not heard from us then please contact us. The fastest way to get your results is to register for My Chart. ° ° °IF you received an x-ray today, you will receive an invoice from [facility]. Please contact [facility] at [phone] with questions or concerns regarding your invoice.  ° °IF you received labwork today, you will receive an invoice from [facility]. Please contact [facility] at [phone] with questions or concerns regarding your invoice.  ° °Our billing staff will not be able to assist you with questions regarding bills from these companies. ° °You will be contacted with the lab results as soon as they are available. The fastest way to get your results is to activate your My Chart account. Instructions are located on the last page of this paperwork. If you have not heard from us regarding the results in 2 weeks, please contact this office. °  ° ° ° °

## 2019-08-10 ENCOUNTER — Ambulatory Visit: Payer: Self-pay | Admitting: *Deleted

## 2019-08-10 ENCOUNTER — Other Ambulatory Visit: Payer: Self-pay | Admitting: Family Medicine

## 2019-08-10 DIAGNOSIS — M109 Gout, unspecified: Secondary | ICD-10-CM | POA: Diagnosis not present

## 2019-08-10 DIAGNOSIS — J45909 Unspecified asthma, uncomplicated: Secondary | ICD-10-CM

## 2019-08-10 DIAGNOSIS — Z741 Need for assistance with personal care: Secondary | ICD-10-CM | POA: Diagnosis not present

## 2019-08-10 DIAGNOSIS — Z5181 Encounter for therapeutic drug level monitoring: Secondary | ICD-10-CM | POA: Diagnosis not present

## 2019-08-10 DIAGNOSIS — R062 Wheezing: Secondary | ICD-10-CM

## 2019-08-10 LAB — COMPREHENSIVE METABOLIC PANEL
ALT: 7 IU/L (ref 0–32)
AST: 14 IU/L (ref 0–40)
Albumin/Globulin Ratio: 1.4 (ref 1.2–2.2)
Albumin: 4.2 g/dL (ref 3.6–4.6)
Alkaline Phosphatase: 71 IU/L (ref 39–117)
BUN/Creatinine Ratio: 9 — ABNORMAL LOW (ref 12–28)
BUN: 8 mg/dL (ref 8–27)
Bilirubin Total: <0.2 mg/dL (ref 0.0–1.2)
CO2: 29 mmol/L (ref 20–29)
Calcium: 9.7 mg/dL (ref 8.7–10.3)
Chloride: 100 mmol/L (ref 96–106)
Creatinine, Ser: 0.86 mg/dL (ref 0.57–1.00)
GFR calc Af Amer: 70 mL/min/{1.73_m2} (ref >59)
GFR calc non Af Amer: 61 mL/min/{1.73_m2} (ref >59)
Globulin, Total: 2.9 g/dL (ref 1.5–4.5)
Glucose: 89 mg/dL (ref 65–99)
Potassium: 4.2 mmol/L (ref 3.5–5.2)
Sodium: 142 mmol/L (ref 134–144)
Total Protein: 7.1 g/dL (ref 6.0–8.5)

## 2019-08-10 LAB — CBC
Hematocrit: 38.1 % (ref 34.0–46.6)
Hemoglobin: 12.6 g/dL (ref 11.1–15.9)
MCH: 30.6 pg (ref 26.6–33.0)
MCHC: 33.1 g/dL (ref 31.5–35.7)
MCV: 93 fL (ref 79–97)
Platelets: 350 10*3/uL (ref 150–450)
RBC: 4.12 x10E6/uL (ref 3.77–5.28)
RDW: 13.7 % (ref 11.7–15.4)
WBC: 5.1 10*3/uL (ref 3.4–10.8)

## 2019-08-10 LAB — URIC ACID: Uric Acid: 7.1 mg/dL (ref 3.1–7.9)

## 2019-08-10 MED ORDER — DEXTROMETHORPHAN HBR 10 MG/15ML PO SYRP: 15.0000 mL | ORAL_SOLUTION | Freq: Every day | ORAL | 3 refills | Status: DC

## 2019-08-10 MED ORDER — ALBUTEROL SULFATE (2.5 MG/3ML) 0.083% IN NEBU: 2.5000 mg | INHALATION_SOLUTION | Freq: Two times a day (BID) | RESPIRATORY_TRACT | 1 refills | Status: DC | PRN

## 2019-08-10 MED ORDER — ALPRAZOLAM 0.25 MG PO TABS: 0.1250 mg | ORAL_TABLET | Freq: Every day | ORAL | 0 refills | Status: DC

## 2019-08-10 MED ORDER — BENZONATATE 100 MG PO CAPS: 100.0000 mg | ORAL_CAPSULE | Freq: Three times a day (TID) | ORAL | 1 refills | Status: DC | PRN

## 2019-08-10 MED ORDER — ALBUTEROL SULFATE HFA 108 (90 BASE) MCG/ACT IN AERS: INHALATION_SPRAY | RESPIRATORY_TRACT | 1 refills | Status: DC

## 2019-08-10 MED ORDER — COLCHICINE 0.6 MG PO TABS: 0.6000 mg | ORAL_TABLET | Freq: Two times a day (BID) | ORAL | 2 refills | Status: DC

## 2019-08-10 MED ORDER — PANTOPRAZOLE SODIUM 40 MG PO TBEC: 40.0000 mg | DELAYED_RELEASE_TABLET | Freq: Every day | ORAL | 1 refills | Status: DC

## 2019-08-10 MED ORDER — HYDROCHLOROTHIAZIDE 25 MG PO TABS: 25.0000 mg | ORAL_TABLET | Freq: Every day | ORAL | 1 refills | Status: DC

## 2019-08-10 MED ORDER — TRAMADOL HCL 50 MG PO TABS: 50.0000 mg | ORAL_TABLET | Freq: Three times a day (TID) | ORAL | 0 refills | Status: AC | PRN

## 2019-08-10 MED ORDER — FLUTICASONE PROPIONATE 50 MCG/ACT NA SUSP: 2.0000 | Freq: Every day | NASAL | 0 refills | Status: DC

## 2019-08-10 MED ORDER — ALLOPURINOL 100 MG PO TABS: 100.0000 mg | ORAL_TABLET | Freq: Every day | ORAL | 1 refills | Status: DC

## 2019-08-10 MED ORDER — CITALOPRAM HYDROBROMIDE 10 MG PO TABS: 10.0000 mg | ORAL_TABLET | Freq: Every day | ORAL | 1 refills | Status: DC

## 2019-08-10 NOTE — Progress Notes (Signed)
Dr. Creta Levin -  I don't see any concerns here, she was unable to leave urine but will return when possible She is scheduled to follow up with you in 3 mo  Thank you  [name], NP

## 2019-08-10 NOTE — Progress Notes (Signed)
Established Patient Office Visit  Subjective:  Patient ID: Amy Gallagher, female    DOB: [birth year]  Age: 86 y.o. MRN: [id number]  CC:  Chief Complaint  Patient presents with  . Knee Pain    left knee pain and some welling in the foot . patient stated she had xray from the ER nothing on the xray.  . Medication Refill    HPI Amy Gallagher presents for follow up and labs.  Overall, feels she is doing well. Here today with two family members.  Notes that she is having acute L ankle pain, however, upon taking history she notes that she had stopped taking her allopurinol for some time - she states one week, family members endorse a few months. Red, swollen, tender ankle, particularly bony tenderness.  Some L knee pain as well - history of suspected degenerative changes, this pain is mild in comparison to ankle. Has tried volataren tabs and gel, celebrex, and OTCs without relief.   Otherwise feeling fine. No complaints. Mixed medication compliance, but no new concerns since last visit  Will draw labs today  Past Medical History:  Diagnosis Date  . Arthritis   . Bronchitis   . Gout     Past Surgical History:  Procedure Laterality Date  . SALPINGECTOMY     right    No family history on file.  Social History   Socioeconomic History  . Marital status: Widowed    Spouse name: Not on file  . Number of children: Not on file  . Years of education: Not on file  . Highest education level: Not on file  Occupational History  . Not on file  Tobacco Use  . Smoking status: Former Smoker    Types: Cigarettes    Quit date: 02/26/1997    Years since quitting: 22.4  . Smokeless tobacco: Never Used  Substance and Sexual Activity  . Alcohol use: No  . Drug use: No  . Sexual activity: Not on file  Other Topics Concern  . Not on file  Social History Narrative   Patient cares for her mentally 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    Walks with  a rolling walker.    Social Determinants of Health   Financial Resource Strain:   . Difficulty of Paying Living Expenses: Not on file  Food Insecurity:   . Worried About Programme researcher, broadcasting/film/video in the Last Year: Not on file  . Ran Out of Food in the Last Year: Not on file  Transportation Needs:   . Lack of Transportation (Medical): Not on file  . Lack of Transportation (Non-Medical): Not on file  Physical Activity:   . Days of Exercise per Week: Not on file  . Minutes of Exercise per Session: Not on file  Stress:   . Feeling of Stress : Not on file  Social Connections:   . Frequency of Communication with Friends and Family: Not on file  . Frequency of Social Gatherings with Friends and Family: Not on file  . Attends Religious Services: Not on file  . Active Member of Clubs or Organizations: Not on file  . Attends Banker Meetings: Not on file  . Marital Status: Not on file  Intimate Partner Violence:   . Fear of Current or Ex-Partner: Not on file  . Emotionally Abused: Not on file  . Physically Abused: Not on file  . Sexually Abused: Not on file  Outpatient Medications Prior to Visit  Medication Sig Dispense Refill  . celecoxib (CELEBREX) 50 MG capsule TAKE 1 CAPSULE BY MOUTH TWICE A DAY 60 capsule 0  . diclofenac (VOLTAREN) 75 MG EC tablet Take 1 tablet (75 mg total) by mouth 2 (two) times daily. 60 tablet 3  . diclofenac sodium (VOLTAREN) 1 % GEL APPLY 2-4GMS 2-3 TIMES DAILY AS NEEDED 400 g 3  . fluticasone (FLOVENT HFA) 44 MCG/ACT inhaler Inhale 2 puffs into the lungs 2 (two) times daily. 1 Inhaler 5  . Misc. Devices (COMMODE) MISC Please dispense one rail for commode. ICD10 Z74.09 1 each 0  . Misc. Devices [facility](WHEELCHAIR) MISC 1 Device by Does not apply route daily. 1 each 0  . Multiple Vitamin (MULTIVITAMIN WITH MINERALS) TABS tablet Take 1 tablet by mouth daily.    Marland Kitchen. trolamine salicylate (ASPERCREME/ALOE) 10 % cream Apply 1 application topically as needed for  muscle pain. 85 g 0  . vitamin C (ASCORBIC ACID) 500 MG tablet Take 1 tablet (500 mg total) by mouth daily. 90 tablet 3  . albuterol (PROVENTIL) (2.5 MG/3ML) 0.083% nebulizer solution Take 3 mLs (2.5 mg total) by nebulization 2 (two) times daily as needed for wheezing or shortness of breath. 150 mL 1  . albuterol (VENTOLIN HFA) 108 (90 Base) MCG/ACT inhaler INHALE 2 PUFFS INTO THE LUNGS EVERY 4 HOURS AS NEEDED FOR WHEEZING OR SHORTNESS OF BREATH NOV 18 g 1  . allopurinol (ZYLOPRIM) 100 MG tablet TAKE 1 TABLET BY MOUTH EVERY DAY 90 tablet 1  . ALPRAZolam (XANAX) 0.25 MG tablet TAKE 0.5 TABLETS (0.125 MG TOTAL) BY MOUTH DAILY. 15 tablet 0  . benzonatate (TESSALON) 100 MG capsule Take 1-2 capsules (100-200 mg total) by mouth 3 (three) times daily as needed for cough. 40 capsule 1  . citalopram (CELEXA) 10 MG tablet TAKE 1 TABLET BY MOUTH EVERY DAY 90 tablet 1  . colchicine 0.6 MG tablet Take 1 tablet (0.6 mg total) by mouth 2 (two) times daily. Take for gout attack. 60 tablet 2  . Dextromethorphan HBr 10 MG/15ML SYRP Take 15 mLs (10 mg total) by mouth daily. 120 mL 3  . fluticasone (FLONASE) 50 MCG/ACT nasal spray SPRAY 2 SPRAYS INTO EACH NOSTRIL EVERY DAY 16 mL 0  . hydrochlorothiazide (HYDRODIURIL) 25 MG tablet Take 1 tablet (25 mg total) by mouth daily. 90 tablet 1  . pantoprazole (PROTONIX) 40 MG tablet TAKE 1 TABLET BY MOUTH EVERY DAY 90 tablet 0   No facility-administered medications prior to visit.    Allergies  Allergen Reactions  . Aspirin Nausea And Vomiting  . Penicillins Swelling    ROS Review of Systems  Constitutional: Negative.   HENT: Negative.   Eyes: Negative.   Respiratory: Negative.   Cardiovascular: Negative.   Gastrointestinal: Negative.   Endocrine: Negative.   Genitourinary: Negative.   Musculoskeletal: Positive for arthralgias (L ankle, L knee, gouty). Negative for back pain, gait problem, joint swelling, myalgias, neck pain and neck stiffness.  Skin: Negative.    Allergic/Immunologic: Negative.   Neurological: Negative.   Hematological: Negative.   Psychiatric/Behavioral: Negative.   All other systems reviewed and are negative.     Objective:    Physical Exam  Constitutional: She is oriented to person, place, and time. She appears well-developed and well-nourished. No distress.  HENT:  Head: Normocephalic and atraumatic.  Eyes: Pupils are equal, round, and reactive to light.  Cardiovascular: Normal rate and regular rhythm.  Pulmonary/Chest: Effort normal. No respiratory distress.  Musculoskeletal:        General: Tenderness (L ankle medial and lateral malleolus, L knee medial and lateral joint line) and edema (mild bilat) present.     Cervical back: Normal range of motion.  Neurological: She is alert and oriented to person, place, and time.  Skin: Skin is warm and dry. No rash noted. She is not diaphoretic. No erythema. No pallor.  Psychiatric: She has a normal mood and affect. Her behavior is normal. Judgment and thought content normal.  Nursing note and vitals reviewed.   BP (!) 165/76   Pulse 85   Temp (!) 97.3 F (36.3 C) (Temporal)   Resp 17   Ht 5\' 3"  (1.6 m)   SpO2 100%   BMI 33.83 kg/m  Wt Readings from Last 3 Encounters:  03/04/19 191 lb (86.6 kg)  04/27/17 192 lb (87.1 kg)  09/24/16 183 lb (83 kg)     There are no preventive care reminders to display for this patient.  There are no preventive care reminders to display for this patient.  Lab Results  Component Value Date   TSH 1.40 08/03/2009   Lab Results  Component Value Date   WBC 5.1 08/09/2019   HGB 12.6 08/09/2019   HCT 38.1 08/09/2019   MCV 93 08/09/2019   PLT 350 08/09/2019   Lab Results  Component Value Date   NA 142 08/09/2019   K 4.2 08/09/2019   CO2 29 08/09/2019   GLUCOSE 89 08/09/2019   BUN 8 08/09/2019   CREATININE 0.86 08/09/2019   BILITOT <0.2 08/09/2019   ALKPHOS 71 08/09/2019   AST 14 08/09/2019   ALT 7 08/09/2019   PROT 7.1  08/09/2019   ALBUMIN 4.2 08/09/2019   CALCIUM 9.7 08/09/2019   Lab Results  Component Value Date   CHOL 166 11/08/2015   Lab Results  Component Value Date   HDL 73 11/08/2015   Lab Results  Component Value Date   LDLCALC 80 11/08/2015   Lab Results  Component Value Date   TRIG 63 11/08/2015   Lab Results  Component Value Date   CHOLHDL 2.3 11/08/2015   Lab Results  Component Value Date   HGBA1C 5.6 02/24/2013      Assessment & Plan:   Problem List Items Addressed This Visit      Other   Gout   Relevant Medications   allopurinol (ZYLOPRIM) 100 MG tablet   colchicine 0.6 MG tablet   traMADol (ULTRAM) 50 MG tablet   Other Relevant Orders   Uric Acid (Completed)    Other Visit Diagnoses    Acute left ankle pain    -  Primary   Relevant Medications   predniSONE (DELTASONE) 10 MG tablet   traMADol (ULTRAM) 50 MG tablet   Encounter for medication monitoring       Relevant Orders   Urinalysis   Comprehensive metabolic panel (Completed)   CBC (Completed)   Requires assistance with all daily activities       Relevant Orders   Urinalysis   Comprehensive metabolic panel (Completed)   CBC (Completed)   Wheezing       Relevant Medications   albuterol (PROVENTIL) (2.5 MG/3ML) 0.083% nebulizer solution   albuterol (VENTOLIN HFA) 108 (90 Base) MCG/ACT inhaler   Persistent asthma without complication, unspecified asthma severity       Relevant Medications   predniSONE (DELTASONE) 10 MG tablet   albuterol (PROVENTIL) (2.5 MG/3ML) 0.083% nebulizer solution   albuterol (VENTOLIN HFA) 108 (  90 Base) MCG/ACT inhaler   benzonatate (TESSALON) 100 MG capsule   fluticasone (FLONASE) 50 MCG/ACT nasal spray   Insomnia, unspecified type       Relevant Medications   ALPRAZolam (XANAX) 0.25 MG tablet   Osteoarthritis of multiple joints, unspecified osteoarthritis type       Relevant Medications   predniSONE (DELTASONE) 10 MG tablet   allopurinol (ZYLOPRIM) 100 MG tablet    colchicine 0.6 MG tablet   traMADol (ULTRAM) 50 MG tablet   Stress       Relevant Medications   citalopram (CELEXA) 10 MG tablet   Essential hypertension       Relevant Medications   hydrochlorothiazide (HYDRODIURIL) 25 MG tablet   Heartburn       Relevant Medications   pantoprazole (PROTONIX) 40 MG tablet   Chronic cough       Relevant Medications   Dextromethorphan HBr 10 MG/15ML SYRP      Meds ordered this encounter  Medications  . predniSONE (DELTASONE) 10 MG tablet    Sig: Take 1 tablet (10 mg total) by mouth daily with breakfast.    Dispense:  5 tablet    Refill:  0    Order Specific Question:   Supervising Provider    Answer:   Collie Siad A [id number]  . albuterol (PROVENTIL) (2.5 MG/3ML) 0.083% nebulizer solution    Sig: Take 3 mLs (2.5 mg total) by nebulization 2 (two) times daily as needed for wheezing or shortness of breath.    Dispense:  150 mL    Refill:  1    Order Specific Question:   Supervising Provider    Answer:   Collie Siad A [id number]  . albuterol (VENTOLIN HFA) 108 (90 Base) MCG/ACT inhaler    Sig: INHALE 2 PUFFS INTO THE LUNGS EVERY 4 HOURS AS NEEDED FOR WHEEZING OR SHORTNESS OF BREATH NOV    Dispense:  18 g    Refill:  1    DX Code Needed  .    Order Specific Question:   Supervising Provider    Answer:   Doristine Bosworth [id number]  . allopurinol (ZYLOPRIM) 100 MG tablet    Sig: Take 1 tablet (100 mg total) by mouth daily.    Dispense:  90 tablet    Refill:  1    Order Specific Question:   Supervising Provider    Answer:   Collie Siad A [id number]  . ALPRAZolam (XANAX) 0.25 MG tablet    Sig: Take 0.5 tablets (0.125 mg total) by mouth daily.    Dispense:  15 tablet    Refill:  0    Not to exceed 5 additional fills before 10/08/2019 DX Code Needed  .    Order Specific Question:   Supervising Provider    Answer:   Doristine Bosworth [id number]  . benzonatate (TESSALON) 100 MG capsule    Sig: Take 1-2 capsules (100-200 mg total) by mouth 3  (three) times daily as needed for cough.    Dispense:  40 capsule    Refill:  1    Order Specific Question:   Supervising Provider    Answer:   Collie Siad A [id number]  . citalopram (CELEXA) 10 MG tablet    Sig: Take 1 tablet (10 mg total) by mouth daily.    Dispense:  90 tablet    Refill:  1    Order Specific Question:   Supervising Provider    Answer:   Collie Siad A [id number]  .  colchicine 0.6 MG tablet    Sig: Take 1 tablet (0.6 mg total) by mouth 2 (two) times daily. Take for gout attack.    Dispense:  60 tablet    Refill:  2    Please store on file. Changed frequency    Order Specific Question:   Supervising Provider    Answer:   Collie Siad A [id number]  . Dextromethorphan HBr 10 MG/15ML SYRP    Sig: Take 15 mLs (10 mg total) by mouth daily.    Dispense:  120 mL    Refill:  3    Order Specific Question:   Supervising Provider    Answer:   Collie Siad A [id number]  . fluticasone (FLONASE) 50 MCG/ACT nasal spray    Sig: Place 2 sprays into both nostrils daily.    Dispense:  16 mL    Refill:  0    Order Specific Question:   Supervising Provider    Answer:   Collie Siad A [id number]  . hydrochlorothiazide (HYDRODIURIL) 25 MG tablet    Sig: Take 1 tablet (25 mg total) by mouth daily.    Dispense:  90 tablet    Refill:  1    Order Specific Question:   Supervising Provider    Answer:   Collie Siad A [id number]  . pantoprazole (PROTONIX) 40 MG tablet    Sig: Take 1 tablet (40 mg total) by mouth daily.    Dispense:  90 tablet    Refill:  1    Order Specific Question:   Supervising Provider    Answer:   Collie Siad A [id number]  . traMADol (ULTRAM) 50 MG tablet    Sig: Take 1 tablet (50 mg total) by mouth every 8 (eight) hours as needed for up to 5 days.    Dispense:  15 tablet    Refill:  0    Order Specific Question:   Supervising Provider    Answer:   Doristine Bosworth [id number]    Follow-up: No follow-ups on file.   PLAN  She will have labs drawn,  we will follow up as warranted  She will follow up with PCP Dr. Creta Levin in 3 months  She is requesting all of her medications be refilled so that they arrive at her home on the same day.  We will make sure she resumes allopurinol daily for prevention of gout.  Patient encouraged to call clinic with any questions, comments, or concerns.  I spent 22 minutes with this patient, more than 50% of which was spent educating and counseling.  [name], NP

## 2019-08-10 NOTE — Telephone Encounter (Signed)
I was there yesterday ([facility] [address]) and they want me to bring in a urine sample.   (She saw [name]).  I was not able to urinate yesterday so they told me I could bring the sample in today.   I was able to given a sample about 12:30 PM.   Is it still good to bring in?   I didn't know how old it could be and still be a good sample.  I let her know I would have someone give her a call with further instructions.  I sent my note to [facility] [address] high priority.     Reason for Disposition . [1] Caller requesting NON-URGENT health information AND [2] PCP's office is the best resource    Was instructed to bring in a urine sample today.   She has it.  Answer Assessment - Initial Assessment Questions 1. REASON FOR CALL or QUESTION: "What is your reason for calling today?" or "How can I best help you?" or "What question do you have that I can help answer?"     See note.  Protocols used: INFORMATION ONLY CALL-A-AH

## 2019-08-10 NOTE — Telephone Encounter (Signed)
Called patient and it was no answer left a voicemail for her to return the call at the office.

## 2019-08-11 LAB — URINALYSIS
Bilirubin, UA: NEGATIVE
Glucose, UA: NEGATIVE
Ketones, UA: NEGATIVE
Nitrite, UA: NEGATIVE
Protein,UA: NEGATIVE
RBC, UA: NEGATIVE
Specific Gravity, UA: 1.01 (ref 1.005–1.030)
Urobilinogen, Ur: 0.2 mg/dL (ref 0.2–1.0)
pH, UA: 7.5 (ref 5.0–7.5)

## 2019-08-16 ENCOUNTER — Other Ambulatory Visit: Payer: Self-pay | Admitting: Registered Nurse

## 2019-08-16 ENCOUNTER — Telehealth: Payer: Self-pay | Admitting: Registered Nurse

## 2019-08-16 DIAGNOSIS — M25572 Pain in left ankle and joints of left foot: Secondary | ICD-10-CM

## 2019-08-16 NOTE — Telephone Encounter (Signed)
Lvm for patient to call back regarding the prescription she requested. To tell her about the changes you wanted to make .

## 2019-08-16 NOTE — Telephone Encounter (Signed)
Pt requesting refill for Prednisone Stated it has helped but she needs another round.  Please advise.

## 2019-08-16 NOTE — Telephone Encounter (Signed)
Lvm for the patient to call back about the medication she requested.

## 2019-08-18 NOTE — Telephone Encounter (Signed)
Patient was last seen on 08/09/19 and given prednisone and requesting another rx please advise

## 2019-08-18 NOTE — Telephone Encounter (Signed)
Patient called back please call patient about these meds she requested .

## 2019-08-19 ENCOUNTER — Other Ambulatory Visit: Payer: Self-pay | Admitting: Registered Nurse

## 2019-08-19 DIAGNOSIS — M25572 Pain in left ankle and joints of left foot: Secondary | ICD-10-CM

## 2019-08-19 MED ORDER — PREDNISONE 10 MG PO TABS: 10.0000 mg | ORAL_TABLET | Freq: Every day | ORAL | 0 refills | Status: DC

## 2019-08-19 NOTE — Telephone Encounter (Signed)
Will send over one short course - however, this is not a medication that I want this patient taking too frequently given the blood sugar and blood pressure effects it can have.  [name], NP

## 2019-08-19 NOTE — Progress Notes (Signed)
Giving a second short course of low dose prednisone for joint pain.  [name], NP

## 2019-08-22 NOTE — Telephone Encounter (Signed)
Left a msg on machine that new rx was sent to the pharm and if no better after this course then to schedule an appt. Patient was also informed that this med os not to be taking frequently do to it has an effect on blood sugar and blood pressure.

## 2019-08-29 ENCOUNTER — Telehealth: Payer: Self-pay | Admitting: *Deleted

## 2019-08-29 NOTE — Telephone Encounter (Signed)
Schedule AWV.  

## 2019-09-01 ENCOUNTER — Other Ambulatory Visit: Payer: Self-pay | Admitting: Family Medicine

## 2019-09-01 DIAGNOSIS — J45909 Unspecified asthma, uncomplicated: Secondary | ICD-10-CM

## 2019-09-21 ENCOUNTER — Other Ambulatory Visit: Payer: Self-pay

## 2019-09-21 ENCOUNTER — Encounter: Payer: Self-pay | Admitting: Family Medicine

## 2019-09-21 ENCOUNTER — Telehealth: Payer: Medicare HMO | Admitting: Family Medicine

## 2019-09-21 MED ORDER — COLCHICINE 0.6 MG PO TABS: 0.6000 mg | ORAL_TABLET | Freq: Two times a day (BID) | ORAL | 2 refills | Status: DC

## 2019-09-21 MED ORDER — ALLOPURINOL 100 MG PO TABS: 100.0000 mg | ORAL_TABLET | Freq: Every day | ORAL | 1 refills | Status: DC

## 2019-09-21 MED ORDER — DICLOFENAC SODIUM 75 MG PO TBEC: 75.0000 mg | DELAYED_RELEASE_TABLET | Freq: Two times a day (BID) | ORAL | 3 refills | Status: DC

## 2019-09-21 NOTE — Progress Notes (Signed)
Cc: f/u request for referral.  Left knee/hip pain.  Pain clears up and then comes back. Talked with dr Creta Levin about it ? Gout.  Pt feeling down due to loss of son x 3 weeks ago.  Pt requesting wheelchair and shower commode.  Wants refill on dextromorphan.  No travel outside the [place] or [place] in the past 3 weeks.

## 2019-09-21 NOTE — Patient Instructions (Signed)
° ° ° °  If you have lab work done today you will be contacted with your lab results within the next 2 weeks.  If you have not heard from us then please contact us. The fastest way to get your results is to register for My Chart. ° ° °IF you received an x-ray today, you will receive an invoice from [facility]. Please contact [facility] at [phone] with questions or concerns regarding your invoice.  ° °IF you received labwork today, you will receive an invoice from [facility]. Please contact [facility] at [phone] with questions or concerns regarding your invoice.  ° °Our billing staff will not be able to assist you with questions regarding bills from these companies. ° °You will be contacted with the lab results as soon as they are available. The fastest way to get your results is to activate your My Chart account. Instructions are located on the last page of this paperwork. If you have not heard from us regarding the results in 2 weeks, please contact this office. °  ° ° ° °

## 2019-09-22 ENCOUNTER — Telehealth: Payer: Self-pay | Admitting: Family Medicine

## 2019-09-22 NOTE — Telephone Encounter (Signed)
Doing better, she just has questions about her medication that was prescriped, and about therapy   Please advise

## 2019-09-23 ENCOUNTER — Other Ambulatory Visit: Payer: Self-pay | Admitting: Family Medicine

## 2019-09-23 DIAGNOSIS — R053 Chronic cough: Secondary | ICD-10-CM

## 2019-09-23 DIAGNOSIS — R05 Cough: Secondary | ICD-10-CM

## 2019-09-23 MED ORDER — DEXTROMETHORPHAN HBR 10 MG/15ML PO SYRP: 15.0000 mL | ORAL_SOLUTION | Freq: Every day | ORAL | 3 refills | Status: AC

## 2019-09-23 NOTE — Telephone Encounter (Signed)
Spoke with pt advised she tried contacting pharmacy ([facility] [address]) with no luck as no one would p/u in the pharmacy.  Advised rx originally sent to [facility] with 3 refills.  Pt advises cough syrup needs to be sent to [facility] [address].  Advised Dr Eldred Manges and she has sent in cough syrup (dextromethorphan) to local [facility] [address]. Will sent rx for wheelchair and shower commode to [facility] per pt at fax: [phone].   Per pt no one from our office called to let her know her cough med was at [facility] with 3 refills.  Notified pt rx [facility] [address] and wait and hour or so before going to p/u.  Pt agreeable.

## 2019-09-23 NOTE — Telephone Encounter (Signed)
I have call pt and she wanted to know the status of her medication due to her cough and bronchitis. Pt is doing better. I have informed her that her medication is at the pharmacy waiting for her. She can call them to get the refills.   Pt is requesting a bedside commode and wheelchair DME.   I have pended the orders for the DME.  Please advise if they are still appropriate the old orders where from 2018.

## 2019-09-28 ENCOUNTER — Telehealth: Payer: Self-pay | Admitting: Family Medicine

## 2019-09-28 NOTE — Telephone Encounter (Signed)
Please Advise

## 2019-09-28 NOTE — Telephone Encounter (Signed)
Leigh called in regards to paperwork that was faxed over for a new Rx for incontinence supplies. Pt is now completely out please advise  Best contact: [phone]

## 2019-09-28 NOTE — Progress Notes (Signed)
Patient did not answer the phone on the call back. Will place referral for Rheumatology due to her chronic knee pain since she is not getting much relief.  Longstanding arthritis. Both arthritis and gout arthritis.

## 2019-09-29 NOTE — Telephone Encounter (Signed)
Noted.  In provider red folder for signature and provider has been made aware.

## 2019-09-30 NOTE — Telephone Encounter (Signed)
Supplies are on the way to pt Per Leigh from [employer]. New form needed, pt is wearing larger size. PCP not available. NP Kateri Plummer signed orders.

## 2019-09-30 NOTE — Telephone Encounter (Signed)
Arty Baumgartner called again regarding the 2 forms that were faxed in. [phone] Please advise.

## 2019-10-03 ENCOUNTER — Other Ambulatory Visit: Payer: Self-pay | Admitting: Family Medicine

## 2019-10-03 DIAGNOSIS — J45909 Unspecified asthma, uncomplicated: Secondary | ICD-10-CM

## 2019-10-05 ENCOUNTER — Other Ambulatory Visit: Payer: Self-pay | Admitting: Family Medicine

## 2019-10-05 MED ORDER — COLCHICINE 0.6 MG PO CAPS: 0.6000 mg | ORAL_CAPSULE | Freq: Every day | ORAL | 1 refills | Status: DC

## 2019-10-06 ENCOUNTER — Telehealth: Payer: Self-pay

## 2019-10-06 ENCOUNTER — Telehealth: Payer: Self-pay | Admitting: Family Medicine

## 2019-10-06 DIAGNOSIS — Z7409 Other reduced mobility: Secondary | ICD-10-CM

## 2019-10-06 DIAGNOSIS — Z9181 History of falling: Secondary | ICD-10-CM

## 2019-10-06 DIAGNOSIS — M159 Polyosteoarthritis, unspecified: Secondary | ICD-10-CM

## 2019-10-06 DIAGNOSIS — Z741 Need for assistance with personal care: Secondary | ICD-10-CM

## 2019-10-06 NOTE — Telephone Encounter (Signed)
Message to doctor and asst

## 2019-10-06 NOTE — Telephone Encounter (Signed)
[facility] called regarding the wheelchair order. They said that the notes sent in with the order didn't have why she needed it just that pt is requesting it. For them to approve it they need why the pt needs it. They said that the order will be declined for now and when you send in the correct notes they will put it in and send it. Fax: [phone] Phone: [phone] 

## 2019-10-06 NOTE — Telephone Encounter (Signed)
[facility] called regarding the wheelchair order. They said that the notes sent in with the order didn't have why she needed it just that pt is requesting it. For them to approve it they need why the pt needs it. They said that the order will be declined for now and when you send in the correct notes they will put it in and send it. Fax: [phone] Phone: [phone]

## 2019-10-07 ENCOUNTER — Other Ambulatory Visit: Payer: Self-pay | Admitting: Registered Nurse

## 2019-10-07 DIAGNOSIS — R12 Heartburn: Secondary | ICD-10-CM

## 2019-10-10 ENCOUNTER — Telehealth: Payer: Self-pay | Admitting: Family Medicine

## 2019-10-10 NOTE — Telephone Encounter (Signed)
Letter of necessity written and fax to [facility] at [phone].

## 2019-10-10 NOTE — Telephone Encounter (Signed)
Error

## 2019-10-14 ENCOUNTER — Other Ambulatory Visit: Payer: Self-pay | Admitting: Registered Nurse

## 2019-10-14 DIAGNOSIS — J45909 Unspecified asthma, uncomplicated: Secondary | ICD-10-CM

## 2019-10-14 DIAGNOSIS — R062 Wheezing: Secondary | ICD-10-CM

## 2019-10-23 ENCOUNTER — Other Ambulatory Visit: Payer: Self-pay | Admitting: Registered Nurse

## 2019-10-23 DIAGNOSIS — J45909 Unspecified asthma, uncomplicated: Secondary | ICD-10-CM

## 2019-10-23 DIAGNOSIS — R062 Wheezing: Secondary | ICD-10-CM

## 2019-11-07 ENCOUNTER — Telehealth (INDEPENDENT_AMBULATORY_CARE_PROVIDER_SITE_OTHER): Payer: Medicare HMO | Admitting: Family Medicine

## 2019-11-07 ENCOUNTER — Encounter: Payer: Self-pay | Admitting: Family Medicine

## 2019-11-07 ENCOUNTER — Other Ambulatory Visit: Payer: Self-pay

## 2019-11-07 ENCOUNTER — Telehealth: Payer: Self-pay | Admitting: Family Medicine

## 2019-11-07 DIAGNOSIS — Z7409 Other reduced mobility: Secondary | ICD-10-CM

## 2019-11-07 DIAGNOSIS — Z741 Need for assistance with personal care: Secondary | ICD-10-CM | POA: Diagnosis not present

## 2019-11-07 DIAGNOSIS — M159 Polyosteoarthritis, unspecified: Secondary | ICD-10-CM

## 2019-11-07 DIAGNOSIS — Z9181 History of falling: Secondary | ICD-10-CM

## 2019-11-07 NOTE — Patient Instructions (Signed)

## 2019-11-07 NOTE — Telephone Encounter (Signed)
I have called pt to inform her that her bed side commode was sent in on 06/03/19 and the wheelchair was denied due to not being able to see the patient as a face to face. There was no answer so I left a message to call back.   I spoke to miss Amy Gallagher from [facility] [employer] at [phone].    Informed provider and she stated that she has informed pt on tele-med visit on 11/07/19 this information and the social worker will assist with the additional needs that are being requestioned.

## 2019-11-07 NOTE — Telephone Encounter (Signed)
This was to triage for her appointment has already been taken care of

## 2019-11-07 NOTE — Telephone Encounter (Signed)
PT called stating she received a call from our office and was just giving [place] a call back. Please advise.

## 2019-11-07 NOTE — Progress Notes (Addendum)
Telemedicine Encounter- SOAP NOTE Established Patient  This telephone encounter was conducted with the patient's (or proxy's) verbal consent via audio telecommunications: yes/no: Yes Patient was instructed to have this encounter in a suitably private space; and to only have persons present to whom they give permission to participate. In addition, patient identity was confirmed by use of name plus two identifiers (DOB and address).  I discussed the limitations, risks, security and privacy concerns of performing an evaluation and management service by telephone and the availability of in person appointments. I also discussed with the patient that there may be a patient responsible charge related to this service. The patient expressed understanding and agreed to proceed.  I spent a total of TIME; 0 MIN TO 60 MIN: 15 minutes talking with the patient or their proxy.   Chief Complaint  Patient presents with  . DME request    requesitng for wheelchair & and bed side comode   . Medical Management of Chronic Issues    f/u and stress level is better.     Subjective   Amy Gallagher is a 86 y.o. established patient. Telephone visit today for  Limited Mobility Fall Risk Patient is requesting a bedside commode for her gout attacks when she cannot work She also is requesting a wheel chair order because her current wheelchair has rusted and cannot be opened Last gout attack was 9 months ago  She reports that her mood is getting better She states that she has good days and bad days Since the death of her son she feels like she getting stronger Depression screen [facility] 2/9 11/07/2019 09/21/2019 08/09/2019 07/06/2019 04/11/2019  Decreased Interest 0 1 0 0 0  Down, Depressed, Hopeless 0 1 0 0 0  PHQ - 2 Score 0 2 0 0 0  Altered sleeping - 0 - - -  Tired, decreased energy - 0 - - -  Change in appetite - 0 - - -  Feeling bad or failure about yourself  - 0 - - -  Trouble concentrating - 0 - - -    Moving slowly or fidgety/restless - 0 - - -  Suicidal thoughts - 0 - - -  PHQ-9 Score - 2 - - -  Difficult doing work/chores - Not difficult at all - - -    Osteoarthritis She reports that she has left hip stiffness and knee pain She is using a joint rub which helps She is using her walker but would like some physical therapy. She has gotten stiff from the lack of activity in the house. She would like to work with a physical therapist to increase her strength.   Patient Active Problem List   Diagnosis Date Noted  . Urinary urgency 06/10/2016  . Environmental allergies 08/14/2011  . Gout 09/10/2006  . DEPRESSION, MAJOR, RECURRENT 09/10/2006  . Former smoker 09/10/2006  . HYPERTENSION, BENIGN SYSTEMIC 09/10/2006  . Mild intermittent asthma 09/10/2006  . REFLUX ESOPHAGITIS 09/10/2006  . Arthritis 09/10/2006    Past Medical History:  Diagnosis Date  . Arthritis   . Bronchitis   . Gout     Current Outpatient Medications  Medication Sig Dispense Refill  . albuterol (PROVENTIL) (2.5 MG/3ML) 0.083% nebulizer solution Take 3 mLs (2.5 mg total) by nebulization 2 (two) times daily as needed for wheezing or shortness of breath. 150 mL 1  . albuterol (VENTOLIN HFA) 108 (90 Base) MCG/ACT inhaler INHALE 2 PUFFS INTO THE LUNGS EVERY 4 HOURS AS NEEDED FOR  WHEEZING OR SHORTNESS OF BREATH NOV 8 g 1  . allopurinol (ZYLOPRIM) 100 MG tablet Take 1 tablet (100 mg total) by mouth daily. 90 tablet 1  . ALPRAZolam (XANAX) 0.25 MG tablet Take 0.5 tablets (0.125 mg total) by mouth daily. 15 tablet 0  . benzonatate (TESSALON) 100 MG capsule Take 1-2 capsules (100-200 mg total) by mouth 3 (three) times daily as needed for cough. 40 capsule 1  . citalopram (CELEXA) 10 MG tablet Take 1 tablet (10 mg total) by mouth daily. 90 tablet 1  . Colchicine (MITIGARE) 0.6 MG CAPS Take 0.6 mg by mouth daily. Dispense as MITIGARE 90 capsule 1  . Dextromethorphan HBr 10 MG/15ML SYRP Take 15 mLs (10 mg total) by mouth  daily. 120 mL 3  . fluticasone (FLONASE) 50 MCG/ACT nasal spray SPRAY 2 SPRAYS INTO EACH NOSTRIL EVERY DAY 16 mL 1  . fluticasone (FLOVENT HFA) 44 MCG/ACT inhaler Inhale 2 puffs into the lungs 2 (two) times daily. 1 Inhaler 5  . hydrochlorothiazide (HYDRODIURIL) 25 MG tablet Take 1 tablet (25 mg total) by mouth daily. 90 tablet 1  . Multiple Vitamin (MULTIVITAMIN WITH MINERALS) TABS tablet Take 1 tablet by mouth daily.    . pantoprazole (PROTONIX) 40 MG tablet TAKE 1 TABLET BY MOUTH EVERY DAY 90 tablet 1  . trolamine salicylate (ASPERCREME/ALOE) 10 % cream Apply 1 application topically as needed for muscle pain. 85 g 0  . vitamin C (ASCORBIC ACID) 500 MG tablet Take 1 tablet (500 mg total) by mouth daily. 90 tablet 3  . Misc. Devices (COMMODE) MISC Please dispense one rail for commode. ICD10 Z74.09 (Patient not taking: Reported on 11/07/2019) 1 each 0  . Misc. Devices [facility]) MISC 1 Device by Does not apply route daily. (Patient not taking: Reported on 09/21/2019) 1 each 0  . predniSONE (DELTASONE) 10 MG tablet Take 1 tablet (10 mg total) by mouth daily with breakfast. (Patient not taking: Reported on 11/07/2019) 5 tablet 0   No current facility-administered medications for this visit.    Allergies  Allergen Reactions  . Aspirin Nausea And Vomiting  . Penicillins Swelling    Social History   Socioeconomic History  . Marital status: Widowed    Spouse name: Not on file  . Number of children: Not on file  . Years of education: Not on file  . Highest education level: Not on file  Occupational History  . Not on file  Tobacco Use  . Smoking status: Former Smoker    Types: Cigarettes    Quit date: 02/26/1997    Years since quitting: 22.7  . Smokeless tobacco: Never Used  Substance and Sexual Activity  . Alcohol use: No  . Drug use: No  . Sexual activity: Not on file  Other Topics Concern  . Not on file  Social History Narrative   Patient cares for her mentally retarded daughter,  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    Walks with a rolling walker.    Social Determinants of Health   Financial Resource Strain:   . Difficulty of Paying Living Expenses:   Food Insecurity:   . Worried About Programme researcher, broadcasting/film/video in the Last Year:   . Barista in the Last Year:   Transportation Needs:   . Freight forwarder (Medical):   Marland Kitchen Lack of Transportation (Non-Medical):   Physical Activity:   . Days of Exercise per Week:   . Minutes of Exercise per  Session:   Stress:   . Feeling of Stress :   Social Connections:   . Frequency of Communication with Friends and Family:   . Frequency of Social Gatherings with Friends and Family:   . Attends Religious Services:   . Active Member of Clubs or Organizations:   . Attends Banker Meetings:   Marland Kitchen Marital Status:   Intimate Partner Violence:   . Fear of Current or Ex-Partner:   . Emotionally Abused:   Marland Kitchen Physically Abused:   . Sexually Abused:     ROS No falls, improved mood No nausea, vomiting or upset stomach  Objective   Vitals as reported by the patient: There were no vitals filed for this visit.  Tsuyako was seen today for dme request and medical management of chronic issues.  Diagnoses and all orders for this visit:  Limited mobility -     THN CM Comm to Child psychotherapist  At high risk for injury related to fall -     THN CM Comm to Child psychotherapist  Requires assistance with all daily activities -     THN CM Comm to Child psychotherapist  Osteoarthritis of multiple joints, unspecified osteoarthritis type -     THN CM Comm to Child psychotherapist   Referral placed for [facility] Due to patient not having a phone with a camera could not do the face-to-face which is required by CMS to order Home Health Will coordinate with [facility] regarding social work to assist with getting a face-to-face encounter either by arranging transport or providing an ipad Also discussed that the patient should ask her  granddaughter to help with this but her granddaughter works out of state in [place] M-F DME Prescription for bedside commode and wheelchair sent for fax Patient can propel a wheelchair. She is unable to perform her daily activities without a wheelchair.   I discussed the assessment and treatment plan with the patient. The patient was provided an opportunity to ask questions and all were answered. The patient agreed with the plan and demonstrated an understanding of the instructions.   The patient was advised to call back or seek an in-person evaluation if the symptoms worsen or if the condition fails to improve as anticipated.  I provided 15 minutes of non-face-to-face time during this encounter.  Doristine Bosworth, MD  Primary Care at [facility]

## 2019-11-13 ENCOUNTER — Other Ambulatory Visit: Payer: Self-pay | Admitting: Registered Nurse

## 2019-11-13 DIAGNOSIS — M159 Polyosteoarthritis, unspecified: Secondary | ICD-10-CM

## 2019-11-14 NOTE — Telephone Encounter (Signed)
Patient was recently seen on 11/07/19 via Video and requesting refill on Tramadol that is not currently on med list. Please advise

## 2019-11-24 ENCOUNTER — Telehealth: Payer: Self-pay | Admitting: Family Medicine

## 2019-11-24 NOTE — Telephone Encounter (Signed)
Pt is calling because Dr.Stallings had ordered a bedside commode and wheel chair with [facility] . Pt called [facility] and they say they dont have an order . Pt is calling with new fax number for [facility]  New fax # [phone] .    Any questions call Wendi at [phone]

## 2019-11-24 NOTE — Telephone Encounter (Signed)
Spoke with Sallye Ober and she is going to contact [facility] so they can fax [place] the paperwork to get filled out for her commode and wheel chair

## 2019-12-07 ENCOUNTER — Telehealth: Payer: Self-pay | Admitting: Family Medicine

## 2019-12-07 DIAGNOSIS — Z9181 History of falling: Secondary | ICD-10-CM

## 2019-12-07 NOTE — Telephone Encounter (Signed)
Pt called state she still wetting for walker and her chair the insurance said they did get the paper Work Doctor Creta Levin sing and send 11/24/19 Please Advice

## 2019-12-08 ENCOUNTER — Telehealth: Payer: Self-pay | Admitting: Family Medicine

## 2019-12-08 NOTE — Telephone Encounter (Signed)
[facility] calling with signed order for wheel chair . Office notes have to specify no walker cane or crutch. Office notes have to specify that patient can safely propel in wheel chair . Also have to state pt is unable to perform her daily activities with out wheel chair .   Please send Amended not to [phone] or call [phone]

## 2019-12-08 NOTE — Telephone Encounter (Signed)
I re-faxed paperwork this am to [facility] but [facility] need you to amend your last office visit to stated the below msg. Once this is complete can you let me know then I can fax amended notes to [facility] once again

## 2019-12-08 NOTE — Telephone Encounter (Signed)
Patient was informed that paperwork was faxed on 10/05/19 to [facility] by [facility] and I faxed once again today myself. If she have not heard from them in the next few days get back with [place].

## 2019-12-09 NOTE — Telephone Encounter (Signed)
Pt called back needs a Bed side commode not a shower chair can we change the Rx and refax?

## 2019-12-09 NOTE — Telephone Encounter (Signed)
Pt called and stated that she needs a beside commode not a shower chair. Pt is needing this fixed. Pt would like a call when this matter is resolved. [phone] Please advise.

## 2019-12-13 ENCOUNTER — Other Ambulatory Visit: Payer: Self-pay | Admitting: Emergency Medicine

## 2019-12-13 ENCOUNTER — Telehealth: Payer: Self-pay

## 2019-12-13 DIAGNOSIS — Z9181 History of falling: Secondary | ICD-10-CM

## 2019-12-13 NOTE — Telephone Encounter (Signed)
Per Dr Creta Levin its ok to send

## 2019-12-13 NOTE — Telephone Encounter (Signed)
Pt needs an appointment to establish care for new prescriptions

## 2019-12-13 NOTE — Telephone Encounter (Signed)
Spoke to Dr Creta Levin and she stated Dr Leretha Pol can sign DME for this patient for her. I have faxed this order to [facility]

## 2019-12-15 NOTE — Telephone Encounter (Signed)
Patient's concern/request has been addressed 

## 2019-12-20 ENCOUNTER — Telehealth: Payer: Self-pay

## 2019-12-26 ENCOUNTER — Telehealth: Payer: Self-pay | Admitting: Registered Nurse

## 2019-12-26 NOTE — Telephone Encounter (Signed)
LVm asking for a return call for further details regarding wheel chair request.

## 2019-12-26 NOTE — Telephone Encounter (Signed)
Contacted [facility] to verify receipt of previously faxed orders on 6/1 . They denied receiving paperwork,  refaxed addended notes and wheelchair orders today confirmation received faxed to  [phone] attn [facility] .

## 2019-12-26 NOTE — Telephone Encounter (Signed)
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 Wants a call from a nurse so she can explain further.   Please call [phone]

## 2019-12-27 ENCOUNTER — Telehealth: Payer: Self-pay | Admitting: Family Medicine

## 2019-12-27 NOTE — Telephone Encounter (Signed)
Left message with Amy Gallagher to pass message to Amy Gallagher regarding pts wheel chair

## 2019-12-27 NOTE — Telephone Encounter (Signed)
[facility] called regarding patient wheelchair referral  ask for angela banks  She was asking for [facility]    [phone]

## 2019-12-27 NOTE — Telephone Encounter (Signed)
Pt is aware aware additional info was faxed yesterday

## 2019-12-28 ENCOUNTER — Other Ambulatory Visit: Payer: Self-pay

## 2019-12-28 ENCOUNTER — Telehealth (INDEPENDENT_AMBULATORY_CARE_PROVIDER_SITE_OTHER): Payer: Medicare HMO | Admitting: Registered Nurse

## 2019-12-28 VITALS — Ht 60.0 in | Wt 182.0 lb

## 2019-12-28 DIAGNOSIS — M159 Polyosteoarthritis, unspecified: Secondary | ICD-10-CM | POA: Diagnosis not present

## 2019-12-28 DIAGNOSIS — M25572 Pain in left ankle and joints of left foot: Secondary | ICD-10-CM | POA: Diagnosis not present

## 2019-12-28 DIAGNOSIS — M25561 Pain in right knee: Secondary | ICD-10-CM | POA: Diagnosis not present

## 2019-12-28 DIAGNOSIS — M25562 Pain in left knee: Secondary | ICD-10-CM | POA: Diagnosis not present

## 2019-12-28 DIAGNOSIS — G8929 Other chronic pain: Secondary | ICD-10-CM

## 2019-12-28 MED ORDER — GABAPENTIN 100 MG PO CAPS: 100.0000 mg | ORAL_CAPSULE | Freq: Three times a day (TID) | ORAL | 3 refills | Status: DC

## 2019-12-28 MED ORDER — PREDNISONE 10 MG PO TABS: ORAL_TABLET | ORAL | 0 refills | Status: AC

## 2019-12-29 DIAGNOSIS — M1990 Unspecified osteoarthritis, unspecified site: Secondary | ICD-10-CM | POA: Diagnosis not present

## 2019-12-29 DIAGNOSIS — M109 Gout, unspecified: Secondary | ICD-10-CM | POA: Diagnosis not present

## 2019-12-29 DIAGNOSIS — Z7409 Other reduced mobility: Secondary | ICD-10-CM | POA: Diagnosis not present

## 2020-01-03 ENCOUNTER — Telehealth: Payer: Self-pay | Admitting: Registered Nurse

## 2020-01-03 NOTE — Telephone Encounter (Signed)
Before refilling the prednisone I would like her to try the gabapentin - while there are listed side effects, at the low dose I have given her, I don't expect these to happen.  Thank you,  [name], NP

## 2020-01-03 NOTE — Telephone Encounter (Signed)
Pt called to let Dr know that she has started taking the predniSONE (DELTASONE) 10 MG tablet  And it is helping and she wanted to know if she will get a refill/ Pt also wanted to let him know she needs something for pain to help her knee when she gets up on her walker. Pt states she is afraid to take Gabapentin and hasnt taken it due to the side effects / please advise

## 2020-01-03 NOTE — Telephone Encounter (Signed)
Pt is asking for refill on prednisone. Feels it is really working for her. She is afraid to take the gabapentin due to all the side effects she has read about. She is having lots of pain. Wanting another med other than tramadol for that. The tramadol does not work for the pain either

## 2020-01-03 NOTE — Telephone Encounter (Signed)
Spoke with pt, says she will try the low dose of gabapentin. She will call the office if she feels the medication is causing any undesirable side effects

## 2020-01-03 NOTE — Telephone Encounter (Signed)
Shanda Bumps from [place] called to request a new order for a wheelchair for pt. She needs documentation explaining that a walker/cane is not sufficient and that she indeed needs a DME order for a Wheelchair for [facility] in [place]. Please advise  [facility] Fax: [phone] Shanda Bumps from [place]  Best contact [phone] ext [phone]

## 2020-01-11 ENCOUNTER — Telehealth: Payer: Self-pay | Admitting: Adult Health

## 2020-01-11 NOTE — Telephone Encounter (Signed)
Called and LMOM about COVID homebound vaccine program.  Asked that she call me back at [phone].  Lillard Anes, NP

## 2020-01-11 NOTE — Telephone Encounter (Signed)
Pt called and what to know what going on with her wheelchair she still wetting for please Advice

## 2020-01-11 NOTE — Telephone Encounter (Signed)
Please Advise

## 2020-01-12 ENCOUNTER — Telehealth: Payer: Self-pay | Admitting: Registered Nurse

## 2020-01-12 ENCOUNTER — Telehealth: Payer: Self-pay | Admitting: Physician Assistant

## 2020-01-12 NOTE — Telephone Encounter (Signed)
I connected by phone with Gus Height and/or patient's caregiver on 01/12/2020 at 3:51 PM to discuss the potential vaccination through our Homebound vaccination initiative.   Prevaccination Checklist for COVID-19 Vaccines  1.  Are you feeling sick today? no  2.  Have you ever received a dose of a COVID-19 vaccine?  no      If yes, which one? None   3.  Have you ever had an allergic reaction: (This would include a severe reaction [ e.g., anaphylaxis] that required treatment with epinephrine or EpiPen or that caused you to go to the hospital.  It would also include an allergic reaction that occurred within 4 hours that caused hives, swelling, or respiratory distress, including wheezing.) A.  A previous dose of COVID-19 vaccine. no  B.  A vaccine or injectable therapy that contains multiple components, one of which is a COVID-19 vaccine component, but it is not known which component elicited the immediate reaction. no  C.  Are you allergic to polyethylene glycol? no  D. Are you allergic to Polysorbate, which is found in some vaccines, film coated tablets and intravenous steroids?  no   4.  Have you ever had an allergic reaction to another vaccine (other than COVID-19 vaccine) or an injectable medication? (This would include a severe reaction [ e.g., anaphylaxis] that required treatment with epinephrine or EpiPen or that caused you to go to the hospital.  It would also include an allergic reaction that occurred within 4 hours that caused hives, swelling, or respiratory distress, including wheezing.)  no   5.  Have you ever had a severe allergic reaction (e.g., anaphylaxis) to something other than a component of the COVID-19 vaccine, or any vaccine or injectable medication?  This would include food, pet, venom, environmental, or oral medication allergies.  no   6.  Have you received any vaccine in the last 14 days? no   7.  Have you ever had a positive test for COVID-19 or has a doctor ever told  you that you had COVID-19?  no   8.  Have you received passive antibody therapy (monoclonal antibodies or convalescent serum) as a treatment for COVID-19? no   9.  Do you have a weakened immune system caused by something such as HIV infection or cancer or do you take immunosuppressive drugs or therapies?  no   10.  Do you have a bleeding disorder or are you taking a blood thinner? no   11.  Are you pregnant or breast-feeding? no   12.  Do you have dermal fillers? no    This patient is a 86 y.o. female that meets the FDA criteria to receive homebound vaccination. Patient or parent/caregiver understands they have the option to accept or refuse homebound vaccination.  Patient passed the pre-screening checklist and would like to proceed with homebound vaccination.  Based on questionnaire above, I recommend the patient be observed for 30 minutes. (allergic to aspirin and Penicillins).   There are an estimated # 2  other household members/caregivers who are also interested in receiving the vaccine.     I will send the patient's information to our scheduling team who will reach out to schedule the patient and potential caregiver/family members for homebound vaccination.    Manson Passey 01/12/2020 3:51 PM

## 2020-01-12 NOTE — Telephone Encounter (Signed)
Patient called to provide phone number  For authorization   [phone] [facility]  Needs provider to call and approve the medical need for the medical equipment.   Needs more aid . In Home health  Needs bed, and shower stool , and she still has not been able to get a wheel chair

## 2020-01-13 NOTE — Telephone Encounter (Signed)
Patient called to provide phone number  For authorization    [phone] [facility]   Needs provider to call and approve the medical need for the medical equipment.    Needs more aid . In Home health  Needs bed, and shower stool , and she still has not been able to get a wheel chair    If you need new orders let us know

## 2020-01-17 ENCOUNTER — Telehealth (INDEPENDENT_AMBULATORY_CARE_PROVIDER_SITE_OTHER): Payer: Medicare HMO | Admitting: Registered Nurse

## 2020-01-17 ENCOUNTER — Encounter: Payer: Self-pay | Admitting: Registered Nurse

## 2020-01-17 ENCOUNTER — Other Ambulatory Visit: Payer: Self-pay

## 2020-01-17 DIAGNOSIS — M159 Polyosteoarthritis, unspecified: Secondary | ICD-10-CM

## 2020-01-17 DIAGNOSIS — Z7409 Other reduced mobility: Secondary | ICD-10-CM

## 2020-01-17 DIAGNOSIS — M25562 Pain in left knee: Secondary | ICD-10-CM | POA: Diagnosis not present

## 2020-01-17 DIAGNOSIS — Z741 Need for assistance with personal care: Secondary | ICD-10-CM | POA: Diagnosis not present

## 2020-01-17 DIAGNOSIS — Z9181 History of falling: Secondary | ICD-10-CM

## 2020-01-17 DIAGNOSIS — G8929 Other chronic pain: Secondary | ICD-10-CM

## 2020-01-17 DIAGNOSIS — M25561 Pain in right knee: Secondary | ICD-10-CM

## 2020-01-17 MED ORDER — PREDNISONE 10 MG PO TABS: ORAL_TABLET | ORAL | 0 refills | Status: AC

## 2020-01-17 NOTE — Patient Instructions (Signed)
° ° ° °  If you have lab work done today you will be contacted with your lab results within the next 2 weeks.  If you have not heard from us then please contact us. The fastest way to get your results is to register for My Chart. ° ° °IF you received an x-ray today, you will receive an invoice from [facility]. Please contact [facility] at [phone] with questions or concerns regarding your invoice.  ° °IF you received labwork today, you will receive an invoice from [facility]. Please contact [facility] at [phone] with questions or concerns regarding your invoice.  ° °Our billing staff will not be able to assist you with questions regarding bills from these companies. ° °You will be contacted with the lab results as soon as they are available. The fastest way to get your results is to activate your My Chart account. Instructions are located on the last page of this paperwork. If you have not heard from us regarding the results in 2 weeks, please contact this office. °  ° ° ° °

## 2020-01-17 NOTE — Progress Notes (Addendum)
Telemedicine Encounter- SOAP NOTE Established Patient  This audiovisual encounter was conducted with the patient's (or proxy's) verbal consent via audio telecommunications: yes  Patient was instructed to have this encounter in a suitably private space; and to only have persons present to whom they give permission to participate. In addition, patient identity was confirmed by use of name plus two identifiers (DOB and address).  I discussed the limitations, risks, security and privacy concerns of performing an evaluation and management service by telephone and the availability of in person appointments. I also discussed with the patient that there may be a patient responsible charge related to this service. The patient expressed understanding and agreed to proceed.  Patient is at home, provider is in office.  I spent a total of 14 minutes talking with the patient or their proxy.  Chief Complaint  Patient presents with  . Medication Problem    Patient states that her knees are still having servere pain. Patient states she needs something different for pain or tell her what ways to take her medication also needs physical therapy.    Subjective   Amy Gallagher is a 86 y.o. established patient. Telephone visit today for ongoing knee pain  HPI History of OA in both knees.  She gets episodic relief from prednisone tapers. However, tramadol and gabapentin do not provide effective relief in the interim.  Overall, mild medical history considering her age. She is hoping to be a candidate for home health and PT given her chronic pain and how much it interferes with her mobility and ADLs. I agree that she would be a good candidate. Pain is still aching, worse with movement, swelling and warmth around knees. No changes   Patient Active Problem List   Diagnosis Date Noted  . Urinary urgency 06/10/2016  . Environmental allergies 08/14/2011  . Gout 09/10/2006  . DEPRESSION, MAJOR, RECURRENT  09/10/2006  . Former smoker 09/10/2006  . HYPERTENSION, BENIGN SYSTEMIC 09/10/2006  . Mild intermittent asthma 09/10/2006  . REFLUX ESOPHAGITIS 09/10/2006  . Arthritis 09/10/2006    Past Medical History:  Diagnosis Date  . Arthritis   . Bronchitis   . Gout     Current Outpatient Medications  Medication Sig Dispense Refill  . albuterol (PROVENTIL) (2.5 MG/3ML) 0.083% nebulizer solution Take 3 mLs (2.5 mg total) by nebulization 2 (two) times daily as needed for wheezing or shortness of breath. 150 mL 1  . albuterol (VENTOLIN HFA) 108 (90 Base) MCG/ACT inhaler INHALE 2 PUFFS INTO THE LUNGS EVERY 4 HOURS AS NEEDED FOR WHEEZING OR SHORTNESS OF BREATH NOV 8 g 1  . allopurinol (ZYLOPRIM) 100 MG tablet Take 1 tablet (100 mg total) by mouth daily. 90 tablet 1  . ALPRAZolam (XANAX) 0.25 MG tablet Take 0.5 tablets (0.125 mg total) by mouth daily. 15 tablet 0  . benzonatate (TESSALON) 100 MG capsule Take 1-2 capsules (100-200 mg total) by mouth 3 (three) times daily as needed for cough. 40 capsule 1  . citalopram (CELEXA) 10 MG tablet Take 1 tablet (10 mg total) by mouth daily. 90 tablet 1  . Colchicine (MITIGARE) 0.6 MG CAPS Take 0.6 mg by mouth daily. Dispense as MITIGARE 90 capsule 1  . Dextromethorphan HBr 10 MG/15ML SYRP Take 15 mLs (10 mg total) by mouth daily. 120 mL 3  . fluticasone (FLONASE) 50 MCG/ACT nasal spray SPRAY 2 SPRAYS INTO EACH NOSTRIL EVERY DAY 16 mL 1  . fluticasone (FLOVENT HFA) 44 MCG/ACT inhaler Inhale 2 puffs into  the lungs 2 (two) times daily. 1 Inhaler 5  . gabapentin (NEURONTIN) 100 MG capsule Take 1 capsule (100 mg total) by mouth 3 (three) times daily. 90 capsule 3  . hydrochlorothiazide (HYDRODIURIL) 25 MG tablet Take 1 tablet (25 mg total) by mouth daily. 90 tablet 1  . Misc. Devices (COMMODE) MISC Please dispense one rail for commode. ICD10 Z74.09 1 each 0  . Misc. Devices [facility]) MISC 1 Device by Does not apply route daily. 1 each 0  . Multiple Vitamin  (MULTIVITAMIN WITH MINERALS) TABS tablet Take 1 tablet by mouth daily.    . pantoprazole (PROTONIX) 40 MG tablet TAKE 1 TABLET BY MOUTH EVERY DAY 90 tablet 1  . traMADol (ULTRAM) 50 MG tablet TAKE 1 TABLET BY MOUTH EVERY MORNING 30 tablet 0  . trolamine salicylate (ASPERCREME/ALOE) 10 % cream Apply 1 application topically as needed for muscle pain. 85 g 0  . vitamin C (ASCORBIC ACID) 500 MG tablet Take 1 tablet (500 mg total) by mouth daily. 90 tablet 3   No current facility-administered medications for this visit.    Allergies  Allergen Reactions  . Aspirin Nausea And Vomiting  . Penicillins Swelling    Social History   Socioeconomic History  . Marital status: Widowed    Spouse name: Not on file  . Number of children: Not on file  . Years of education: Not on file  . Highest education level: Not on file  Occupational History  . Not on file  Tobacco Use  . Smoking status: Former Smoker    Types: Cigarettes    Quit date: 02/26/1997    Years since quitting: 22.9  . Smokeless tobacco: Never Used  Substance and Sexual Activity  . Alcohol use: No  . Drug use: No  . Sexual activity: Not on file  Other Topics Concern  . Not on file  Social History Narrative   Patient cares for her mentally 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    Walks with a rolling walker.    Social Determinants of Health   Financial Resource Strain:   . Difficulty of Paying Living Expenses:   Food Insecurity:   . Worried About Programme researcher, broadcasting/film/video in the Last Year:   . Barista in the Last Year:   Transportation Needs:   . Freight forwarder (Medical):   Marland Kitchen Lack of Transportation (Non-Medical):   Physical Activity:   . Days of Exercise per Week:   . Minutes of Exercise per Session:   Stress:   . Feeling of Stress :   Social Connections:   . Frequency of Communication with Friends and Family:   . Frequency of Social Gatherings with Friends and Family:   .  Attends Religious Services:   . Active Member of Clubs or Organizations:   . Attends Banker Meetings:   Marland Kitchen Marital Status:   Intimate Partner Violence:   . Fear of Current or Ex-Partner:   . Emotionally Abused:   Marland Kitchen Physically Abused:   . Sexually Abused:     ROS Per hpi   Objective   Vitals as reported by the patient: There were no vitals filed for this visit.  Amy Gallagher was seen today for medication problem.  Diagnoses and all orders for this visit:  Chronic pain of both knees -     Ambulatory referral to Physical Therapy -     Ambulatory referral to Orthopedic Surgery -  Ambulatory referral to Pain Clinic -     Ambulatory referral to Home Health  Osteoarthritis of multiple joints, unspecified osteoarthritis type -     Ambulatory referral to Physical Therapy -     Ambulatory referral to Orthopedic Surgery -     Ambulatory referral to Pain Clinic -     Ambulatory referral to Home Health  At high risk for injury related to fall -     Ambulatory referral to Home Health  Limited mobility -     Ambulatory referral to Home Health  Requires assistance with all daily activities -     Ambulatory referral to Home Health   PLAN  Pt will be referred to home health for nursing and PT given her limited mobility, high fall risk, and the difficulty that it takes her to get out to an appt.  Prednisone taper  Increase gabapentin to 100mg  PO qid  Patient encouraged to call clinic with any questions, comments, or concerns.  I discussed the assessment and treatment plan with the patient. The patient was provided an opportunity to ask questions and all were answered. The patient agreed with the plan and demonstrated an understanding of the instructions.   The patient was advised to call back or seek an in-person evaluation if the symptoms worsen or if the condition fails to improve as anticipated.  I provided 14 minutes of audiovisual time during this  encounter.  , NP  Primary Care at [facility]

## 2020-01-17 NOTE — Telephone Encounter (Signed)
I have finished this paperwork and returned it to [place] to fax.  Thank you  [name], NP

## 2020-01-17 NOTE — Progress Notes (Addendum)
Telemedicine Encounter- SOAP NOTE Established Patient  This telephone encounter was conducted with the patient's (or proxy's) verbal consent via audio telecommunications: yes  Patient was instructed to have this encounter in a suitably private space; and to only have persons present to whom they give permission to participate. In addition, patient identity was confirmed by use of name plus two identifiers (DOB and address).  I discussed the limitations, risks, security and privacy concerns of performing an evaluation and management service by telephone and the availability of in person appointments. I also discussed with the patient that there may be a patient responsible charge related to this service. The patient expressed understanding and agreed to proceed.  Patient is comfortable at home and provider is in office.  I spent a total of 16 minutes talking with the patient or their proxy.  Chief Complaint  Patient presents with  . Knee Pain    Pt stated that she has been having some knee pain for quite some time now. It hurts when she tries to get up and walk. She stated that she needs some stronger medication bc the tramadol does not work.     Subjective   Amy Gallagher is a 86 y.o. established patient. Telephone visit today for ongoing chronic pain and OA  HPI Pt has OA of multiple joints. Worst in knees. Bad in hips. Trouble with mobility - painful and stiff at all times. OTCs not effective. Has tried a number of rx treatments in the past, but at this time, tramadol not effective. Long term opioid use has been avoided due to this patient's age and fall risk Pt relatively self sufficient at home but has family nearby if needed Pt has not fallen but reports feeling unsteady on feet.  Patient Active Problem List   Diagnosis Date Noted  . Urinary urgency 06/10/2016  . Environmental allergies 08/14/2011  . Gout 09/10/2006  . DEPRESSION, MAJOR, RECURRENT 09/10/2006  . Former  smoker 09/10/2006  . HYPERTENSION, BENIGN SYSTEMIC 09/10/2006  . Mild intermittent asthma 09/10/2006  . REFLUX ESOPHAGITIS 09/10/2006  . Arthritis 09/10/2006    Past Medical History:  Diagnosis Date  . Arthritis   . Bronchitis   . Gout     Current Outpatient Medications  Medication Sig Dispense Refill  . albuterol (PROVENTIL) (2.5 MG/3ML) 0.083% nebulizer solution Take 3 mLs (2.5 mg total) by nebulization 2 (two) times daily as needed for wheezing or shortness of breath. 150 mL 1  . albuterol (VENTOLIN HFA) 108 (90 Base) MCG/ACT inhaler INHALE 2 PUFFS INTO THE LUNGS EVERY 4 HOURS AS NEEDED FOR WHEEZING OR SHORTNESS OF BREATH NOV 8 g 1  . allopurinol (ZYLOPRIM) 100 MG tablet Take 1 tablet (100 mg total) by mouth daily. 90 tablet 1  . ALPRAZolam (XANAX) 0.25 MG tablet Take 0.5 tablets (0.125 mg total) by mouth daily. 15 tablet 0  . benzonatate (TESSALON) 100 MG capsule Take 1-2 capsules (100-200 mg total) by mouth 3 (three) times daily as needed for cough. 40 capsule 1  . citalopram (CELEXA) 10 MG tablet Take 1 tablet (10 mg total) by mouth daily. 90 tablet 1  . Colchicine (MITIGARE) 0.6 MG CAPS Take 0.6 mg by mouth daily. Dispense as MITIGARE 90 capsule 1  . Dextromethorphan HBr 10 MG/15ML SYRP Take 15 mLs (10 mg total) by mouth daily. 120 mL 3  . fluticasone (FLONASE) 50 MCG/ACT nasal spray SPRAY 2 SPRAYS INTO EACH NOSTRIL EVERY DAY 16 mL 1  . fluticasone (FLOVENT  HFA) 44 MCG/ACT inhaler Inhale 2 puffs into the lungs 2 (two) times daily. 1 Inhaler 5  . hydrochlorothiazide (HYDRODIURIL) 25 MG tablet Take 1 tablet (25 mg total) by mouth daily. 90 tablet 1  . Misc. Devices (COMMODE) MISC Please dispense one rail for commode. ICD10 Z74.09 1 each 0  . Misc. Devices [facility]) MISC 1 Device by Does not apply route daily. 1 each 0  . Multiple Vitamin (MULTIVITAMIN WITH MINERALS) TABS tablet Take 1 tablet by mouth daily.    . pantoprazole (PROTONIX) 40 MG tablet TAKE 1 TABLET BY MOUTH EVERY  DAY 90 tablet 1  . traMADol (ULTRAM) 50 MG tablet TAKE 1 TABLET BY MOUTH EVERY MORNING 30 tablet 0  . trolamine salicylate (ASPERCREME/ALOE) 10 % cream Apply 1 application topically as needed for muscle pain. 85 g 0  . vitamin C (ASCORBIC ACID) 500 MG tablet Take 1 tablet (500 mg total) by mouth daily. 90 tablet 3  . gabapentin (NEURONTIN) 100 MG capsule Take 1 capsule (100 mg total) by mouth 3 (three) times daily. 90 capsule 3   No current facility-administered medications for this visit.    Allergies  Allergen Reactions  . Aspirin Nausea And Vomiting  . Penicillins Swelling    Social History   Socioeconomic History  . Marital status: Widowed    Spouse name: Not on file  . Number of children: Not on file  . Years of education: Not on file  . Highest education level: Not on file  Occupational History  . Not on file  Tobacco Use  . Smoking status: Former Smoker    Types: Cigarettes    Quit date: 02/26/1997    Years since quitting: 22.9  . Smokeless tobacco: Never Used  Substance and Sexual Activity  . Alcohol use: No  . Drug use: No  . Sexual activity: Not on file  Other Topics Concern  . Not on file  Social History Narrative   Patient cares for her mentally 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    Walks with a rolling walker.    Social Determinants of Health   Financial Resource Strain:   . Difficulty of Paying Living Expenses:   Food Insecurity:   . Worried About Programme researcher, broadcasting/film/video in the Last Year:   . Barista in the Last Year:   Transportation Needs:   . Freight forwarder (Medical):   Marland Kitchen Lack of Transportation (Non-Medical):   Physical Activity:   . Days of Exercise per Week:   . Minutes of Exercise per Session:   Stress:   . Feeling of Stress :   Social Connections:   . Frequency of Communication with Friends and Family:   . Frequency of Social Gatherings with Friends and Family:   . Attends Religious  Services:   . Active Member of Clubs or Organizations:   . Attends Banker Meetings:   Marland Kitchen Marital Status:   Intimate Partner Violence:   . Fear of Current or Ex-Partner:   . Emotionally Abused:   Marland Kitchen Physically Abused:   . Sexually Abused:     ROS Per hpi   Otherwise negative on 12pt ros Objective   Vitals as reported by the patient: Today's Vitals   12/28/19 1632  Weight: 182 lb (82.6 kg)  Height: 5' (1.524 m)    Amy Gallagher was seen today for knee pain.  Diagnoses and all orders for this visit:  Osteoarthritis of  multiple joints, unspecified osteoarthritis type -     gabapentin (NEURONTIN) 100 MG capsule; Take 1 capsule (100 mg total) by mouth 3 (three) times daily.  Acute left ankle pain -     gabapentin (NEURONTIN) 100 MG capsule; Take 1 capsule (100 mg total) by mouth 3 (three) times daily. -     predniSONE (DELTASONE) 10 MG tablet; Take 2 tablets (20 mg total) by mouth daily with breakfast for 3 days, THEN 1 tablet (10 mg total) daily with breakfast for 3 days.  Chronic pain of both knees -     gabapentin (NEURONTIN) 100 MG capsule; Take 1 capsule (100 mg total) by mouth 3 (three) times daily.   PLAN  Quick prednisone taper and low dose of gabapentin at 100mg  PO tid. May take an additional gabapentin before bed if needed  This patient is fall risk due to her multiple joint arthritis and chronic pain. We should consider a wheelchair for this patient going forward as unfortunately I am not sure she would be a great candidate for surgical intervention.  Patient encouraged to call clinic with any questions, comments, or concerns.  I discussed the assessment and treatment plan with the patient. The patient was provided an opportunity to ask questions and all were answered. The patient agreed with the plan and demonstrated an understanding of the instructions.   The patient was advised to call back or seek an in-person evaluation if the symptoms worsen or if the  condition fails to improve as anticipated.  I provided 16 minutes of non-face-to-face time during this encounter.  , NP  Primary Care at [facility]

## 2020-01-23 ENCOUNTER — Other Ambulatory Visit: Payer: Self-pay | Admitting: Registered Nurse

## 2020-01-23 ENCOUNTER — Telehealth: Payer: Self-pay | Admitting: Registered Nurse

## 2020-01-23 DIAGNOSIS — J45909 Unspecified asthma, uncomplicated: Secondary | ICD-10-CM

## 2020-01-23 NOTE — Telephone Encounter (Signed)
In 01/17/20 home health referral there is a note asking for PT and nursing for home health- I placed one for in office if she would prefer to get started sooner.  Thank you  [name], NP

## 2020-01-23 NOTE — Telephone Encounter (Signed)
Called and informed pt of PT referrals and pt was agreeable said she would contact us if she didn't hear from home PT

## 2020-01-23 NOTE — Telephone Encounter (Signed)
Pt is confused as she thought you ordered in home PT for her not for her to go to an office. I do not see this specification anywhere can she have this reordered to be in home?

## 2020-01-23 NOTE — Telephone Encounter (Signed)
Pt had a visit with you on 01/17/20 for Patient states that her knees are still having servere pain. She says you gave her a referral for someone to come to her house to give her physical therapy. The facility that got in touch with her is wanting her to come into their office. She was under the impression someone would come out to her house. This is what she is wanting. Please advise at [phone].

## 2020-01-23 NOTE — Telephone Encounter (Signed)
I have filled out paperwork for a wheelchair and [facility] faxed it. As far as a home health referral, this was placed mid last week so it may still be in process.  Thank you  Rich

## 2020-01-24 NOTE — Telephone Encounter (Signed)
In regards to pt's home health referral - visit needs to be documented as audio-visual.  Needs to be either video visit or face to face in order for medicare to cover.

## 2020-01-24 NOTE — Telephone Encounter (Signed)
I have changed the documentation to reflect that  Thank you,  [name], NP

## 2020-01-24 NOTE — Telephone Encounter (Signed)
Please Advise

## 2020-01-25 ENCOUNTER — Ambulatory Visit: Payer: Medicare Other | Attending: Critical Care Medicine

## 2020-01-25 DIAGNOSIS — Z23 Encounter for immunization: Secondary | ICD-10-CM

## 2020-01-25 NOTE — Progress Notes (Signed)
   Covid-19 Vaccination Clinic  Name:  Amy Gallagher    MRN: [id number] DOB: 1930/01/07  01/25/2020  Ms. Amy Gallagher was observed post Covid-19 immunization for 15 minutes without incident. She was provided with Vaccine Information Sheet and instruction to access the V-Safe system.   Ms. Amy Gallagher was instructed to call 911 with any severe reactions post vaccine: Marland Kitchen Difficulty breathing  . Swelling of face and throat  . A fast heartbeat  . A bad rash all over body  . Dizziness and weakness   Immunizations Administered    Name Date Dose VIS Date Route   Moderna COVID-19 Vaccine 01/25/2020 12:12 PM 0.5 mL 06/2019 Intramuscular   Manufacturer: Moderna   Lot: [id number]   NDC: 79150-569-79

## 2020-01-30 ENCOUNTER — Telehealth: Payer: Self-pay | Admitting: Registered Nurse

## 2020-01-30 DIAGNOSIS — J4520 Mild intermittent asthma, uncomplicated: Secondary | ICD-10-CM | POA: Diagnosis not present

## 2020-01-30 DIAGNOSIS — M109 Gout, unspecified: Secondary | ICD-10-CM | POA: Diagnosis not present

## 2020-01-30 DIAGNOSIS — M170 Bilateral primary osteoarthritis of knee: Secondary | ICD-10-CM | POA: Diagnosis not present

## 2020-01-30 DIAGNOSIS — Z7951 Long term (current) use of inhaled steroids: Secondary | ICD-10-CM | POA: Diagnosis not present

## 2020-01-30 DIAGNOSIS — F339 Major depressive disorder, recurrent, unspecified: Secondary | ICD-10-CM | POA: Diagnosis not present

## 2020-01-30 DIAGNOSIS — I10 Essential (primary) hypertension: Secondary | ICD-10-CM | POA: Diagnosis not present

## 2020-01-30 DIAGNOSIS — Z79891 Long term (current) use of opiate analgesic: Secondary | ICD-10-CM | POA: Diagnosis not present

## 2020-01-30 DIAGNOSIS — N3941 Urge incontinence: Secondary | ICD-10-CM | POA: Diagnosis not present

## 2020-01-30 DIAGNOSIS — K219 Gastro-esophageal reflux disease without esophagitis: Secondary | ICD-10-CM | POA: Diagnosis not present

## 2020-01-30 NOTE — Telephone Encounter (Signed)
[employer] called and were wondering on the status of pts wheelchair. She also stated that she is needing nursing orders for 1 time a week for 4 weeks for pain management and care. Call back number: [phone]  Please advise.

## 2020-01-31 NOTE — Telephone Encounter (Signed)
[employer] nurse  is asking orders for wheelchair be faxed over to [phone]

## 2020-01-31 NOTE — Telephone Encounter (Signed)
Spoke to patient to see if they came by the office to pick up wheelchair order and they have not. I also did not see order at front desk. Patient is also stating she need a mobile wheelchair instead of a standard wheel chair.

## 2020-01-31 NOTE — Telephone Encounter (Signed)
I had printed out wheelchair orders and left them up front for pick up - I believe a family member had stopped by to get them - but I can replace them if needed.   Thank you for giving verbal orders, would happy to sign anything they fax over  [name], NP

## 2020-01-31 NOTE — Telephone Encounter (Signed)
I have called to give the verbal orders for pt for nurse orders but I'm not sure of the status of the wheelchair.   Please Advise on this.

## 2020-01-31 NOTE — Telephone Encounter (Signed)
Msg before this msg. Sorry sent this without routing this to you

## 2020-02-04 DIAGNOSIS — Z79891 Long term (current) use of opiate analgesic: Secondary | ICD-10-CM | POA: Diagnosis not present

## 2020-02-04 DIAGNOSIS — F339 Major depressive disorder, recurrent, unspecified: Secondary | ICD-10-CM | POA: Diagnosis not present

## 2020-02-04 DIAGNOSIS — K219 Gastro-esophageal reflux disease without esophagitis: Secondary | ICD-10-CM | POA: Diagnosis not present

## 2020-02-04 DIAGNOSIS — Z7951 Long term (current) use of inhaled steroids: Secondary | ICD-10-CM | POA: Diagnosis not present

## 2020-02-04 DIAGNOSIS — J4520 Mild intermittent asthma, uncomplicated: Secondary | ICD-10-CM | POA: Diagnosis not present

## 2020-02-04 DIAGNOSIS — I10 Essential (primary) hypertension: Secondary | ICD-10-CM | POA: Diagnosis not present

## 2020-02-04 DIAGNOSIS — M170 Bilateral primary osteoarthritis of knee: Secondary | ICD-10-CM | POA: Diagnosis not present

## 2020-02-04 DIAGNOSIS — M109 Gout, unspecified: Secondary | ICD-10-CM | POA: Diagnosis not present

## 2020-02-04 DIAGNOSIS — N3941 Urge incontinence: Secondary | ICD-10-CM | POA: Diagnosis not present

## 2020-02-06 ENCOUNTER — Telehealth: Payer: Self-pay | Admitting: Registered Nurse

## 2020-02-06 NOTE — Telephone Encounter (Signed)
Left a msg on patient machine. Msg left> please call your ins company to see who participate with your ins and would cover your order for the wheel chair. Once you find this information out then give [place] a call and we will fax order to the facility of your choice.

## 2020-02-06 NOTE — Telephone Encounter (Signed)
Pt missed a phone call from our office  .

## 2020-02-06 NOTE — Telephone Encounter (Signed)
Pt talked  With [facility] about wheel chair request and has been waiting 2 weeks / [employer]  would like [place] to call [phone] to give them to information they need / [employer] told patient that we only have 72 hours to call them . To expedite reguest for wheel chair

## 2020-02-06 NOTE — Telephone Encounter (Signed)
Pt called stating that she is not able to pick up this prescription at the office and is instead requesting to have the order sent to a facility that can deliver this wheelchair to her. Please advise.

## 2020-02-07 DIAGNOSIS — I10 Essential (primary) hypertension: Secondary | ICD-10-CM | POA: Diagnosis not present

## 2020-02-07 DIAGNOSIS — M109 Gout, unspecified: Secondary | ICD-10-CM | POA: Diagnosis not present

## 2020-02-07 DIAGNOSIS — Z7951 Long term (current) use of inhaled steroids: Secondary | ICD-10-CM | POA: Diagnosis not present

## 2020-02-07 DIAGNOSIS — N3941 Urge incontinence: Secondary | ICD-10-CM | POA: Diagnosis not present

## 2020-02-07 DIAGNOSIS — M170 Bilateral primary osteoarthritis of knee: Secondary | ICD-10-CM | POA: Diagnosis not present

## 2020-02-07 DIAGNOSIS — J4520 Mild intermittent asthma, uncomplicated: Secondary | ICD-10-CM | POA: Diagnosis not present

## 2020-02-07 DIAGNOSIS — Z79891 Long term (current) use of opiate analgesic: Secondary | ICD-10-CM | POA: Diagnosis not present

## 2020-02-07 DIAGNOSIS — F339 Major depressive disorder, recurrent, unspecified: Secondary | ICD-10-CM | POA: Diagnosis not present

## 2020-02-07 DIAGNOSIS — K219 Gastro-esophageal reflux disease without esophagitis: Secondary | ICD-10-CM | POA: Diagnosis not present

## 2020-02-07 NOTE — Telephone Encounter (Signed)
[employer] about wheel chair request and has been waiting 2 weeks / Francine Graven  would like [place] to call [phone] to give them to information they need / [employer] told patient that we only have 72 hours to call them . To expedite reguest for wheel chair

## 2020-02-10 DIAGNOSIS — J4520 Mild intermittent asthma, uncomplicated: Secondary | ICD-10-CM | POA: Diagnosis not present

## 2020-02-10 DIAGNOSIS — F339 Major depressive disorder, recurrent, unspecified: Secondary | ICD-10-CM | POA: Diagnosis not present

## 2020-02-10 DIAGNOSIS — Z79891 Long term (current) use of opiate analgesic: Secondary | ICD-10-CM | POA: Diagnosis not present

## 2020-02-10 DIAGNOSIS — M170 Bilateral primary osteoarthritis of knee: Secondary | ICD-10-CM | POA: Diagnosis not present

## 2020-02-10 DIAGNOSIS — K219 Gastro-esophageal reflux disease without esophagitis: Secondary | ICD-10-CM | POA: Diagnosis not present

## 2020-02-10 DIAGNOSIS — N3941 Urge incontinence: Secondary | ICD-10-CM | POA: Diagnosis not present

## 2020-02-10 DIAGNOSIS — Z7951 Long term (current) use of inhaled steroids: Secondary | ICD-10-CM | POA: Diagnosis not present

## 2020-02-10 DIAGNOSIS — M109 Gout, unspecified: Secondary | ICD-10-CM | POA: Diagnosis not present

## 2020-02-10 DIAGNOSIS — I10 Essential (primary) hypertension: Secondary | ICD-10-CM | POA: Diagnosis not present

## 2020-02-13 ENCOUNTER — Other Ambulatory Visit: Payer: Self-pay | Admitting: Registered Nurse

## 2020-02-13 DIAGNOSIS — Z9181 History of falling: Secondary | ICD-10-CM

## 2020-02-13 DIAGNOSIS — G8929 Other chronic pain: Secondary | ICD-10-CM

## 2020-02-13 DIAGNOSIS — Z7409 Other reduced mobility: Secondary | ICD-10-CM

## 2020-02-13 DIAGNOSIS — M159 Polyosteoarthritis, unspecified: Secondary | ICD-10-CM

## 2020-02-13 NOTE — Telephone Encounter (Signed)
Printed -   Thanks  [name], NP

## 2020-02-13 NOTE — Telephone Encounter (Signed)
Could you send me a New order for this patients wheelchair so I could fax it to her insurance company.

## 2020-02-13 NOTE — Telephone Encounter (Signed)
Pt called again in regard to wheelchair order. Pls fax orders to [place] and call ins company so orders can be expedited.

## 2020-02-15 NOTE — Telephone Encounter (Signed)
This matter was handled by Thea Silversmith and she reached out to [facility] and a medical supply store.

## 2020-02-16 ENCOUNTER — Telehealth: Payer: Self-pay

## 2020-02-16 NOTE — Telephone Encounter (Signed)
Recived fax. Put paperwork in providers box beside nurses station. Please advise.

## 2020-02-16 NOTE — Telephone Encounter (Signed)
Pt called and stated that Insurance stated that the wheelchair will take a couple weeks to get due to it being order from out of area. Pt does not want to wait. Pt is wondering if she can get another wheelchair sent in that isn't as high tech so she can get it sooner so she can go to the restroom and such. Please advise.

## 2020-02-16 NOTE — Telephone Encounter (Signed)
[facility] called informing practice they had faxed over information with respect to an incomplete wheelchair request.

## 2020-02-16 NOTE — Telephone Encounter (Signed)
Spoke with pt and I advised her that she will need to contact her insurance company to see if they would be willing to send her another chair in the place of her waiting on the original chair

## 2020-02-22 ENCOUNTER — Ambulatory Visit: Payer: Medicare Other | Attending: Critical Care Medicine

## 2020-02-22 DIAGNOSIS — Z23 Encounter for immunization: Secondary | ICD-10-CM

## 2020-02-22 NOTE — Progress Notes (Signed)
   Covid-19 Vaccination Clinic  Name:  Amy Gallagher    MRN: [id number] DOB: 08/17/1929  02/22/2020  Amy Gallagher was observed post Covid-19 immunization for 15 minutes without incident. She was provided with Vaccine Information Sheet and instruction to access the V-Safe system.   Amy Gallagher was instructed to call 911 with any severe reactions post vaccine: Marland Kitchen Difficulty breathing  . Swelling of face and throat  . A fast heartbeat  . A bad rash all over body  . Dizziness and weakness   Immunizations Administered    Name Date Dose VIS Date Route   Moderna COVID-19 Vaccine 02/22/2020 10:36 AM 0.5 mL 06/2019 Intramuscular   Manufacturer: Moderna   Lot: [id number]   NDC: 47654-650-35

## 2020-02-23 NOTE — Telephone Encounter (Signed)
Pt is calling again about the powered wheelchair. Pt stated she had [employer] now. Please advise.

## 2020-02-23 NOTE — Telephone Encounter (Signed)
Do you have order forms for the wheelchair?

## 2020-02-24 NOTE — Telephone Encounter (Signed)
Amy Gallagher whenever you get time can you see if you remember the place I will call and follow up with patient and insurance.

## 2020-02-24 NOTE — Telephone Encounter (Signed)
Had faxed this on 02/14/2020 with [facility] insurance attached to [employer] to fill the order it will have to be redone with appropriate insurance.

## 2020-02-24 NOTE — Telephone Encounter (Signed)
Pt can also call the [employer] and update her insurance that way which would probably be faster for her

## 2020-02-27 ENCOUNTER — Other Ambulatory Visit: Payer: Self-pay

## 2020-02-27 ENCOUNTER — Telehealth (INDEPENDENT_AMBULATORY_CARE_PROVIDER_SITE_OTHER): Payer: Medicare (Managed Care) | Admitting: Registered Nurse

## 2020-02-27 ENCOUNTER — Encounter: Payer: Self-pay | Admitting: Registered Nurse

## 2020-02-27 VITALS — Ht 64.0 in | Wt 185.0 lb

## 2020-02-27 DIAGNOSIS — M159 Polyosteoarthritis, unspecified: Secondary | ICD-10-CM

## 2020-02-27 DIAGNOSIS — M25561 Pain in right knee: Secondary | ICD-10-CM | POA: Diagnosis not present

## 2020-02-27 DIAGNOSIS — G8929 Other chronic pain: Secondary | ICD-10-CM

## 2020-02-27 DIAGNOSIS — M25562 Pain in left knee: Secondary | ICD-10-CM

## 2020-02-27 DIAGNOSIS — Z9181 History of falling: Secondary | ICD-10-CM | POA: Diagnosis not present

## 2020-02-27 DIAGNOSIS — Z7409 Other reduced mobility: Secondary | ICD-10-CM

## 2020-02-27 NOTE — Patient Instructions (Signed)
° ° ° °  If you have lab work done today you will be contacted with your lab results within the next 2 weeks.  If you have not heard from us then please contact us. The fastest way to get your results is to register for My Chart. ° ° °IF you received an x-ray today, you will receive an invoice from [facility]. Please contact [facility] at [phone] with questions or concerns regarding your invoice.  ° °IF you received labwork today, you will receive an invoice from [facility]. Please contact [facility] at [phone] with questions or concerns regarding your invoice.  ° °Our billing staff will not be able to assist you with questions regarding bills from these companies. ° °You will be contacted with the lab results as soon as they are available. The fastest way to get your results is to activate your My Chart account. Instructions are located on the last page of this paperwork. If you have not heard from us regarding the results in 2 weeks, please contact this office. °  ° ° ° °

## 2020-02-27 NOTE — Progress Notes (Signed)
Telemedicine Encounter- SOAP NOTE Established Patient  This telephone encounter was conducted with the patient's (or proxy's) verbal consent via audio telecommunications: yes  Patient was instructed to have this encounter in a suitably private space; and to only have persons present to whom they give permission to participate. In addition, patient identity was confirmed by use of name plus two identifiers (DOB and address).  I discussed the limitations, risks, security and privacy concerns of performing an evaluation and management service by telephone and the availability of in person appointments. I also discussed with the patient that there may be a patient responsible charge related to this service. The patient expressed understanding and agreed to proceed.  I spent a total of 15  talking with the patient or their proxy.  Patient is at home Provider is at home.  Chief Complaint  Patient presents with  . wheel chair  . Home health assisistance    Subjective   Amy Gallagher is a 86 y.o. established patient. Audiovisual visit today for follow up on home health and wheelchair   HPI Pt notes that her insurance unexpectedly changed and she needs a new referral to home health - needs PT and nursing services given her limited mobility, chronic pain, and fall risk. Unfortunately her children are not able to be home as full time caretakers for her. For the same reasons, she is hoping to have access to a wheelchair. Given her limited strength, she is optimistic for coverage for an electric chair - though we discussed that this is often dependent on what insurance deems necessary, I believe it would suit her best.   No other concerns. Pain has been better after a few sessions with PT. Feeling well overall, but "has ups and downs"  Patient Active Problem List   Diagnosis Date Noted  . Urinary urgency 06/10/2016  . Environmental allergies 08/14/2011  . Gout 09/10/2006  . DEPRESSION,  MAJOR, RECURRENT 09/10/2006  . Former smoker 09/10/2006  . HYPERTENSION, BENIGN SYSTEMIC 09/10/2006  . Mild intermittent asthma 09/10/2006  . REFLUX ESOPHAGITIS 09/10/2006  . Arthritis 09/10/2006    Past Medical History:  Diagnosis Date  . Arthritis   . Bronchitis   . Gout     Current Outpatient Medications  Medication Sig Dispense Refill  . albuterol (PROVENTIL) (2.5 MG/3ML) 0.083% nebulizer solution Take 3 mLs (2.5 mg total) by nebulization 2 (two) times daily as needed for wheezing or shortness of breath. 150 mL 1  . albuterol (VENTOLIN HFA) 108 (90 Base) MCG/ACT inhaler INHALE 2 PUFFS INTO THE LUNGS EVERY 4 HOURS AS NEEDED FOR WHEEZING OR SHORTNESS OF BREATH NOV 8 g 1  . allopurinol (ZYLOPRIM) 100 MG tablet Take 1 tablet (100 mg total) by mouth daily. 90 tablet 1  . ALPRAZolam (XANAX) 0.25 MG tablet Take 0.5 tablets (0.125 mg total) by mouth daily. 15 tablet 0  . benzonatate (TESSALON) 100 MG capsule Take 1-2 capsules (100-200 mg total) by mouth 3 (three) times daily as needed for cough. 40 capsule 1  . citalopram (CELEXA) 10 MG tablet Take 1 tablet (10 mg total) by mouth daily. 90 tablet 1  . Colchicine (MITIGARE) 0.6 MG CAPS Take 0.6 mg by mouth daily. Dispense as MITIGARE 90 capsule 1  . Dextromethorphan HBr 10 MG/15ML SYRP Take 15 mLs (10 mg total) by mouth daily. 120 mL 3  . fluticasone (FLONASE) 50 MCG/ACT nasal spray SPRAY 2 SPRAYS INTO EACH NOSTRIL EVERY DAY 16 mL 1  . fluticasone (FLOVENT  HFA) 44 MCG/ACT inhaler Inhale 2 puffs into the lungs 2 (two) times daily. 1 Inhaler 5  . gabapentin (NEURONTIN) 100 MG capsule Take 1 capsule (100 mg total) by mouth 3 (three) times daily. 90 capsule 3  . hydrochlorothiazide (HYDRODIURIL) 25 MG tablet Take 1 tablet (25 mg total) by mouth daily. 90 tablet 1  . Misc. Devices (COMMODE) MISC Please dispense one rail for commode. ICD10 Z74.09 1 each 0  . Misc. Devices [facility]) MISC 1 Device by Does not apply route daily. 1 each 0  .  Multiple Vitamin (MULTIVITAMIN WITH MINERALS) TABS tablet Take 1 tablet by mouth daily.    . pantoprazole (PROTONIX) 40 MG tablet TAKE 1 TABLET BY MOUTH EVERY DAY 90 tablet 1  . traMADol (ULTRAM) 50 MG tablet TAKE 1 TABLET BY MOUTH EVERY MORNING 30 tablet 0  . trolamine salicylate (ASPERCREME/ALOE) 10 % cream Apply 1 application topically as needed for muscle pain. 85 g 0  . vitamin C (ASCORBIC ACID) 500 MG tablet Take 1 tablet (500 mg total) by mouth daily. 90 tablet 3   No current facility-administered medications for this visit.    Allergies  Allergen Reactions  . Aspirin Nausea And Vomiting  . Penicillins Swelling    Social History   Socioeconomic History  . Marital status: Widowed    Spouse name: Not on file  . Number of children: Not on file  . Years of education: Not on file  . Highest education level: Not on file  Occupational History  . Not on file  Tobacco Use  . Smoking status: Former Smoker    Types: Cigarettes    Quit date: 02/26/1997    Years since quitting: 23.0  . Smokeless tobacco: Never Used  Substance and Sexual Activity  . Alcohol use: No  . Drug use: No  . Sexual activity: Not on file  Other Topics Concern  . Not on file  Social History Narrative   Patient cares for her mentally 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    Walks with a rolling walker.    Social Determinants of Health   Financial Resource Strain:   . Difficulty of Paying Living Expenses:   Food Insecurity:   . Worried About Programme researcher, broadcasting/film/video in the Last Year:   . Barista in the Last Year:   Transportation Needs:   . Freight forwarder (Medical):   Marland Kitchen Lack of Transportation (Non-Medical):   Physical Activity:   . Days of Exercise per Week:   . Minutes of Exercise per Session:   Stress:   . Feeling of Stress :   Social Connections:   . Frequency of Communication with Friends and Family:   . Frequency of Social Gatherings with Friends  and Family:   . Attends Religious Services:   . Active Member of Clubs or Organizations:   . Attends Banker Meetings:   Marland Kitchen Marital Status:   Intimate Partner Violence:   . Fear of Current or Ex-Partner:   . Emotionally Abused:   Marland Kitchen Physically Abused:   . Sexually Abused:     ROS Pertinent negatives and positives as detailed in HPI.  Objective   Vitals as reported by the patient: Today's Vitals   02/27/20 1159  Weight: 185 lb (83.9 kg)  Height: 5\' 4"  (1.626 m)    There are no diagnoses linked to this encounter.   I discussed the assessment and treatment plan  with the patient. The patient was provided an opportunity to ask questions and all were answered. The patient agreed with the plan and demonstrated an understanding of the instructions.   The patient was advised to call back or seek an in-person evaluation if the symptoms worsen or if the condition fails to improve as anticipated.  I provided 15 minutes of non-face-to-face time during this encounter.  [name], NP  Primary Care at [facility]

## 2020-02-29 NOTE — Telephone Encounter (Signed)
Amy Gallagher called her and told her this morning order was placed and she needed to call to sort out the order and her new insurance as we do not have up to date insurance information and her calling directly would be quicker.

## 2020-02-29 NOTE — Telephone Encounter (Signed)
Spoke with the patient she is calling [facility] to update her insurance information because she went form [employer] to [place].

## 2020-02-29 NOTE — Telephone Encounter (Signed)
Pt called back about power wheel chair / Pt wanted to check the status and see if order was placed / please advise asap /Pt stated it has been since June working on this/ please advise pt if order was faxed with correct insurance info

## 2020-03-01 ENCOUNTER — Telehealth: Payer: Self-pay | Admitting: Registered Nurse

## 2020-03-01 NOTE — Telephone Encounter (Signed)
Home Health Verbal Orders - Caller/Agency: Katie// [facility] Callback Number: [phone] secure  Requesting OT/PT/Skilled Nursing/Social Work/Speech Therapy: Skilled Nursing  Frequency: 1x for 9wks  General Assessment, Medication education

## 2020-03-01 NOTE — Telephone Encounter (Signed)
Spoke with Dr Kateri Plummer om Monday and he state he would put in a request for a wheel chair with [facility] told patient to call us and let us know that we need to provide with a manual wheel chair because it is going to take a while to get a power wheel chair   Pat suggest we call [facility]  [phone]   That they have been working with Elease Hashimoto

## 2020-03-04 ENCOUNTER — Other Ambulatory Visit: Payer: Self-pay | Admitting: Registered Nurse

## 2020-03-04 DIAGNOSIS — J45909 Unspecified asthma, uncomplicated: Secondary | ICD-10-CM

## 2020-03-05 NOTE — Telephone Encounter (Deleted)
Patient is requesting a refill of the following medications: Requested Prescriptions   Pending Prescriptions Disp Refills  . fluticasone (FLONASE) 50 MCG/ACT nasal spray [Pharmacy Med Name: FLUTICASONE PROP 50 MCG SPRAY] 48 mL 1    Sig: SPRAY 2 SPRAYS INTO EACH NOSTRIL EVERY DAY    Date of patient request: *** Last office visit: *** Date of last refill: *** Last refill amount: *** Follow up time period per chart: ***

## 2020-03-05 NOTE — Telephone Encounter (Signed)
Can you order manual wheel chair for her as it will take time to get motorized one

## 2020-03-05 NOTE — Telephone Encounter (Signed)
Called and gave the verbal orders to home health.

## 2020-03-06 ENCOUNTER — Telehealth: Payer: Self-pay | Admitting: Registered Nurse

## 2020-03-06 NOTE — Telephone Encounter (Signed)
Called [place] back and gave verbal orders.

## 2020-03-06 NOTE — Telephone Encounter (Signed)
Pt is wanting an update on  Her request for a  wheel chair. Please advise at [phone].

## 2020-03-06 NOTE — Telephone Encounter (Signed)
Home Health Verbal Orders - Caller/Agency: [facility]/ [facility]  Callback Number: [phone] Requesting OT Frequency: 2x's a week for 4 weeks

## 2020-03-07 NOTE — Telephone Encounter (Signed)
Spoke with morrow and he is going to give Ms Rutkowski a call in regards to the wheelchair.

## 2020-03-09 ENCOUNTER — Other Ambulatory Visit: Payer: Self-pay | Admitting: Registered Nurse

## 2020-03-09 DIAGNOSIS — Z9181 History of falling: Secondary | ICD-10-CM

## 2020-03-09 DIAGNOSIS — G8929 Other chronic pain: Secondary | ICD-10-CM

## 2020-03-09 DIAGNOSIS — M25562 Pain in left knee: Secondary | ICD-10-CM

## 2020-03-09 DIAGNOSIS — Z7409 Other reduced mobility: Secondary | ICD-10-CM

## 2020-03-09 DIAGNOSIS — E54 Ascorbic acid deficiency: Secondary | ICD-10-CM

## 2020-03-09 NOTE — Telephone Encounter (Signed)
Spoke with [facility] and got the wheel chair sorted sounds like they will be able to get her Automated wheel chair to her there was a Hold on her account for unknown reason got this removed and someone from Delivery will be returning my call in order to inform us when she can be expecting this and I will follow up with ms Clearance Coots once I have that information to give her.

## 2020-03-09 NOTE — Telephone Encounter (Signed)
Yes, absolutely. I'll reprint the order momentarily, it will be ready for pick up ASAP  Thank you  [name], NP

## 2020-03-09 NOTE — Progress Notes (Signed)
Pt needs order for manual wheelchair until ins approve electric chair  [name], NP

## 2020-03-13 ENCOUNTER — Telehealth: Payer: Self-pay | Admitting: Registered Nurse

## 2020-03-13 NOTE — Telephone Encounter (Signed)
Forward to correct pool

## 2020-03-13 NOTE — Telephone Encounter (Signed)
Amy Gallagher is calling from home health to report her pulse is 51 and possible UTI needing a verbal to collect u rine and drop off at lab     Amy Gallagher [phone]

## 2020-03-14 ENCOUNTER — Encounter: Payer: Self-pay | Admitting: Registered Nurse

## 2020-03-14 ENCOUNTER — Other Ambulatory Visit: Payer: Self-pay

## 2020-03-14 ENCOUNTER — Telehealth (INDEPENDENT_AMBULATORY_CARE_PROVIDER_SITE_OTHER): Payer: Medicare (Managed Care) | Admitting: Registered Nurse

## 2020-03-14 DIAGNOSIS — Z7409 Other reduced mobility: Secondary | ICD-10-CM | POA: Diagnosis not present

## 2020-03-14 DIAGNOSIS — M25562 Pain in left knee: Secondary | ICD-10-CM | POA: Diagnosis not present

## 2020-03-14 DIAGNOSIS — Z9181 History of falling: Secondary | ICD-10-CM

## 2020-03-14 DIAGNOSIS — M25561 Pain in right knee: Secondary | ICD-10-CM | POA: Diagnosis not present

## 2020-03-14 DIAGNOSIS — G8929 Other chronic pain: Secondary | ICD-10-CM

## 2020-03-14 NOTE — Patient Instructions (Signed)
     If you have lab work done today you will be contacted with your lab results within the next 2 weeks.  If you have not heard from [place] then please contact us. The fastest way to get your results is to register for My Chart.   IF you received an x-ray today, you will receive an invoice from [facility]. Please contact [facility] at [phone] with questions or concerns regarding your invoice.   IF you received labwork today, you will receive an invoice from [facility]. Please contact [facility] at [phone] with questions or concerns regarding your invoice.   Our billing staff will not be able to assist you with questions regarding bills from these companies.  You will be contacted with the lab results as soon as they are available. The fastest way to get your results is to activate your My Chart account. Instructions are located on the last page of this paperwork. If you have not heard from [place] regarding the results in 2 weeks, please contact this office.     0

## 2020-03-14 NOTE — Telephone Encounter (Signed)
Pt called to cancel [facility] she doesn't need it her daughter going to help her.please advice

## 2020-03-14 NOTE — Progress Notes (Addendum)
Telemedicine Encounter- SOAP NOTE Established Patient  This telephone encounter was conducted with the patient's (or proxy's) verbal consent via audio telecommunications: yes  Patient was instructed to have this encounter in a suitably private space; and to only have persons present to whom they give permission to participate. In addition, patient identity was confirmed by use of name plus two identifiers (DOB and address).  I discussed the limitations, risks, security and privacy concerns of performing an evaluation and management service by telephone and the availability of in person appointments. I also discussed with the patient that there may be a patient responsible charge related to this service. The patient expressed understanding and agreed to proceed.  I spent a total of 23 minutes talking with the patient or their proxy.  Patient at home Provider in office   Chief Complaint  Patient presents with  . Follow-up    patient states she would like to discuss a medication called Rinvoq she seen on tv. Per patient she had a fall yesterday but it was not bad. She still is waiting on a wheelchair,  . Medication Refill    Subjective   Amy Gallagher is a 86 y.o. established patient. Telephone visit today for update on medical condition, medical questions  HPI Pt has received approval for power chair through insurance. Waiting on DME company to deliver. Expected in next 1-2 days. Social work is helping her with this process - if they are unable to get this soon, will seek alternate DME company.  Has questions regarding Rinvoq. Seen ads for this on TV. Discussed that this is a fairly "heavy duty" medication that we do not tend to prescribe in primary care due to the monitoring required. Additionally, it can be quite expensive. While after a cursory look into the medication, I'm not sure that Amy Gallagher would be a candidate for this, we discussed that we could look into a referral for  her if she desired. She declines at this time but will reach out should she change her mind.  Does note that she had a brief mechanical fall yesterday. No head injury. No ongoing injury, just some mild soreness. States she tripped and lost her balance. She is frustrated by this because of her pain and the ongoing difficulty she is experiencing in getting a wheelchair. Has been interested in continuing to work with home health and PT for increasing strength and ROM.  Otherwise no concerns.   Patient Active Problem List   Diagnosis Date Noted  . Urinary urgency 06/10/2016  . Environmental allergies 08/14/2011  . Gout 09/10/2006  . DEPRESSION, MAJOR, RECURRENT 09/10/2006  . Former smoker 09/10/2006  . HYPERTENSION, BENIGN SYSTEMIC 09/10/2006  . Mild intermittent asthma 09/10/2006  . REFLUX ESOPHAGITIS 09/10/2006  . Arthritis 09/10/2006    Past Medical History:  Diagnosis Date  . Arthritis   . Bronchitis   . Gout     Current Outpatient Medications  Medication Sig Dispense Refill  . albuterol (PROVENTIL) (2.5 MG/3ML) 0.083% nebulizer solution Take 3 mLs (2.5 mg total) by nebulization 2 (two) times daily as needed for wheezing or shortness of breath. 150 mL 1  . albuterol (VENTOLIN HFA) 108 (90 Base) MCG/ACT inhaler INHALE 2 PUFFS INTO THE LUNGS EVERY 4 HOURS AS NEEDED FOR WHEEZING OR SHORTNESS OF BREATH NOV 8 g 1  . allopurinol (ZYLOPRIM) 100 MG tablet Take 1 tablet (100 mg total) by mouth daily. 90 tablet 1  . ALPRAZolam (XANAX) 0.25 MG tablet Take  0.5 tablets (0.125 mg total) by mouth daily. 15 tablet 0  . benzonatate (TESSALON) 100 MG capsule Take 1-2 capsules (100-200 mg total) by mouth 3 (three) times daily as needed for cough. 40 capsule 1  . citalopram (CELEXA) 10 MG tablet Take 1 tablet (10 mg total) by mouth daily. 90 tablet 1  . Colchicine (MITIGARE) 0.6 MG CAPS Take 0.6 mg by mouth daily. Dispense as MITIGARE 90 capsule 1  . [employer] VITAMIN C 500 MG tablet TAKE 1 TABLET BY MOUTH  EVERY DAY 90 tablet 3  . Dextromethorphan HBr 10 MG/15ML SYRP Take 15 mLs (10 mg total) by mouth daily. 120 mL 3  . fluticasone (FLONASE) 50 MCG/ACT nasal spray SPRAY 2 SPRAYS INTO EACH NOSTRIL EVERY DAY 48 mL 1  . fluticasone (FLOVENT HFA) 44 MCG/ACT inhaler Inhale 2 puffs into the lungs 2 (two) times daily. 1 Inhaler 5  . gabapentin (NEURONTIN) 100 MG capsule Take 1 capsule (100 mg total) by mouth 3 (three) times daily. 90 capsule 3  . hydrochlorothiazide (HYDRODIURIL) 25 MG tablet Take 1 tablet (25 mg total) by mouth daily. 90 tablet 1  . Misc. Devices (COMMODE) MISC Please dispense one rail for commode. ICD10 Z74.09 1 each 0  . Misc. Devices [facility]) MISC 1 Device by Does not apply route daily. 1 each 0  . Multiple Vitamin (MULTIVITAMIN WITH MINERALS) TABS tablet Take 1 tablet by mouth daily.    . pantoprazole (PROTONIX) 40 MG tablet TAKE 1 TABLET BY MOUTH EVERY DAY 90 tablet 1  . traMADol (ULTRAM) 50 MG tablet TAKE 1 TABLET BY MOUTH EVERY MORNING 30 tablet 0  . trolamine salicylate (ASPERCREME/ALOE) 10 % cream Apply 1 application topically as needed for muscle pain. 85 g 0   No current facility-administered medications for this visit.    Allergies  Allergen Reactions  . Aspirin Nausea And Vomiting  . Penicillins Swelling    Social History   Socioeconomic History  . Marital status: Widowed    Spouse name: Not on file  . Number of children: Not on file  . Years of education: Not on file  . Highest education level: Not on file  Occupational History  . Not on file  Tobacco Use  . Smoking status: Former Smoker    Types: Cigarettes    Quit date: 02/26/1997    Years since quitting: 23.0  . Smokeless tobacco: Never Used  Substance and Sexual Activity  . Alcohol use: No  . Drug use: No  . Sexual activity: Not on file  Other Topics Concern  . Not on file  Social History Narrative   Patient cares for her mentally retarded daughter, son and grandson     Patient's husband is  deceased.    Walks with a rolling walker.    Social Determinants of Health   Financial Resource Strain:   . Difficulty of Paying Living Expenses: Not on file  Food Insecurity:   . Worried About Programme researcher, broadcasting/film/video in the Last Year: Not on file  . Ran Out of Food in the Last Year: Not on file  Transportation Needs:   . Lack of Transportation (Medical): Not on file  . Lack of Transportation (Non-Medical): Not on file  Physical Activity:   . Days of Exercise per Week: Not on file  . Minutes of Exercise per Session: Not on file  Stress:   . Feeling of Stress : Not on file  Social Connections:   . Frequency of Communication with Friends  and Family: Not on file  . Frequency of Social Gatherings with Friends and Family: Not on file  . Attends Religious Services: Not on file  . Active Member of Clubs or Organizations: Not on file  . Attends Banker Meetings: Not on file  . Marital Status: Not on file  Intimate Partner Violence:   . Fear of Current or Ex-Partner: Not on file  . Emotionally Abused: Not on file  . Physically Abused: Not on file  . Sexually Abused: Not on file    Review of Systems  Constitutional: Negative.   HENT: Negative.   Eyes: Negative.   Respiratory: Negative.   Cardiovascular: Negative.   Gastrointestinal: Negative.   Genitourinary: Negative.   Musculoskeletal: Negative.   Skin: Negative.   Neurological: Negative.   Endo/Heme/Allergies: Negative.   Psychiatric/Behavioral: Negative.     Objective   Vitals as reported by the patient: There were no vitals filed for this visit.  Betzy was seen today for follow-up and medication refill.  Diagnoses and all orders for this visit:  Chronic pain of both knees  Limited mobility  At high risk for injury related to fall   PLAN  Will continue to work with patient regarding her pain and mobility issues  Discussed that PT is likely necessary for optimal care even when we do get a  wheelchair  Discussed long term care as an option - pt has no interest in this at this time.  Family is supportive of patient and provides the help that they can  Patient encouraged to call clinic with any questions, comments, or concerns.  I discussed the assessment and treatment plan with the patient. The patient was provided an opportunity to ask questions and all were answered. The patient agreed with the plan and demonstrated an understanding of the instructions.   The patient was advised to call back or seek an in-person evaluation if the symptoms worsen or if the condition fails to improve as anticipated.  I provided 23 minutes of non-face-to-face time during this encounter.  [name], NP  Primary Care at [facility]

## 2020-03-14 NOTE — Telephone Encounter (Signed)
Patient has an Telemedicine today at 4:50pm

## 2020-03-20 ENCOUNTER — Telehealth: Payer: Self-pay | Admitting: Registered Nurse

## 2020-03-20 NOTE — Telephone Encounter (Signed)
LMTCB I am not sure what is still holding this up order was removed from hold and according to [facility] it was in process to be delivered to her Has she called [facility] to update her insurance? If yes she needs to contact them again find out what the hold up is and let us know. I have spoken with [employer] on several occasions and they state theres no issue with the order.

## 2020-03-20 NOTE — Telephone Encounter (Signed)
Patient is calling back again regarding her manual wheel chair request . Last messae stated it clear on our end and pt is still awaiting response from [facility]  Pt requested that [place] call her back regarding this

## 2020-03-26 NOTE — Telephone Encounter (Signed)
Patient checking on the status of message mentioned below,  patient will have [employer] call directly if further information is needed.

## 2020-03-27 ENCOUNTER — Telehealth: Payer: Self-pay | Admitting: Registered Nurse

## 2020-03-27 NOTE — Telephone Encounter (Signed)
Jerilyn calling fr om [facility] needing verbal for PT 1 time a week for 1 week . 2 times a week for 4 weeks for  For knee pain  Please call   [phone]

## 2020-03-27 NOTE — Telephone Encounter (Signed)
Patient was informed there is no issues with patient order. Patient was advised to call [employer] to see what is the hold up. Not sure if she need to update her ins but she can call them to find out and if there is anything else we need to do then give [place] a call back. Patient understood and will be given [employer] a call

## 2020-03-28 NOTE — Telephone Encounter (Signed)
Pt called and stated [facility] gave her this fax number for our office. [phone]. Pt stated there is wheelchair that she can have so that is the fax number to send it to. Please advise.

## 2020-03-28 NOTE — Telephone Encounter (Signed)
Called Jerilyn calling at [facility] and gave verbal orders for pt.

## 2020-03-28 NOTE — Telephone Encounter (Signed)
Pt stated she was left a VM from [place]. Pt would like a call back when possible. Please advise.

## 2020-03-29 ENCOUNTER — Other Ambulatory Visit: Payer: Self-pay | Admitting: Registered Nurse

## 2020-03-29 DIAGNOSIS — Z9181 History of falling: Secondary | ICD-10-CM

## 2020-03-29 DIAGNOSIS — Z7409 Other reduced mobility: Secondary | ICD-10-CM

## 2020-03-29 DIAGNOSIS — G8929 Other chronic pain: Secondary | ICD-10-CM

## 2020-03-29 NOTE — Telephone Encounter (Signed)
Pt called stated that Nehemiah Settle gave her a call yesterday and was calling back. Let pt know that she was out of the office today but should be in tomorrow. Please advise.

## 2020-03-29 NOTE — Telephone Encounter (Signed)
Placed in the Red to be signed box for you and I have already written the Fax cover form so it can be sent immediately

## 2020-03-29 NOTE — Telephone Encounter (Signed)
Excellent. I'll print this to the office momentarily and can sign it when I arrive tomorrow. It will be ready for pick up at that time.  Thea Silversmith - if you wouldn't mind putting it in the "Red" box in the providers lounge for me, that would be great.  Thank you  [name], NP

## 2020-03-29 NOTE — Telephone Encounter (Signed)
Called and Spoke with [facility] on [address] per [place] request, they stated they do have the wheel chair available. They state they need and order saying the Dx code and "size 18 manual wheel chair with normal leg rests." and we can fax this they will process this for her

## 2020-03-30 ENCOUNTER — Telehealth: Payer: Self-pay | Admitting: Registered Nurse

## 2020-03-30 NOTE — Telephone Encounter (Signed)
Paperwork has been faxed and confirmation has been given to [facility]

## 2020-03-30 NOTE — Telephone Encounter (Signed)
Called and informed patient that the forms where faxed over to [facility].

## 2020-03-30 NOTE — Telephone Encounter (Signed)
Pt wants to let Nehemiah Settle know that wheelchair has  2 different options one with leg rest / pt wants to know if that would benefit her knee trouble

## 2020-03-30 NOTE — Telephone Encounter (Signed)
LVM for pt regarding her question about the wheelchair that would better help her knee.

## 2020-04-02 ENCOUNTER — Ambulatory Visit: Payer: Medicaid Other

## 2020-04-03 NOTE — Telephone Encounter (Signed)
Pt is calling in checking on status of this message. Please advise. She  recently had a fall, she really needs her wheelchair.

## 2020-04-04 NOTE — Telephone Encounter (Signed)
Spoke with pt regarding her concerns about her wheelchair

## 2020-04-04 NOTE — Telephone Encounter (Signed)
I have spoken to the pt and to [facility] regarding this and the paperwork has been faxed.

## 2020-04-23 NOTE — Telephone Encounter (Signed)
Closing chart, no action required 

## 2020-04-26 ENCOUNTER — Telehealth: Payer: Self-pay | Admitting: Registered Nurse

## 2020-04-26 NOTE — Telephone Encounter (Signed)
Home Health Verbal Orders - Caller/Agency: [facility]  Callback Number: [phone] Requesting OT/PT/Skilled Nursing/Social Work/Speech Therapy: Pt cancelled PT appt, because of funeral  Frequency: Needs to reschedule

## 2020-04-27 NOTE — Telephone Encounter (Signed)
I have called [facility] back and he stated that they will have to discharge pt since she will not be able to do any PT for the next couple of weeks.

## 2020-05-07 ENCOUNTER — Telehealth: Payer: Self-pay

## 2020-05-07 ENCOUNTER — Other Ambulatory Visit: Payer: Self-pay | Admitting: Registered Nurse

## 2020-05-07 ENCOUNTER — Other Ambulatory Visit: Payer: Self-pay

## 2020-05-07 DIAGNOSIS — M549 Dorsalgia, unspecified: Secondary | ICD-10-CM

## 2020-05-07 DIAGNOSIS — R8290 Unspecified abnormal findings in urine: Secondary | ICD-10-CM

## 2020-05-07 NOTE — Telephone Encounter (Signed)
Called to speak with pt regarding why urine was dropped off. Pt says she is having back and and bad urine odor. PCP will will send meds to treat uti

## 2020-05-08 ENCOUNTER — Telehealth: Payer: Self-pay | Admitting: Registered Nurse

## 2020-05-08 NOTE — Telephone Encounter (Signed)
Pt had POCT yesterday that indicated UTI you had spoken with Anitra regarding this and had said you would send in medication can you please do this or advise other next action. Thank you

## 2020-05-08 NOTE — Telephone Encounter (Signed)
I sent it for a culture - no medication yet until culture comes back   Thanks  [name], NP

## 2020-05-08 NOTE — Telephone Encounter (Signed)
Pt called and she stated that she was suppose to be sent medication to the pharmacy from pts urine sample that she dropped off. Pt would like a call when this is sent in. Please advise.

## 2020-05-08 NOTE — Telephone Encounter (Signed)
Pt informed

## 2020-05-09 ENCOUNTER — Ambulatory Visit: Payer: Self-pay | Admitting: *Deleted

## 2020-05-09 ENCOUNTER — Ambulatory Visit: Payer: Medicaid Other | Admitting: Registered Nurse

## 2020-05-09 NOTE — Telephone Encounter (Signed)
Patient called to request lab results. No urine results noted at this time. Patient c/o increasing leg pain. Bilateral legs ache from knees to hips. Taking tylenol and it is ineffective. Patient requesting a different medication that will help with leg pain. Reports physical therapy helps but does not keep the pain away. Difficulty sleeping due to pain. Patient does not want appt due to difficulty with mobility getting into office via w/c. Care advise given. Patient verbalized understanding of care advise and to call back or go to ED if symptoms worsen. Please advise for additional medication for pain management .   Reason for Disposition  [1] MODERATE pain (e.g., interferes with normal activities, limping) AND [2] present > 3 days  Answer Assessment - Initial Assessment Questions 1. ONSET: "When did the pain start?"      Years ago  2. LOCATION: "Where is the pain located?"      Bilateral knees  3. PAIN: "How bad is the pain?"    (Scale 1-10; or mild, moderate, severe)   -  MILD (1-3): doesn't interfere with normal activities    -  MODERATE (4-7): interferes with normal activities (e.g., work or school) or awakens from sleep, limping    -  SEVERE (8-10): excruciating pain, unable to do any normal activities, unable to walk     Moderate  4. WORK OR EXERCISE: "Has there been any recent work or exercise that involved this part of the body?"      Hit against dresser on above knee  5. CAUSE: "What do you think is causing the leg pain?"     Arthritis  6. OTHER SYMPTOMS: "Do you have any other symptoms?" (e.g., chest pain, back pain, breathing difficulty, swelling, rash, fever, numbness, weakness)      No  7. PREGNANCY: "Is there any chance you are pregnant?" "When was your last menstrual period?"     na  Protocols used: LEG PAIN-A-AH

## 2020-05-10 NOTE — Telephone Encounter (Signed)
Pt requesting results from urine

## 2020-05-10 NOTE — Telephone Encounter (Signed)
Still awaiting sensitivities. In regards to pain medication - patient has been referred to pain clinic given her age and high fall risk. Looks like it's still in progress. [facility] looks like they'll be assuming care soon  Thank you  [name], NP

## 2020-05-14 ENCOUNTER — Telehealth: Payer: Self-pay | Admitting: Registered Nurse

## 2020-05-14 NOTE — Telephone Encounter (Signed)
Pain is not comfortable at all urinating

## 2020-05-14 NOTE — Telephone Encounter (Signed)
Pt called to speak to Sherlyn Lees  nurse  Patient is wanting someone to go over her Urine results again.

## 2020-05-15 ENCOUNTER — Other Ambulatory Visit: Payer: Self-pay | Admitting: Registered Nurse

## 2020-05-15 DIAGNOSIS — G8929 Other chronic pain: Secondary | ICD-10-CM | POA: Insufficient documentation

## 2020-05-15 DIAGNOSIS — Z9181 History of falling: Secondary | ICD-10-CM | POA: Insufficient documentation

## 2020-05-15 DIAGNOSIS — Z7409 Other reduced mobility: Secondary | ICD-10-CM | POA: Insufficient documentation

## 2020-05-15 DIAGNOSIS — N3000 Acute cystitis without hematuria: Secondary | ICD-10-CM

## 2020-05-15 LAB — URINE CULTURE

## 2020-05-15 MED ORDER — LEVOFLOXACIN 250 MG PO TABS: 250.0000 mg | ORAL_TABLET | Freq: Every day | ORAL | 0 refills | Status: DC

## 2020-05-15 NOTE — Telephone Encounter (Signed)
PT calling to F/UP / please advise

## 2020-05-15 NOTE — Telephone Encounter (Signed)
Spoke to [employer] regarding sending in med for this pt. Says he will send in medication once he looks at urine results

## 2020-05-15 NOTE — Progress Notes (Signed)
Culture is back Levaquin sent - 250mg  PO qd for four days. This is due to her penicillin allergy which rules out cephalosporins, the bactrim resistance, and her age making macrobid an inopportune choice.   Thanks  , NP

## 2020-05-28 ENCOUNTER — Other Ambulatory Visit: Payer: Self-pay

## 2020-05-28 ENCOUNTER — Telehealth: Payer: Self-pay

## 2020-05-28 DIAGNOSIS — J45909 Unspecified asthma, uncomplicated: Secondary | ICD-10-CM

## 2020-05-28 DIAGNOSIS — R062 Wheezing: Secondary | ICD-10-CM

## 2020-05-28 MED ORDER — ALBUTEROL SULFATE HFA 108 (90 BASE) MCG/ACT IN AERS: 2.0000 | INHALATION_SPRAY | RESPIRATORY_TRACT | 1 refills | Status: DC | PRN

## 2020-05-28 NOTE — Telephone Encounter (Signed)
Pt. Called requesting refill on albuterol

## 2020-05-28 NOTE — Telephone Encounter (Signed)
Pt informed her albuterol inhaler has been sent in to pharmacy.

## 2020-05-30 ENCOUNTER — Telehealth (INDEPENDENT_AMBULATORY_CARE_PROVIDER_SITE_OTHER): Payer: Medicare (Managed Care) | Admitting: Registered Nurse

## 2020-05-30 ENCOUNTER — Telehealth: Payer: Self-pay

## 2020-05-30 ENCOUNTER — Other Ambulatory Visit: Payer: Self-pay

## 2020-05-30 DIAGNOSIS — M159 Polyosteoarthritis, unspecified: Secondary | ICD-10-CM

## 2020-05-30 MED ORDER — ACETAMINOPHEN-CODEINE #2 300-15 MG PO TABS: 1.0000 | ORAL_TABLET | Freq: Two times a day (BID) | ORAL | 1 refills | Status: DC | PRN

## 2020-05-30 NOTE — Patient Instructions (Signed)
° ° ° °  If you have lab work done today you will be contacted with your lab results within the next 2 weeks.  If you have not heard from us then please contact us. The fastest way to get your results is to register for My Chart. ° ° °IF you received an x-ray today, you will receive an invoice from [facility]. Please contact [facility] at [phone] with questions or concerns regarding your invoice.  ° °IF you received labwork today, you will receive an invoice from [facility]. Please contact [facility] at [phone] with questions or concerns regarding your invoice.  ° °Our billing staff will not be able to assist you with questions regarding bills from these companies. ° °You will be contacted with the lab results as soon as they are available. The fastest way to get your results is to activate your My Chart account. Instructions are located on the last page of this paperwork. If you have not heard from us regarding the results in 2 weeks, please contact this office. °  ° ° ° °

## 2020-05-30 NOTE — Telephone Encounter (Signed)
Copied from CRM [id number]. Topic: General - Other >> May 30, 2020 12:29 PM Marylen Ponto wrote: Reason for CRM: Amy Gallagher with [facility] stated Medicaid forms were faxed to Amy Gallagher on 05/17/20 and 05/25/20 requesting his signature and the date but they have yet to receive the signed and dated form back. Cb# [phone]

## 2020-06-01 NOTE — Telephone Encounter (Signed)
Have you seen this paperwork. I have looked up front and have not see it.

## 2020-06-04 NOTE — Telephone Encounter (Signed)
Paperwork was faxed over

## 2020-06-12 ENCOUNTER — Telehealth: Payer: Self-pay

## 2020-06-12 ENCOUNTER — Telehealth: Payer: Self-pay | Admitting: Registered Nurse

## 2020-06-12 NOTE — Telephone Encounter (Signed)
Pt wants to let Nehemiah Settle know that she was supposed to get some lidocaine pain patches for her knee / and pharmacy does not have an order for pain patches / I was not able to locate on med list  Patient uses pharmacy  [id number] [name], [place] - [address]  [address] [name] [address]  Phone:  [phone] Fax:  [phone]  DEA #:  [id number]  Please advise

## 2020-06-12 NOTE — Telephone Encounter (Signed)
Pt states was supposed to receive lidocaine patches do not see an order please advise

## 2020-06-21 ENCOUNTER — Other Ambulatory Visit: Payer: Self-pay | Admitting: Registered Nurse

## 2020-06-21 DIAGNOSIS — M159 Polyosteoarthritis, unspecified: Secondary | ICD-10-CM

## 2020-06-21 MED ORDER — LIDOCAINE 5 % EX PTCH: 1.0000 | MEDICATED_PATCH | CUTANEOUS | 0 refills | Status: DC

## 2020-06-21 NOTE — Telephone Encounter (Signed)
Pt checking on status of this message. Her knees ane very sore and painful. Please advise at [phone].

## 2020-06-25 ENCOUNTER — Telehealth: Payer: Self-pay | Admitting: Registered Nurse

## 2020-06-25 NOTE — Telephone Encounter (Signed)
Pt called stating that she has not been able to pick up  her lidocaine patches due to her insurance not covering them. She states that they were too expensive and is requesting to have something else sent in for her. Please advise.      [id number] [name], [place] - [address]  [address] [place] [address]  Phone: [phone] Fax: [phone]  Hours: Not open 24 hours

## 2020-06-26 NOTE — Telephone Encounter (Signed)
Patient states that she is currently on Lidocaine patches and its too expensive due to her insurance not covering the expenses patient would like an alternative. Please Advise

## 2020-06-29 NOTE — Telephone Encounter (Signed)
Pt checking on status of this message.  Please advise at [phone].  This is very urgent.

## 2020-07-01 ENCOUNTER — Other Ambulatory Visit: Payer: Self-pay | Admitting: Registered Nurse

## 2020-07-01 DIAGNOSIS — M159 Polyosteoarthritis, unspecified: Secondary | ICD-10-CM

## 2020-07-01 MED ORDER — LIDOCAINE 5 % EX OINT: 1.0000 "application " | TOPICAL_OINTMENT | CUTANEOUS | 0 refills | Status: DC | PRN

## 2020-07-01 NOTE — Telephone Encounter (Signed)
Have sent topical lidocaine instead. Looks like it should be covered  Thanks  Retail banker

## 2020-07-03 NOTE — Telephone Encounter (Signed)
Called patient yesterday and left a detailed message stating that a prescription was sent to her pharmacy.

## 2020-07-11 ENCOUNTER — Telehealth: Payer: Self-pay | Admitting: Registered Nurse

## 2020-07-11 NOTE — Telephone Encounter (Signed)
Pt is requesting a PA for Lidocaine patches, but I only see ointment in his chart. Please advise.

## 2020-07-11 NOTE — Telephone Encounter (Signed)
Patient has been calling needing him to give authorization to get Lidocaine patches for knees/ pharmacy tells them they are waiting for authorization     What is the name of the medication pt is unsure   Have you contacted your pharmacy to request a refill? y  Which pharmacy would you like this sent to [id number] [name], [place] - [address]  [address] [name] [address]  Phone:  [phone] Fax:  [phone]  DEA #:  [id number]    Patient notified that their request is being sent to the clinical staff for review and that they should receive a call once it is complete. If they do not receive a call within 72 hours they can check with their pharmacy or our office.

## 2020-07-18 ENCOUNTER — Telehealth: Payer: Self-pay | Admitting: *Deleted

## 2020-07-18 NOTE — Telephone Encounter (Signed)
07/18/2020 - [facility] CALL[facility] TO SAY PATIENT REALLY NEEDS TO GET THE LIDOCAINE PATCHES DUE TO BEING IN A LOT OF PAIN. SHE SAID SHE HAS BEEN WAITING SINCE 07/11/2020. PLEASE SEE JULIE'S MESSAGE FROM 07/18/2020 BUT PATIENT WANTED THIS TO BE SPEEDED UP IF POSSIBLE. RICH MORROW'S PATIENT. BEST PHONE FOR PATIENT IS: [phone]  MBC

## 2020-07-18 NOTE — Telephone Encounter (Signed)
Faxed updated paperwork to [facility] for lidocaine patches. Waiting on decision

## 2020-07-18 NOTE — Telephone Encounter (Signed)
Patient checking on the status of Lidocaine patches and would like a follow up call today. Patient states PCP is aware she has experienced in the past an allergic reaction from the ointment / gel therefore requesting patches and would like request expedited.

## 2020-07-19 NOTE — Telephone Encounter (Signed)
I spoke with insurance company today they are still denying even with the reaction to ointment.    One appeal has already been denied.  Another appeal was sent .   Unfortunately we are doing everything we can on our end it is her insurance company that will not cover it.

## 2020-07-26 NOTE — Telephone Encounter (Signed)
Amy Gallagher tried doing PA on lidocaine patches and they are simply not covering them is there something else she can have in place of?

## 2020-07-26 NOTE — Telephone Encounter (Signed)
Pt called stating that [facility] has told her they cannot take care of these patches and is requesting to know if there is anything else that could possibly be sent in for her. Please advise.

## 2020-07-27 NOTE — Telephone Encounter (Signed)
We're opting for OTC lidocaine at this time - I believe Raynelle Fanning has contacted patient  Thanks,  Luan Pulling

## 2020-07-29 ENCOUNTER — Other Ambulatory Visit: Payer: Self-pay | Admitting: Registered Nurse

## 2020-07-29 DIAGNOSIS — J45909 Unspecified asthma, uncomplicated: Secondary | ICD-10-CM

## 2020-07-29 DIAGNOSIS — R062 Wheezing: Secondary | ICD-10-CM

## 2020-08-03 ENCOUNTER — Telehealth: Payer: Self-pay | Admitting: Registered Nurse

## 2020-08-03 NOTE — Telephone Encounter (Signed)
Virtual is okay if she has no other option would prefer physical evaluation for the injury she C/O

## 2020-08-03 NOTE — Telephone Encounter (Signed)
New rx of this type require a visit for evaluation of symptoms thank you

## 2020-08-03 NOTE — Telephone Encounter (Signed)
What is the name of the medication? A muscle relaxer.   Have you contacted your pharmacy to request a refill? Pt thinks she pulled a muscle in her back washing dishes the other day. I tried to make an appointment for her, she wants to see if Kateri Plummer would send a muscle relaxer to   Which pharmacy would you like this sent to? Pharmacy  [facility] [address] [place] - [address]  [address] [place] [address]  Phone:  [phone] Fax:  [phone]  DEA #:  [id number]      Patient notified that their request is being sent to the clinical staff for review and that they should receive a call once it is complete. If they do not receive a call within 72 hours they can check with their pharmacy or our office.

## 2020-08-03 NOTE — Telephone Encounter (Signed)
Called pt to sch appt due to lack of transportation she asked for a virtual appt. Stated that we could do that because of her situations but we might need her to come into the office to possible have lab work. Please advise.

## 2020-08-06 ENCOUNTER — Other Ambulatory Visit: Payer: Self-pay

## 2020-08-06 ENCOUNTER — Telehealth (INDEPENDENT_AMBULATORY_CARE_PROVIDER_SITE_OTHER): Payer: Medicare (Managed Care) | Admitting: Registered Nurse

## 2020-08-06 DIAGNOSIS — J45909 Unspecified asthma, uncomplicated: Secondary | ICD-10-CM

## 2020-08-06 DIAGNOSIS — R062 Wheezing: Secondary | ICD-10-CM

## 2020-08-06 DIAGNOSIS — M159 Polyosteoarthritis, unspecified: Secondary | ICD-10-CM

## 2020-08-06 MED ORDER — FLOVENT HFA 44 MCG/ACT IN AERO: 2.0000 | INHALATION_SPRAY | Freq: Two times a day (BID) | RESPIRATORY_TRACT | 5 refills | Status: DC

## 2020-08-06 MED ORDER — PREDNISONE 5 MG PO TABS: 5.0000 mg | ORAL_TABLET | Freq: Every day | ORAL | 0 refills | Status: DC

## 2020-08-06 MED ORDER — ALBUTEROL SULFATE HFA 108 (90 BASE) MCG/ACT IN AERS: INHALATION_SPRAY | RESPIRATORY_TRACT | 11 refills | Status: DC

## 2020-08-06 NOTE — Patient Instructions (Signed)
° ° ° °  If you have lab work done today you will be contacted with your lab results within the next 2 weeks.  If you have not heard from us then please contact us. The fastest way to get your results is to register for My Chart. ° ° °IF you received an x-ray today, you will receive an invoice from [facility]. Please contact  Radiology at [phone] with questions or concerns regarding your invoice.  ° °IF you received labwork today, you will receive an invoice from [facility]. Please contact [facility] at [phone] with questions or concerns regarding your invoice.  ° °Our billing staff will not be able to assist you with questions regarding bills from these companies. ° °You will be contacted with the lab results as soon as they are available. The fastest way to get your results is to activate your My Chart account. Instructions are located on the last page of this paperwork. If you have not heard from us regarding the results in 2 weeks, please contact this office. °  ° ° ° °

## 2020-08-08 NOTE — Progress Notes (Signed)
Telemedicine Encounter- SOAP NOTE Established Patient  This telephone encounter was conducted with the patient's (or proxy's) verbal consent via audio telecommunications: yes  Patient was instructed to have this encounter in a suitably private space; and to only have persons present to whom they give permission to participate. In addition, patient identity was confirmed by use of name plus two identifiers (DOB and address).  I discussed the limitations, risks, security and privacy concerns of performing an evaluation and management service by telephone and the availability of in person appointments. I also discussed with the patient that there may be a patient responsible charge related to this service. The patient expressed understanding and agreed to proceed.  I spent a total of 16 minutes talking with the patient or their proxy.  Patient at home Provider in office  Chief Complaint  Patient presents with  . Hip Pain    patient states she is having some left hip pain. Patient states she thinks its still from the fall she had about 4 weeks ago and the pain is getting worse and seems like its getting worse and making it hard to get up,    Subjective   Amy Gallagher is a 87 y.o. established patient. Telephone visit today for hip pain  HPI Had a mechanical fall around 4 weeks ago. Was able to get up on her own. Some bruising but full ROM intact, no numbness or tingling, no saddle symptoms. Now states pain has not resolved as she expected it to She acknowledges that she does have a hx of polyarticular OA that is likely impacting this She has a longstanding history of declining mobility due to her OA and pain, and by now, likely some deconditioning for which we had set up home health PT. This has been going ok.  Patient Active Problem List   Diagnosis Date Noted  . Chronic pain of both knees 05/15/2020  . Limited mobility 05/15/2020  . At high risk for injury related to fall  05/15/2020  . Urinary urgency 06/10/2016  . Environmental allergies 08/14/2011  . Gout 09/10/2006  . DEPRESSION, MAJOR, RECURRENT 09/10/2006  . Former smoker 09/10/2006  . HYPERTENSION, BENIGN SYSTEMIC 09/10/2006  . Mild intermittent asthma 09/10/2006  . REFLUX ESOPHAGITIS 09/10/2006  . Arthritis 09/10/2006    Past Medical History:  Diagnosis Date  . Arthritis   . Bronchitis   . Gout     Current Outpatient Medications  Medication Sig Dispense Refill  . albuterol (PROVENTIL) (2.5 MG/3ML) 0.083% nebulizer solution Take 3 mLs (2.5 mg total) by nebulization 2 (two) times daily as needed for wheezing or shortness of breath. 150 mL 1  . allopurinol (ZYLOPRIM) 100 MG tablet Take 1 tablet (100 mg total) by mouth daily. 90 tablet 1  . ALPRAZolam (XANAX) 0.25 MG tablet Take 0.5 tablets (0.125 mg total) by mouth daily. 15 tablet 0  . benzonatate (TESSALON) 100 MG capsule Take 1-2 capsules (100-200 mg total) by mouth 3 (three) times daily as needed for cough. 40 capsule 1  . citalopram (CELEXA) 10 MG tablet Take 1 tablet (10 mg total) by mouth daily. 90 tablet 1  . Colchicine (MITIGARE) 0.6 MG CAPS Take 0.6 mg by mouth daily. Dispense as MITIGARE 90 capsule 1  . [facility]S VITAMIN C 500 MG tablet TAKE 1 TABLET BY MOUTH EVERY DAY 90 tablet 3  . Dextromethorphan HBr 10 MG/15ML SYRP Take 15 mLs (10 mg total) by mouth daily. 120 mL 3  . fluticasone (FLONASE) 50  MCG/ACT nasal spray SPRAY 2 SPRAYS INTO EACH NOSTRIL EVERY DAY 48 mL 1  . hydrochlorothiazide (HYDRODIURIL) 25 MG tablet Take 1 tablet (25 mg total) by mouth daily. 90 tablet 1  . Misc. Devices (COMMODE) MISC Please dispense one rail for commode. ICD10 Z74.09 1 each 0  . Misc. Devices [facility]) MISC 1 Device by Does not apply route daily. 1 each 0  . Multiple Vitamin (MULTIVITAMIN WITH MINERALS) TABS tablet Take 1 tablet by mouth daily.    . pantoprazole (PROTONIX) 40 MG tablet TAKE 1 TABLET BY MOUTH EVERY DAY 90 tablet 1  . trolamine  salicylate (ASPERCREME/ALOE) 10 % cream Apply 1 application topically as needed for muscle pain. 85 g 0  . acetaminophen-codeine (TYLENOL #2) 300-15 MG tablet Take 1 tablet by mouth 2 (two) times daily as needed for moderate pain or severe pain. 60 tablet 1  . albuterol (VENTOLIN HFA) 108 (90 Base) MCG/ACT inhaler INHALE 2 PUFFS INTO THE LUNGS EVERY 4 HOURS AS NEEDED FOR WHEEZE OR FOR SHORTNESS OF BREATH 18 each 11  . fluticasone (FLOVENT HFA) 44 MCG/ACT inhaler Inhale 2 puffs into the lungs 2 (two) times daily. 1 each 5  . gabapentin (NEURONTIN) 100 MG capsule Take 1 capsule (100 mg total) by mouth 3 (three) times daily. 90 capsule 3  . lidocaine (XYLOCAINE) 5 % ointment Apply 1 application topically as needed. 35.44 g 0  . predniSONE (DELTASONE) 5 MG tablet Take 1 tablet (5 mg total) by mouth daily with breakfast. 5 tablet 0  . traMADol (ULTRAM) 50 MG tablet TAKE 1 TABLET BY MOUTH EVERY MORNING 30 tablet 0   No current facility-administered medications for this visit.    Allergies  Allergen Reactions  . Aspirin Nausea And Vomiting  . Penicillins Swelling    Social History   Socioeconomic History  . Marital status: Widowed    Spouse name: Not on file  . Number of children: Not on file  . Years of education: Not on file  . Highest education level: Not on file  Occupational History  . Not on file  Tobacco Use  . Smoking status: Former Smoker    Types: Cigarettes    Quit date: 02/26/1997    Years since quitting: 23.4  . Smokeless tobacco: Never Used  Substance and Sexual Activity  . Alcohol use: No  . Drug use: No  . Sexual activity: Not on file  Other Topics Concern  . Not on file  Social History Narrative   Patient cares for her mentally 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    Walks with a rolling walker.    Social Determinants of Health   Financial Resource Strain: Not on file  Food Insecurity: Not on file  Transportation Needs: Not  on file  Physical Activity: Not on file  Stress: Not on file  Social Connections: Not on file  Intimate Partner Violence: Not on file    ROS Per hpi   Objective   Vitals as reported by the patient: There were no vitals filed for this visit.  Kelle was seen today for hip pain.  Diagnoses and all orders for this visit:  Osteoarthritis of multiple joints, unspecified osteoarthritis type -     acetaminophen-codeine (TYLENOL #2) 300-15 MG tablet; Take 1 tablet by mouth 2 (two) times daily as needed for moderate pain or severe pain.   PLAN  Will send short course of tylenol with codeine to supplement existing pain regimen.  Discussed risks of using opioid pain relieves in older adults thoroughly with patient, who demonstrated understanding of these risks.  Patient encouraged to call clinic with any questions, comments, or concerns.   I discussed the assessment and treatment plan with the patient. The patient was provided an opportunity to ask questions and all were answered. The patient agreed with the plan and demonstrated an understanding of the instructions.   The patient was advised to call back or seek an in-person evaluation if the symptoms worsen or if the condition fails to improve as anticipated.  I provided 16 minutes of non-face-to-face time during this encounter.  [name], NP  Primary Care at [facility]

## 2020-08-18 ENCOUNTER — Other Ambulatory Visit: Payer: Self-pay | Admitting: Registered Nurse

## 2020-08-18 DIAGNOSIS — J45909 Unspecified asthma, uncomplicated: Secondary | ICD-10-CM

## 2020-08-20 ENCOUNTER — Telehealth: Payer: Self-pay | Admitting: Registered Nurse

## 2020-08-20 NOTE — Telephone Encounter (Signed)
Pt wants to know if she can take advanced advil for the Tylenol as she states still having pain

## 2020-08-20 NOTE — Telephone Encounter (Signed)
Patient is calling asking if it would be ok to substitute advanced  Advil for arthritis instead of Tylenol 8 .patient states she is in a lot of pain   Please advise

## 2020-08-22 ENCOUNTER — Other Ambulatory Visit: Payer: Self-pay

## 2020-08-22 ENCOUNTER — Ambulatory Visit: Payer: Medicare Other | Attending: Internal Medicine

## 2020-08-22 DIAGNOSIS — Z23 Encounter for immunization: Secondary | ICD-10-CM

## 2020-08-22 NOTE — Progress Notes (Signed)
   Covid-19 Vaccination Clinic  Name:  Amy Gallagher    MRN: [id number] DOB: 1929/09/30  08/22/2020  [name] was observed post Covid-19 immunization for 15 minutes without incident. She was provided with Vaccine Information Sheet and instruction to access the V-Safe system.   [name] was instructed to call 911 with any severe reactions post vaccine: Marland Kitchen Difficulty breathing  . Swelling of face and throat  . A fast heartbeat  . A bad rash all over body  . Dizziness and weakness   Immunizations Administered    Name Date Dose VIS Date Route   Moderna Covid-19 Booster Vaccine 08/22/2020 12:17 PM 0.25 mL 05/02/2020 Intramuscular   Manufacturer: Moderna   Lot: [id number]   NDC: 58527-782-42

## 2020-09-11 NOTE — Telephone Encounter (Signed)
No action needed at this time, closing open encounter 

## 2020-10-25 NOTE — Telephone Encounter (Signed)
Opened in error

## 2020-10-26 DIAGNOSIS — Z741 Need for assistance with personal care: Secondary | ICD-10-CM | POA: Diagnosis not present

## 2020-10-26 DIAGNOSIS — R3915 Urgency of urination: Secondary | ICD-10-CM | POA: Diagnosis not present

## 2020-11-03 DIAGNOSIS — Z7409 Other reduced mobility: Secondary | ICD-10-CM | POA: Diagnosis not present

## 2020-11-03 DIAGNOSIS — Z9181 History of falling: Secondary | ICD-10-CM | POA: Diagnosis not present

## 2020-11-03 DIAGNOSIS — M25569 Pain in unspecified knee: Secondary | ICD-10-CM | POA: Diagnosis not present

## 2020-11-03 DIAGNOSIS — M1389 Other specified arthritis, multiple sites: Secondary | ICD-10-CM | POA: Diagnosis not present

## 2020-11-22 ENCOUNTER — Telehealth: Payer: Self-pay | Admitting: Registered Nurse

## 2020-11-22 NOTE — Telephone Encounter (Signed)
Patient called about paperwork - (in home care) that should have been done before leaving [place] - Do you know anything about this?  Please advise patient

## 2020-11-23 NOTE — Telephone Encounter (Signed)
Called patient to inform. Patient voiced understanding. Fax number given so forms could be refaxed.

## 2020-11-23 NOTE — Telephone Encounter (Signed)
All paperwork at [facility] had been signed and faxed to my knowledge. I don't have anything with Ms. Violante's name on it at this time  Thank you  Luan Pulling

## 2020-11-29 DIAGNOSIS — Z741 Need for assistance with personal care: Secondary | ICD-10-CM | POA: Diagnosis not present

## 2020-11-29 DIAGNOSIS — R3915 Urgency of urination: Secondary | ICD-10-CM | POA: Diagnosis not present

## 2020-12-03 DIAGNOSIS — M1389 Other specified arthritis, multiple sites: Secondary | ICD-10-CM | POA: Diagnosis not present

## 2020-12-03 DIAGNOSIS — Z7409 Other reduced mobility: Secondary | ICD-10-CM | POA: Diagnosis not present

## 2020-12-03 DIAGNOSIS — Z9181 History of falling: Secondary | ICD-10-CM | POA: Diagnosis not present

## 2020-12-03 DIAGNOSIS — M25569 Pain in unspecified knee: Secondary | ICD-10-CM | POA: Diagnosis not present

## 2020-12-20 ENCOUNTER — Telehealth (INDEPENDENT_AMBULATORY_CARE_PROVIDER_SITE_OTHER): Payer: Medicare HMO | Admitting: Registered Nurse

## 2020-12-20 ENCOUNTER — Encounter: Payer: Self-pay | Admitting: Registered Nurse

## 2020-12-20 ENCOUNTER — Other Ambulatory Visit: Payer: Self-pay | Admitting: Registered Nurse

## 2020-12-20 ENCOUNTER — Other Ambulatory Visit: Payer: Self-pay

## 2020-12-20 DIAGNOSIS — Z9181 History of falling: Secondary | ICD-10-CM

## 2020-12-20 DIAGNOSIS — M159 Polyosteoarthritis, unspecified: Secondary | ICD-10-CM | POA: Diagnosis not present

## 2020-12-20 DIAGNOSIS — M25562 Pain in left knee: Secondary | ICD-10-CM

## 2020-12-20 DIAGNOSIS — J45909 Unspecified asthma, uncomplicated: Secondary | ICD-10-CM

## 2020-12-20 DIAGNOSIS — M549 Dorsalgia, unspecified: Secondary | ICD-10-CM

## 2020-12-20 DIAGNOSIS — R062 Wheezing: Secondary | ICD-10-CM

## 2020-12-20 DIAGNOSIS — M25561 Pain in right knee: Secondary | ICD-10-CM

## 2020-12-20 DIAGNOSIS — Z7409 Other reduced mobility: Secondary | ICD-10-CM | POA: Diagnosis not present

## 2020-12-20 DIAGNOSIS — G8929 Other chronic pain: Secondary | ICD-10-CM

## 2020-12-20 MED ORDER — FLUTICASONE PROPIONATE HFA 44 MCG/ACT IN AERO: 2.0000 | INHALATION_SPRAY | Freq: Two times a day (BID) | RESPIRATORY_TRACT | 5 refills | Status: DC

## 2020-12-20 MED ORDER — PREDNISONE 5 MG PO TABS: 5.0000 mg | ORAL_TABLET | Freq: Every day | ORAL | 0 refills | Status: DC

## 2020-12-20 MED ORDER — ACETAMINOPHEN-CODEINE #2 300-15 MG PO TABS: 1.0000 | ORAL_TABLET | Freq: Two times a day (BID) | ORAL | 1 refills | Status: DC | PRN

## 2020-12-20 NOTE — Patient Instructions (Signed)
° ° ° °  If you have lab work done today you will be contacted with your lab results within the next 2 weeks.  If you have not heard from us then please contact us. The fastest way to get your results is to register for My Chart. ° ° °IF you received an x-ray today, you will receive an invoice from [facility]. Please contact [facility] at [phone] with questions or concerns regarding your invoice.  ° °IF you received labwork today, you will receive an invoice from [facility]. Please contact [facility] at [phone] with questions or concerns regarding your invoice.  ° °Our billing staff will not be able to assist you with questions regarding bills from these companies. ° °You will be contacted with the lab results as soon as they are available. The fastest way to get your results is to activate your My Chart account. Instructions are located on the last page of this paperwork. If you have not heard from us regarding the results in 2 weeks, please contact this office. °  ° ° ° °

## 2020-12-20 NOTE — Progress Notes (Signed)
Telemedicine Encounter- SOAP NOTE Established Patient  This telephone encounter was conducted with the patient's (or proxy's) verbal consent via audio telecommunications: yes/no: Yes  Patient was instructed to have this encounter in a suitably private space; and to only have persons present to whom they give permission to participate. In addition, patient identity was confirmed by use of name plus two identifiers (DOB and address).  I discussed the limitations, risks, security and privacy concerns of performing an evaluation and management service by telephone and the availability of in person appointments. I also discussed with the patient that there may be a patient responsible charge related to this service. The patient expressed understanding and agreed to proceed.  I spent a total of TIME; 0 MIN TO 60 MIN: 15 minutes talking with the patient or their proxy.  Chief Complaint  Patient presents with   Knee Pain    Patient states she has been having some knee pain and arm pain now. Patient states she feels like she is starting to go down hill. Also the wheelchair is too small.[facility] has not sent a person to help due to waiting on provider.   Medication Refill    Patient also needs a medication refill on Promethazine.    Subjective   Amy Gallagher is a 87 y.o. established patient. Telephone visit today for ongoing pain  HPI Pain both knees and now in both arms Pt states she feels like her overall health is in a state of decline Unfortunately her wheelchair is too small - not comfortable for her to sit.  She has participated in home health in the past with some good effect, but unfortunately this is on hold. According to patient, they are awaiting orders from our office.  Refill: promethazine. No acute concerns. Tolerates well.   Patient Active Problem List   Diagnosis Date Noted   Chronic pain of both knees 05/15/2020   Limited mobility 05/15/2020   At high risk for  injury related to fall 05/15/2020   Urinary urgency 06/10/2016   Environmental allergies 08/14/2011   Gout 09/10/2006   DEPRESSION, MAJOR, RECURRENT 09/10/2006   Former smoker 09/10/2006   HYPERTENSION, BENIGN SYSTEMIC 09/10/2006   Mild intermittent asthma 09/10/2006   REFLUX ESOPHAGITIS 09/10/2006   Arthritis 09/10/2006    Past Medical History:  Diagnosis Date   Arthritis    Bronchitis    Gout     Current Outpatient Medications  Medication Sig Dispense Refill   albuterol (PROVENTIL) (2.5 MG/3ML) 0.083% nebulizer solution Take 3 mLs (2.5 mg total) by nebulization 2 (two) times daily as needed for wheezing or shortness of breath. 150 mL 1   albuterol (VENTOLIN HFA) 108 (90 Base) MCG/ACT inhaler INHALE 2 PUFFS INTO THE LUNGS EVERY 4 HOURS AS NEEDED FOR WHEEZE OR FOR SHORTNESS OF BREATH 18 each 11   allopurinol (ZYLOPRIM) 100 MG tablet Take 1 tablet (100 mg total) by mouth daily. 90 tablet 1   ALPRAZolam (XANAX) 0.25 MG tablet Take 0.5 tablets (0.125 mg total) by mouth daily. 15 tablet 0   benzonatate (TESSALON) 100 MG capsule Take 1-2 capsules (100-200 mg total) by mouth 3 (three) times daily as needed for cough. 40 capsule 1   citalopram (CELEXA) 10 MG tablet Take 1 tablet (10 mg total) by mouth daily. 90 tablet 1   Colchicine (MITIGARE) 0.6 MG CAPS Take 0.6 mg by mouth daily. Dispense as MITIGARE 90 capsule 1   [employer]S VITAMIN C 500 MG tablet TAKE 1 TABLET  BY MOUTH EVERY DAY 90 tablet 3   Dextromethorphan HBr 10 MG/15ML SYRP Take 15 mLs (10 mg total) by mouth daily. 120 mL 3   fluticasone (FLONASE) 50 MCG/ACT nasal spray SPRAY 2 SPRAYS INTO EACH NOSTRIL EVERY DAY 48 mL 1   fluticasone (FLOVENT HFA) 44 MCG/ACT inhaler Inhale 2 puffs into the lungs 2 (two) times daily. 1 each 5   gabapentin (NEURONTIN) 100 MG capsule Take 1 capsule (100 mg total) by mouth 3 (three) times daily. 90 capsule 3   hydrochlorothiazide (HYDRODIURIL) 25 MG tablet Take 1 tablet (25 mg total) by mouth daily. 90  tablet 1   lidocaine (XYLOCAINE) 5 % ointment Apply 1 application topically as needed. 35.44 g 0   Misc. Devices (COMMODE) MISC Please dispense one rail for commode. ICD10 Z74.09 1 each 0   Misc. Devices [facility]) MISC 1 Device by Does not apply route daily. 1 each 0   Multiple Vitamin (MULTIVITAMIN WITH MINERALS) TABS tablet Take 1 tablet by mouth daily.     pantoprazole (PROTONIX) 40 MG tablet TAKE 1 TABLET BY MOUTH EVERY DAY 90 tablet 1   traMADol (ULTRAM) 50 MG tablet TAKE 1 TABLET BY MOUTH EVERY MORNING 30 tablet 0   trolamine salicylate (ASPERCREME/ALOE) 10 % cream Apply 1 application topically as needed for muscle pain. 85 g 0   acetaminophen-codeine (TYLENOL #2) 300-15 MG tablet Take 1 tablet by mouth 2 (two) times daily as needed for moderate pain or severe pain. 60 tablet 1   predniSONE (DELTASONE) 5 MG tablet Take 1 tablet (5 mg total) by mouth daily with breakfast. 5 tablet 0   No current facility-administered medications for this visit.    Allergies  Allergen Reactions   Aspirin Nausea And Vomiting   Penicillins Swelling    Social History   Socioeconomic History   Marital status: Widowed    Spouse name: Not on file   Number of children: Not on file   Years of education: Not on file   Highest education level: Not on file  Occupational History   Not on file  Tobacco Use   Smoking status: Former    Pack years: 0.00    Types: Cigarettes    Quit date: 02/26/1997    Years since quitting: 23.8   Smokeless tobacco: Never  Substance and Sexual Activity   Alcohol use: No   Drug use: No   Sexual activity: Not on file  Other Topics Concern   Not on file  Social History Narrative   Patient cares for her mentally 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    Walks with a rolling walker.    Social Determinants of Health   Financial Resource Strain: Not on file  Food Insecurity: Not on file  Transportation Needs: Not on file  Physical  Activity: Not on file  Stress: Not on file  Social Connections: Not on file  Intimate Partner Violence: Not on file    Review of Systems  Constitutional: Negative.   HENT: Negative.    Eyes: Negative.   Respiratory: Negative.    Cardiovascular: Negative.   Gastrointestinal: Negative.   Genitourinary: Negative.   Musculoskeletal: Negative.   Skin: Negative.   Neurological: Negative.   Endo/Heme/Allergies: Negative.   Psychiatric/Behavioral: Negative.    All other systems reviewed and are negative.  Objective   Vitals as reported by the patient: There were no vitals filed for this visit.  Lynn was seen today for knee  pain and medication refill.  Diagnoses and all orders for this visit:  Wheezing -     predniSONE (DELTASONE) 5 MG tablet; Take 1 tablet (5 mg total) by mouth daily with breakfast.  Osteoarthritis of multiple joints, unspecified osteoarthritis type -     predniSONE (DELTASONE) 5 MG tablet; Take 1 tablet (5 mg total) by mouth daily with breakfast. -     acetaminophen-codeine (TYLENOL #2) 300-15 MG tablet; Take 1 tablet by mouth 2 (two) times daily as needed for moderate pain or severe pain.   PLAN Prednisone as above Ok to refill tylenol with codeine. Reviewed risks, benefits, and AE of this medication with pt who voices understanding Patient encouraged to call clinic with any questions, comments, or concerns.  I discussed the assessment and treatment plan with the patient. The patient was provided an opportunity to ask questions and all were answered. The patient agreed with the plan and demonstrated an understanding of the instructions.   The patient was advised to call back or seek an in-person evaluation if the symptoms worsen or if the condition fails to improve as anticipated.  I provided 15 minutes of non-face-to-face time during this encounter.  [name], NP

## 2020-12-26 DIAGNOSIS — Z741 Need for assistance with personal care: Secondary | ICD-10-CM | POA: Diagnosis not present

## 2020-12-26 DIAGNOSIS — R3915 Urgency of urination: Secondary | ICD-10-CM | POA: Diagnosis not present

## 2020-12-26 DIAGNOSIS — F339 Major depressive disorder, recurrent, unspecified: Secondary | ICD-10-CM | POA: Diagnosis not present

## 2020-12-26 DIAGNOSIS — J4520 Mild intermittent asthma, uncomplicated: Secondary | ICD-10-CM | POA: Diagnosis not present

## 2020-12-26 DIAGNOSIS — Z7951 Long term (current) use of inhaled steroids: Secondary | ICD-10-CM | POA: Diagnosis not present

## 2020-12-26 DIAGNOSIS — G8929 Other chronic pain: Secondary | ICD-10-CM | POA: Diagnosis not present

## 2020-12-26 DIAGNOSIS — M109 Gout, unspecified: Secondary | ICD-10-CM | POA: Diagnosis not present

## 2020-12-26 DIAGNOSIS — M170 Bilateral primary osteoarthritis of knee: Secondary | ICD-10-CM | POA: Diagnosis not present

## 2020-12-26 DIAGNOSIS — I10 Essential (primary) hypertension: Secondary | ICD-10-CM | POA: Diagnosis not present

## 2020-12-26 DIAGNOSIS — K2100 Gastro-esophageal reflux disease with esophagitis, without bleeding: Secondary | ICD-10-CM | POA: Diagnosis not present

## 2020-12-26 DIAGNOSIS — Z7952 Long term (current) use of systemic steroids: Secondary | ICD-10-CM | POA: Diagnosis not present

## 2021-01-02 DIAGNOSIS — M109 Gout, unspecified: Secondary | ICD-10-CM | POA: Diagnosis not present

## 2021-01-02 DIAGNOSIS — F339 Major depressive disorder, recurrent, unspecified: Secondary | ICD-10-CM | POA: Diagnosis not present

## 2021-01-02 DIAGNOSIS — J4520 Mild intermittent asthma, uncomplicated: Secondary | ICD-10-CM | POA: Diagnosis not present

## 2021-01-02 DIAGNOSIS — G8929 Other chronic pain: Secondary | ICD-10-CM | POA: Diagnosis not present

## 2021-01-02 DIAGNOSIS — K2100 Gastro-esophageal reflux disease with esophagitis, without bleeding: Secondary | ICD-10-CM | POA: Diagnosis not present

## 2021-01-02 DIAGNOSIS — I10 Essential (primary) hypertension: Secondary | ICD-10-CM | POA: Diagnosis not present

## 2021-01-02 DIAGNOSIS — Z7951 Long term (current) use of inhaled steroids: Secondary | ICD-10-CM | POA: Diagnosis not present

## 2021-01-02 DIAGNOSIS — Z7952 Long term (current) use of systemic steroids: Secondary | ICD-10-CM | POA: Diagnosis not present

## 2021-01-02 DIAGNOSIS — M170 Bilateral primary osteoarthritis of knee: Secondary | ICD-10-CM | POA: Diagnosis not present

## 2021-01-03 DIAGNOSIS — M25569 Pain in unspecified knee: Secondary | ICD-10-CM | POA: Diagnosis not present

## 2021-01-03 DIAGNOSIS — Z7409 Other reduced mobility: Secondary | ICD-10-CM | POA: Diagnosis not present

## 2021-01-03 DIAGNOSIS — M1389 Other specified arthritis, multiple sites: Secondary | ICD-10-CM | POA: Diagnosis not present

## 2021-01-03 DIAGNOSIS — Z9181 History of falling: Secondary | ICD-10-CM | POA: Diagnosis not present

## 2021-01-07 ENCOUNTER — Telehealth: Payer: Self-pay | Admitting: Registered Nurse

## 2021-01-07 NOTE — Telephone Encounter (Signed)
..  Home Health Certification or Plan of Care Tracking  Is this a Certification or Plan of Care?  Yes  HH Agency:  [facility]  Order Number:  [id number]  Has charge sheet been attached? yes  Where has form been placed:   In [name]'s bin up front

## 2021-01-08 DIAGNOSIS — F339 Major depressive disorder, recurrent, unspecified: Secondary | ICD-10-CM | POA: Diagnosis not present

## 2021-01-08 DIAGNOSIS — J4520 Mild intermittent asthma, uncomplicated: Secondary | ICD-10-CM | POA: Diagnosis not present

## 2021-01-08 DIAGNOSIS — K2100 Gastro-esophageal reflux disease with esophagitis, without bleeding: Secondary | ICD-10-CM | POA: Diagnosis not present

## 2021-01-08 DIAGNOSIS — G8929 Other chronic pain: Secondary | ICD-10-CM | POA: Diagnosis not present

## 2021-01-08 DIAGNOSIS — Z7951 Long term (current) use of inhaled steroids: Secondary | ICD-10-CM | POA: Diagnosis not present

## 2021-01-08 DIAGNOSIS — M170 Bilateral primary osteoarthritis of knee: Secondary | ICD-10-CM | POA: Diagnosis not present

## 2021-01-08 DIAGNOSIS — I10 Essential (primary) hypertension: Secondary | ICD-10-CM | POA: Diagnosis not present

## 2021-01-08 DIAGNOSIS — M109 Gout, unspecified: Secondary | ICD-10-CM | POA: Diagnosis not present

## 2021-01-08 DIAGNOSIS — Z7952 Long term (current) use of systemic steroids: Secondary | ICD-10-CM | POA: Diagnosis not present

## 2021-01-10 NOTE — Telephone Encounter (Signed)
We may have received this again, couldn't find the original so I placed it in the bin again with a charge sheet

## 2021-01-16 ENCOUNTER — Telehealth: Payer: Self-pay | Admitting: Registered Nurse

## 2021-01-16 NOTE — Telephone Encounter (Signed)
..  Type of form received:Home Health Order  Additional comments:   Received QM:GNOIB  Form should be Faxed to:[phone]  Form should be mailed to:    Is patient requesting call for pickup:   Form placed:  In [name]'s bin up front  Attach charge sheet.  Provider will determine charge.  Individual made aware of 3-5 business day turn around (Y/N)?

## 2021-01-17 DIAGNOSIS — Z9181 History of falling: Secondary | ICD-10-CM

## 2021-01-17 DIAGNOSIS — Z87891 Personal history of nicotine dependence: Secondary | ICD-10-CM

## 2021-01-17 DIAGNOSIS — J4520 Mild intermittent asthma, uncomplicated: Secondary | ICD-10-CM

## 2021-01-17 DIAGNOSIS — K2100 Gastro-esophageal reflux disease with esophagitis, without bleeding: Secondary | ICD-10-CM

## 2021-01-17 DIAGNOSIS — Z7951 Long term (current) use of inhaled steroids: Secondary | ICD-10-CM

## 2021-01-17 DIAGNOSIS — M170 Bilateral primary osteoarthritis of knee: Secondary | ICD-10-CM

## 2021-01-17 DIAGNOSIS — Z7952 Long term (current) use of systemic steroids: Secondary | ICD-10-CM

## 2021-01-17 DIAGNOSIS — I10 Essential (primary) hypertension: Secondary | ICD-10-CM

## 2021-01-17 DIAGNOSIS — G8929 Other chronic pain: Secondary | ICD-10-CM

## 2021-01-17 DIAGNOSIS — F339 Major depressive disorder, recurrent, unspecified: Secondary | ICD-10-CM

## 2021-01-17 DIAGNOSIS — M109 Gout, unspecified: Secondary | ICD-10-CM

## 2021-01-21 ENCOUNTER — Ambulatory Visit: Payer: Medicare HMO | Attending: Critical Care Medicine

## 2021-01-21 DIAGNOSIS — Z23 Encounter for immunization: Secondary | ICD-10-CM

## 2021-01-21 NOTE — Progress Notes (Signed)
   Covid-19 Vaccination Clinic  Name:  Amy Gallagher    MRN: [id number] DOB: April 14, 1930  01/21/2021  [name] was observed post Covid-19 immunization for 15 minutes without incident. She was provided with Vaccine Information Sheet and instruction to access the V-Safe system.   [name] was instructed to call 911 with any severe reactions post vaccine: Difficulty breathing  Swelling of face and throat  A fast heartbeat  A bad rash all over body  Dizziness and weakness   Immunizations Administered     Name Date Dose VIS Date Route   Moderna Covid-19 Booster Vaccine 01/21/2021 12:40 PM 0.25 mL 05/02/2020 Intramuscular   Manufacturer: Moderna   Lot: [id number]   NDC: 77939-688-64

## 2021-01-23 DIAGNOSIS — Z7952 Long term (current) use of systemic steroids: Secondary | ICD-10-CM | POA: Diagnosis not present

## 2021-01-23 DIAGNOSIS — I10 Essential (primary) hypertension: Secondary | ICD-10-CM | POA: Diagnosis not present

## 2021-01-23 DIAGNOSIS — M109 Gout, unspecified: Secondary | ICD-10-CM | POA: Diagnosis not present

## 2021-01-23 DIAGNOSIS — G8929 Other chronic pain: Secondary | ICD-10-CM | POA: Diagnosis not present

## 2021-01-23 DIAGNOSIS — M170 Bilateral primary osteoarthritis of knee: Secondary | ICD-10-CM | POA: Diagnosis not present

## 2021-01-23 DIAGNOSIS — Z7951 Long term (current) use of inhaled steroids: Secondary | ICD-10-CM | POA: Diagnosis not present

## 2021-01-23 DIAGNOSIS — F339 Major depressive disorder, recurrent, unspecified: Secondary | ICD-10-CM | POA: Diagnosis not present

## 2021-01-23 DIAGNOSIS — J4520 Mild intermittent asthma, uncomplicated: Secondary | ICD-10-CM | POA: Diagnosis not present

## 2021-01-23 DIAGNOSIS — K2100 Gastro-esophageal reflux disease with esophagitis, without bleeding: Secondary | ICD-10-CM | POA: Diagnosis not present

## 2021-01-24 DIAGNOSIS — F339 Major depressive disorder, recurrent, unspecified: Secondary | ICD-10-CM | POA: Diagnosis not present

## 2021-01-24 DIAGNOSIS — K2100 Gastro-esophageal reflux disease with esophagitis, without bleeding: Secondary | ICD-10-CM | POA: Diagnosis not present

## 2021-01-24 DIAGNOSIS — M170 Bilateral primary osteoarthritis of knee: Secondary | ICD-10-CM | POA: Diagnosis not present

## 2021-01-24 DIAGNOSIS — Z7951 Long term (current) use of inhaled steroids: Secondary | ICD-10-CM | POA: Diagnosis not present

## 2021-01-24 DIAGNOSIS — G8929 Other chronic pain: Secondary | ICD-10-CM | POA: Diagnosis not present

## 2021-01-24 DIAGNOSIS — I10 Essential (primary) hypertension: Secondary | ICD-10-CM | POA: Diagnosis not present

## 2021-01-24 DIAGNOSIS — M109 Gout, unspecified: Secondary | ICD-10-CM | POA: Diagnosis not present

## 2021-01-24 DIAGNOSIS — Z7952 Long term (current) use of systemic steroids: Secondary | ICD-10-CM | POA: Diagnosis not present

## 2021-01-24 DIAGNOSIS — J4520 Mild intermittent asthma, uncomplicated: Secondary | ICD-10-CM | POA: Diagnosis not present

## 2021-01-28 DIAGNOSIS — R3915 Urgency of urination: Secondary | ICD-10-CM | POA: Diagnosis not present

## 2021-01-28 DIAGNOSIS — Z741 Need for assistance with personal care: Secondary | ICD-10-CM | POA: Diagnosis not present

## 2021-02-02 DIAGNOSIS — Z9181 History of falling: Secondary | ICD-10-CM | POA: Diagnosis not present

## 2021-02-02 DIAGNOSIS — M25569 Pain in unspecified knee: Secondary | ICD-10-CM | POA: Diagnosis not present

## 2021-02-02 DIAGNOSIS — M1389 Other specified arthritis, multiple sites: Secondary | ICD-10-CM | POA: Diagnosis not present

## 2021-02-02 DIAGNOSIS — Z7409 Other reduced mobility: Secondary | ICD-10-CM | POA: Diagnosis not present

## 2021-02-06 ENCOUNTER — Telehealth: Payer: Self-pay

## 2021-02-06 NOTE — Telephone Encounter (Signed)
[facility]([phone]) to see what patient needs to get an aide. No answer. Had to leave a voicemail message for staff to call me back.

## 2021-02-06 NOTE — Telephone Encounter (Signed)
Pt is wanting to know what she needs to do to get paperwork for Home Health Aid to come out. [facility] come by to speak to her for forms to be completed? She does not have forms [facility] only give her a number to send the completed forms to.   [facility] [phone]   Pt call back [phone]

## 2021-02-19 ENCOUNTER — Telehealth: Payer: Self-pay | Admitting: Registered Nurse

## 2021-02-19 NOTE — Telephone Encounter (Signed)
..  Home Health Certification or Plan of Care Tracking  Is this a Certification or Plan of Care?  yes  [facility] Agency:[facility]  Order Number:  [id number]  Has charge sheet been attached? yes  Where has form been placed:   In [name]'s bin up front

## 2021-02-26 NOTE — Telephone Encounter (Signed)
Tami has picked this up from the back and faxed to the # provided on the form and sent to scan.

## 2021-02-28 DIAGNOSIS — Z741 Need for assistance with personal care: Secondary | ICD-10-CM | POA: Diagnosis not present

## 2021-02-28 DIAGNOSIS — R3915 Urgency of urination: Secondary | ICD-10-CM | POA: Diagnosis not present

## 2021-03-01 ENCOUNTER — Other Ambulatory Visit: Payer: Self-pay | Admitting: Registered Nurse

## 2021-03-01 DIAGNOSIS — J45909 Unspecified asthma, uncomplicated: Secondary | ICD-10-CM

## 2021-03-01 DIAGNOSIS — R062 Wheezing: Secondary | ICD-10-CM

## 2021-03-05 DIAGNOSIS — M25569 Pain in unspecified knee: Secondary | ICD-10-CM | POA: Diagnosis not present

## 2021-03-05 DIAGNOSIS — Z7409 Other reduced mobility: Secondary | ICD-10-CM | POA: Diagnosis not present

## 2021-03-05 DIAGNOSIS — M1389 Other specified arthritis, multiple sites: Secondary | ICD-10-CM | POA: Diagnosis not present

## 2021-03-05 DIAGNOSIS — Z9181 History of falling: Secondary | ICD-10-CM | POA: Diagnosis not present

## 2021-03-07 ENCOUNTER — Telehealth: Payer: Self-pay

## 2021-03-07 NOTE — Telephone Encounter (Signed)
Patient called and said that you should receive a fax from the home health agency in about 30 minutes to an Hour.

## 2021-03-07 NOTE — Telephone Encounter (Signed)
Patient is calling home health to fax forms over that needs to be filled out anf faxed back.

## 2021-03-08 ENCOUNTER — Telehealth: Payer: Self-pay | Admitting: Registered Nurse

## 2021-03-08 NOTE — Telephone Encounter (Signed)
..  Type of form received:    Personal Care Need Assessment Form  Additional comments:   Received by:   Gus Height should be Faxed to:  [phone]  Form should be mailed to:    Is patient requesting call for pickup:   Form placed:  In [name]'s bin up front  Attach charge sheet.  Provider will determine charge.  Individual made aware of 3-5 business day turn around No?

## 2021-03-14 NOTE — Telephone Encounter (Signed)
Mrs. Amy Gallagher called back to check on status of paperwork. I let her know that we received this paperwork on the 26th of August and would check on the status with [name], NP. Mrs. Amy Gallagher would like a call back at [phone] once paperwork has been signed and faxed.

## 2021-03-15 ENCOUNTER — Telehealth: Payer: Self-pay

## 2021-03-15 NOTE — Telephone Encounter (Signed)
Update! I called and spoke with patient after speaking with provider. Per provider patient needs to be seen within nine days of form being filled. Patient is aware and is on schedule for a virtual visit on Tuesday 03/19/21.

## 2021-03-15 NOTE — Telephone Encounter (Signed)
Filled out and faxed yesterday  Thank you  Rich

## 2021-03-15 NOTE — Telephone Encounter (Signed)
[facility] Home Health Forms placed in [place]   Pt call back [phone]

## 2021-03-19 ENCOUNTER — Telehealth (INDEPENDENT_AMBULATORY_CARE_PROVIDER_SITE_OTHER): Payer: Medicare HMO | Admitting: Registered Nurse

## 2021-03-19 ENCOUNTER — Other Ambulatory Visit: Payer: Self-pay

## 2021-03-19 ENCOUNTER — Encounter: Payer: Self-pay | Admitting: Registered Nurse

## 2021-03-19 DIAGNOSIS — M25561 Pain in right knee: Secondary | ICD-10-CM

## 2021-03-19 DIAGNOSIS — Z7409 Other reduced mobility: Secondary | ICD-10-CM

## 2021-03-19 DIAGNOSIS — M549 Dorsalgia, unspecified: Secondary | ICD-10-CM | POA: Diagnosis not present

## 2021-03-19 DIAGNOSIS — G4700 Insomnia, unspecified: Secondary | ICD-10-CM | POA: Diagnosis not present

## 2021-03-19 DIAGNOSIS — G8929 Other chronic pain: Secondary | ICD-10-CM

## 2021-03-19 DIAGNOSIS — M159 Polyosteoarthritis, unspecified: Secondary | ICD-10-CM | POA: Diagnosis not present

## 2021-03-19 DIAGNOSIS — Z741 Need for assistance with personal care: Secondary | ICD-10-CM | POA: Diagnosis not present

## 2021-03-19 DIAGNOSIS — Z9181 History of falling: Secondary | ICD-10-CM

## 2021-03-19 DIAGNOSIS — M25562 Pain in left knee: Secondary | ICD-10-CM | POA: Diagnosis not present

## 2021-03-19 MED ORDER — METHOCARBAMOL 500 MG PO TABS: 250.0000 mg | ORAL_TABLET | Freq: Two times a day (BID) | ORAL | 0 refills | Status: DC | PRN

## 2021-03-19 MED ORDER — ACETAMINOPHEN-CODEINE #2 300-15 MG PO TABS: 1.0000 | ORAL_TABLET | Freq: Two times a day (BID) | ORAL | 1 refills | Status: DC | PRN

## 2021-03-19 MED ORDER — LIDOCAINE 5 % EX OINT
1.0000 | TOPICAL_OINTMENT | CUTANEOUS | 0 refills | Status: DC | PRN
Start: 2021-03-19 — End: 2022-02-12

## 2021-03-19 MED ORDER — ALPRAZOLAM 0.25 MG PO TABS: 0.1250 mg | ORAL_TABLET | Freq: Every evening | ORAL | 1 refills | Status: DC | PRN

## 2021-03-19 NOTE — Progress Notes (Signed)
Telemedicine Encounter- SOAP NOTE Established Patient  This telephone encounter was conducted with the patient's (or proxy's) verbal consent via audio telecommunications: yes/no: Yes Patient was instructed to have this encounter in a suitably private space; and to only have persons present to whom they give permission to participate. In addition, patient identity was confirmed by use of name plus two identifiers (DOB and address).  I discussed the limitations, risks, security and privacy concerns of performing an evaluation and management service by telephone and the availability of in person appointments. I also discussed with the patient that there may be a patient responsible charge related to this service. The patient expressed understanding and agreed to proceed.  I spent a total of 17 minutes talking with the patient or their proxy.  Patient at home Provider in office  Participants: [name], NP and Gus Height  Chief Complaint  Patient presents with   Back Pain    Patient is having back pain, it makes it difficult for her to get in and out of bed.    Insomnia    Patient c/o having trouble sleeping    Subjective   Amy Gallagher is a 87 y.o. established patient. Telephone visit today for back pain, insomnia, and paperwork  HPI Back pain - ongoing. Stiff. Limits mobility. No radicular symptoms. No saddle symptoms  Insomnia - more and more often. Trouble falling asleep, trouble staying asleep. Naps 2x daily, but this has been fairly steady. Gets 2-3 hours of sleep each night. Her activity is fairly limited. Besides above, reports good sleep hygiene.   Paperwork - has papers from insurance for more comprehensive home health. Needs this due to pain, doe, pain that interferes with ambulation, balance concerns, history of falls.  Otherwise doing well.   Patient Active Problem List   Diagnosis Date Noted   Chronic pain of both knees 05/15/2020   Limited mobility  05/15/2020   At high risk for injury related to fall 05/15/2020   Urinary urgency 06/10/2016   Environmental allergies 08/14/2011   Gout 09/10/2006   DEPRESSION, MAJOR, RECURRENT 09/10/2006   Former smoker 09/10/2006   HYPERTENSION, BENIGN SYSTEMIC 09/10/2006   Mild intermittent asthma 09/10/2006   REFLUX ESOPHAGITIS 09/10/2006   Arthritis 09/10/2006    Past Medical History:  Diagnosis Date   Arthritis    Bronchitis    Gout     Current Outpatient Medications  Medication Sig Dispense Refill   albuterol (PROVENTIL) (2.5 MG/3ML) 0.083% nebulizer solution Take 3 mLs (2.5 mg total) by nebulization 2 (two) times daily as needed for wheezing or shortness of breath. 150 mL 1   albuterol (VENTOLIN HFA) 108 (90 Base) MCG/ACT inhaler INHALE 2 PUFFS INTO THE LUNGS EVERY 4 HOURS AS NEEDED FOR WHEEZE OR FOR SHORTNESS OF BREATH 8.5 each 1   allopurinol (ZYLOPRIM) 100 MG tablet Take 1 tablet (100 mg total) by mouth daily. 90 tablet 1   benzonatate (TESSALON) 100 MG capsule Take 1-2 capsules (100-200 mg total) by mouth 3 (three) times daily as needed for cough. 40 capsule 1   citalopram (CELEXA) 10 MG tablet Take 1 tablet (10 mg total) by mouth daily. 90 tablet 1   Colchicine (MITIGARE) 0.6 MG CAPS Take 0.6 mg by mouth daily. Dispense as MITIGARE 90 capsule 1   CVS VITAMIN C 500 MG tablet TAKE 1 TABLET BY MOUTH EVERY DAY 90 tablet 3   Dextromethorphan HBr 10 MG/15ML SYRP Take 15 mLs (10 mg total) by mouth daily.  120 mL 3   fluticasone (FLONASE) 50 MCG/ACT nasal spray SPRAY 2 SPRAYS INTO EACH NOSTRIL EVERY DAY 48 mL 1   fluticasone (FLOVENT HFA) 44 MCG/ACT inhaler Inhale 2 puffs into the lungs 2 (two) times daily. 1 each 5   gabapentin (NEURONTIN) 100 MG capsule Take 1 capsule (100 mg total) by mouth 3 (three) times daily. 90 capsule 3   hydrochlorothiazide (HYDRODIURIL) 25 MG tablet Take 1 tablet (25 mg total) by mouth daily. 90 tablet 1   methocarbamol (ROBAXIN) 500 MG tablet Take 0.5 tablets (250  mg total) by mouth 2 (two) times daily as needed for muscle spasms. 60 tablet 0   Misc. Devices (COMMODE) MISC Please dispense one rail for commode. ICD10 Z74.09 1 each 0   Misc. Devices [facility]) MISC 1 Device by Does not apply route daily. 1 each 0   Multiple Vitamin (MULTIVITAMIN WITH MINERALS) TABS tablet Take 1 tablet by mouth daily.     pantoprazole (PROTONIX) 40 MG tablet TAKE 1 TABLET BY MOUTH EVERY DAY 90 tablet 1   trolamine salicylate (ASPERCREME/ALOE) 10 % cream Apply 1 application topically as needed for muscle pain. 85 g 0   acetaminophen-codeine (TYLENOL #2) 300-15 MG tablet Take 1 tablet by mouth 2 (two) times daily as needed for moderate pain or severe pain. 60 tablet 1   ALPRAZolam (XANAX) 0.25 MG tablet Take 0.5 tablets (0.125 mg total) by mouth at bedtime as needed for sleep. Take once in bed for the night 30 tablet 1   lidocaine (XYLOCAINE) 5 % ointment Apply 1 application topically as needed. 35.44 g 0   No current facility-administered medications for this visit.    Allergies  Allergen Reactions   Aspirin Nausea And Vomiting   Penicillins Swelling    Social History   Socioeconomic History   Marital status: Widowed    Spouse name: Not on file   Number of children: Not on file   Years of education: Not on file   Highest education level: Not on file  Occupational History   Not on file  Tobacco Use   Smoking status: Former    Types: Cigarettes    Quit date: 02/26/1997    Years since quitting: 24.0   Smokeless tobacco: Never  Substance and Sexual Activity   Alcohol use: No   Drug use: No   Sexual activity: Not on file  Other Topics Concern   Not on file  Social History Narrative   Patient cares for her mentally 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    Walks with a rolling walker.    Social Determinants of Health   Financial Resource Strain: Not on file  Food Insecurity: Not on file  Transportation Needs: Not on  file  Physical Activity: Not on file  Stress: Not on file  Social Connections: Not on file  Intimate Partner Violence: Not on file    ROS Per hpi   Objective   Vitals as reported by the patient: There were no vitals filed for this visit.  Gracie was seen today for back pain and insomnia.  Diagnoses and all orders for this visit:  Limited mobility  Osteoarthritis of multiple joints, unspecified osteoarthritis type -     lidocaine (XYLOCAINE) 5 % ointment; Apply 1 application topically as needed. -     methocarbamol (ROBAXIN) 500 MG tablet; Take 0.5 tablets (250 mg total) by mouth 2 (two) times daily as needed for muscle spasms. -  acetaminophen-codeine (TYLENOL #2) 300-15 MG tablet; Take 1 tablet by mouth 2 (two) times daily as needed for moderate pain or severe pain.  Insomnia, unspecified type -     ALPRAZolam (XANAX) 0.25 MG tablet; Take 0.5 tablets (0.125 mg total) by mouth at bedtime as needed for sleep. Take once in bed for the night  At high risk for injury related to fall  Chronic pain of both knees  Requires assistance with all daily activities  Back pain, unspecified back location, unspecified back pain laterality, unspecified chronicity   PLAN Refill tylenol w codeine and alprazolam Start methocarbamol 250mg  po bid prn.  Reviewed risks, benefits, and alternatives to these medications with patient, who voices understanding. There is significant risk in these given her age and fall risk, but at this time benefit likely outweighs risk given her orthopedic condition is the greatest impact on her ADLs. Paperwork to be filled out and returned Patient encouraged to call clinic with any questions, comments, or concerns.  I discussed the assessment and treatment plan with the patient. The patient was provided an opportunity to ask questions and all were answered. The patient agreed with the plan and demonstrated an understanding of the instructions.   The patient  was advised to call back or seek an in-person evaluation if the symptoms worsen or if the condition fails to improve as anticipated.  I provided 17 minutes of non-face-to-face time during this encounter.  , NP

## 2021-03-28 DIAGNOSIS — R3915 Urgency of urination: Secondary | ICD-10-CM | POA: Diagnosis not present

## 2021-03-28 DIAGNOSIS — Z741 Need for assistance with personal care: Secondary | ICD-10-CM | POA: Diagnosis not present

## 2021-03-30 DIAGNOSIS — Z0279 Encounter for issue of other medical certificate: Secondary | ICD-10-CM

## 2021-04-02 ENCOUNTER — Telehealth: Payer: Self-pay

## 2021-04-02 NOTE — Telephone Encounter (Signed)
Pt is calling back she said that the paperwork send for [facility] was missing some information and she needed for you to fix that and she needs to speak with you regarding this before her aide could come out?   Pt call back [phone]

## 2021-04-03 NOTE — Telephone Encounter (Signed)
Spoke with the patient and she stated that she was going to call and get the form faxed here to be signed

## 2021-04-05 DIAGNOSIS — Z7409 Other reduced mobility: Secondary | ICD-10-CM | POA: Diagnosis not present

## 2021-04-05 DIAGNOSIS — Z9181 History of falling: Secondary | ICD-10-CM | POA: Diagnosis not present

## 2021-04-05 DIAGNOSIS — M25569 Pain in unspecified knee: Secondary | ICD-10-CM | POA: Diagnosis not present

## 2021-04-05 DIAGNOSIS — M1389 Other specified arthritis, multiple sites: Secondary | ICD-10-CM | POA: Diagnosis not present

## 2021-04-07 ENCOUNTER — Other Ambulatory Visit: Payer: Self-pay | Admitting: Registered Nurse

## 2021-04-07 DIAGNOSIS — J45909 Unspecified asthma, uncomplicated: Secondary | ICD-10-CM

## 2021-04-07 DIAGNOSIS — R062 Wheezing: Secondary | ICD-10-CM

## 2021-04-08 NOTE — Telephone Encounter (Signed)
Have been completed and returned  Thanks,  Luan Pulling

## 2021-04-09 NOTE — Telephone Encounter (Signed)
[facility] pt called for follow up on Home Heath Forms have they been completed?

## 2021-04-11 NOTE — Telephone Encounter (Signed)
Do you have this paperwork by any chance. The last time I spoke with patient she was going to have a copy sent for home health.

## 2021-04-17 ENCOUNTER — Telehealth: Payer: Self-pay

## 2021-04-17 NOTE — Telephone Encounter (Signed)
[name], I looked in the patient chart and did not see anything in reference to a referral. Lat pt visit was 03/19/21 v/v. Please advise

## 2021-04-17 NOTE — Telephone Encounter (Signed)
Caller name:Amy Gallagher   On DPR? :No  Call back number:[phone]  Provider they see: [name]  Reason for call:Pt is calling about referral that was suppose to be sent to [facility] and when pt called [facility] they have never received anything from [name]? This form was for personal care in the home. Pt has talked to [name] about this and [facility] told her that she would send something over but nothing yet?

## 2021-04-29 ENCOUNTER — Telehealth: Payer: Self-pay | Admitting: Registered Nurse

## 2021-04-29 ENCOUNTER — Other Ambulatory Visit: Payer: Self-pay

## 2021-04-29 DIAGNOSIS — R3915 Urgency of urination: Secondary | ICD-10-CM | POA: Diagnosis not present

## 2021-04-29 DIAGNOSIS — I10 Essential (primary) hypertension: Secondary | ICD-10-CM

## 2021-04-29 DIAGNOSIS — Z741 Need for assistance with personal care: Secondary | ICD-10-CM | POA: Diagnosis not present

## 2021-04-29 DIAGNOSIS — M109 Gout, unspecified: Secondary | ICD-10-CM

## 2021-04-29 MED ORDER — HYDROCHLOROTHIAZIDE 25 MG PO TABS: 25.0000 mg | ORAL_TABLET | Freq: Every day | ORAL | 1 refills | Status: DC

## 2021-04-29 MED ORDER — ALLOPURINOL 100 MG PO TABS: 100.0000 mg | ORAL_TABLET | Freq: Every day | ORAL | 1 refills | Status: DC

## 2021-04-29 NOTE — Telephone Encounter (Signed)
..  Caller name: Market researcher callback [phone]  Encourage patient to contact the pharmacy for refills or they can request refills through [facility]  LAST APPOINTMENT DATE:  (Please schedule appointment if longer than 1 year)  NEXT APPOINTMENT DATE: na  MEDICATION NAME & DOSE:   Hydrochlorothiazide and allopurinol  Is the patient out of medication? YES/NO: Yes   [facility] [address] [place] Let patient know to contact pharmacy at the end of the day to make sure medication is ready.  Please notify patient to allow 48-72 hours to process  (CLINICAL TO FILL OR ROUTE PER PROTOCOLS)

## 2021-04-30 NOTE — Telephone Encounter (Signed)
Acknowledged  Thank you  Rich

## 2021-04-30 NOTE — Telephone Encounter (Signed)
Have addressed,  Thank you  Luan Pulling

## 2021-05-05 DIAGNOSIS — Z7409 Other reduced mobility: Secondary | ICD-10-CM | POA: Diagnosis not present

## 2021-05-05 DIAGNOSIS — M1389 Other specified arthritis, multiple sites: Secondary | ICD-10-CM | POA: Diagnosis not present

## 2021-05-05 DIAGNOSIS — Z9181 History of falling: Secondary | ICD-10-CM | POA: Diagnosis not present

## 2021-05-05 DIAGNOSIS — M25569 Pain in unspecified knee: Secondary | ICD-10-CM | POA: Diagnosis not present

## 2021-05-14 ENCOUNTER — Telehealth (INDEPENDENT_AMBULATORY_CARE_PROVIDER_SITE_OTHER): Payer: Medicare HMO | Admitting: Registered Nurse

## 2021-05-14 ENCOUNTER — Other Ambulatory Visit: Payer: Self-pay

## 2021-05-14 ENCOUNTER — Encounter: Payer: Self-pay | Admitting: Registered Nurse

## 2021-05-14 DIAGNOSIS — Z7409 Other reduced mobility: Secondary | ICD-10-CM | POA: Diagnosis not present

## 2021-05-14 DIAGNOSIS — K5904 Chronic idiopathic constipation: Secondary | ICD-10-CM | POA: Diagnosis not present

## 2021-05-14 DIAGNOSIS — G8929 Other chronic pain: Secondary | ICD-10-CM | POA: Diagnosis not present

## 2021-05-14 DIAGNOSIS — M25561 Pain in right knee: Secondary | ICD-10-CM

## 2021-05-14 DIAGNOSIS — Z9181 History of falling: Secondary | ICD-10-CM | POA: Diagnosis not present

## 2021-05-14 DIAGNOSIS — M25562 Pain in left knee: Secondary | ICD-10-CM

## 2021-05-14 DIAGNOSIS — Z741 Need for assistance with personal care: Secondary | ICD-10-CM

## 2021-05-14 NOTE — Patient Instructions (Signed)
° ° ° °  If you have lab work done today you will be contacted with your lab results within the next 2 weeks.  If you have not heard from us then please contact us. The fastest way to get your results is to register for My Chart. ° ° °IF you received an x-ray today, you will receive an invoice from [facility]. Please contact [facility] at [phone] with questions or concerns regarding your invoice.  ° °IF you received labwork today, you will receive an invoice from [facility]. Please contact [facility] at [phone] with questions or concerns regarding your invoice.  ° °Our billing staff will not be able to assist you with questions regarding bills from these companies. ° °You will be contacted with the lab results as soon as they are available. The fastest way to get your results is to activate your My Chart account. Instructions are located on the last page of this paperwork. If you have not heard from us regarding the results in 2 weeks, please contact this office. °  ° ° ° °

## 2021-05-14 NOTE — Progress Notes (Signed)
Telemedicine Encounter- SOAP NOTE Established Patient  This telephone encounter was conducted with the patient's (or proxy's) verbal consent via audio telecommunications: yes/no: Yes Patient was instructed to have this encounter in a suitably private space; and to only have persons present to whom they give permission to participate. In addition, patient identity was confirmed by use of name plus two identifiers (DOB and address).  I discussed the limitations, risks, security and privacy concerns of performing an evaluation and management service by telephone and the availability of in person appointments. I also discussed with the patient that there may be a patient responsible charge related to this service. The patient expressed understanding and agreed to proceed.  I spent a total of 18 minutes talking with the patient or their proxy.  Patient at home Provider in office  Participants: [name], NP and Gus Height  Chief Complaint  Patient presents with   Follow-up    Patient states she wants to discuss her getting a wheelchair she states that its too small. She also would like some bars for the shower at home. Patient states she needs to discuss constipation and a letter    Subjective   Amy Gallagher is a 87 y.o. established patient. Telephone visit today for follow up   HPI Concern for wheelchair too small - interested in getting this corrected, working with [facility] to get this corrected. Requesting that we send a new DME order.  She is also looking to get a new bedside commode, as she uses this in the nights due to her poor mobility.  She still does shower mostly independently. She is working on getting set up with home health- looks like referral approved to [place] in June, but pt has not heard from them. Will check on status.  Interested in program through  [phone] Acct # [id number]  Demetri Kerman is pt contact.   Patient Active Problem List    Diagnosis Date Noted   Chronic pain of both knees 05/15/2020   Limited mobility 05/15/2020   At high risk for injury related to fall 05/15/2020   Urinary urgency 06/10/2016   Environmental allergies 08/14/2011   Gout 09/10/2006   DEPRESSION, MAJOR, RECURRENT 09/10/2006   Former smoker 09/10/2006   HYPERTENSION, BENIGN SYSTEMIC 09/10/2006   Mild intermittent asthma 09/10/2006   REFLUX ESOPHAGITIS 09/10/2006   Arthritis 09/10/2006    Past Medical History:  Diagnosis Date   Arthritis    Bronchitis    Gout     Current Outpatient Medications  Medication Sig Dispense Refill   acetaminophen-codeine (TYLENOL #2) 300-15 MG tablet Take 1 tablet by mouth 2 (two) times daily as needed for moderate pain or severe pain. 60 tablet 1   albuterol (PROVENTIL) (2.5 MG/3ML) 0.083% nebulizer solution Take 3 mLs (2.5 mg total) by nebulization 2 (two) times daily as needed for wheezing or shortness of breath. 150 mL 1   ALPRAZolam (XANAX) 0.25 MG tablet Take 0.5 tablets (0.125 mg total) by mouth at bedtime as needed for sleep. Take once in bed for the night 30 tablet 1   benzonatate (TESSALON) 100 MG capsule Take 1-2 capsules (100-200 mg total) by mouth 3 (three) times daily as needed for cough. 40 capsule 1   citalopram (CELEXA) 10 MG tablet Take 1 tablet (10 mg total) by mouth daily. 90 tablet 1   Colchicine (MITIGARE) 0.6 MG CAPS Take 0.6 mg by mouth daily. Dispense as MITIGARE 90 capsule 1   [employer] VITAMIN  C 500 MG tablet TAKE 1 TABLET BY MOUTH EVERY DAY 90 tablet 3   Dextromethorphan HBr 10 MG/15ML SYRP Take 15 mLs (10 mg total) by mouth daily. 120 mL 3   gabapentin (NEURONTIN) 100 MG capsule Take 1 capsule (100 mg total) by mouth 3 (three) times daily. 90 capsule 3   hydrochlorothiazide (HYDRODIURIL) 25 MG tablet Take 1 tablet (25 mg total) by mouth daily. 90 tablet 1   lidocaine (XYLOCAINE) 5 % ointment Apply 1 application topically as needed. 35.44 g 0   methocarbamol (ROBAXIN) 500 MG tablet Take  0.5 tablets (250 mg total) by mouth 2 (two) times daily as needed for muscle spasms. 60 tablet 0   Misc. Devices (COMMODE) MISC Please dispense one rail for commode. ICD10 Z74.09 1 each 0   Misc. Devices [facility]) MISC 1 Device by Does not apply route daily. 1 each 0   Multiple Vitamin (MULTIVITAMIN WITH MINERALS) TABS tablet Take 1 tablet by mouth daily.     pantoprazole (PROTONIX) 40 MG tablet TAKE 1 TABLET BY MOUTH EVERY DAY 90 tablet 1   sennosides-docusate sodium (SENOKOT-S) 8.6-50 MG tablet Take 1 tablet by mouth daily. 90 tablet 3   trolamine salicylate (ASPERCREME/ALOE) 10 % cream Apply 1 application topically as needed for muscle pain. 85 g 0   albuterol (VENTOLIN HFA) 108 (90 Base) MCG/ACT inhaler INHALE 2 PUFFS INTO THE LUNGS EVERY 4 HOURS AS NEEDED FOR WHEEZE OR FOR SHORTNESS OF BREATH 8.5 each 1   allopurinol (ZYLOPRIM) 100 MG tablet TAKE 1 TABLET BY MOUTH EVERY DAY 90 tablet 1   FLOVENT HFA 44 MCG/ACT inhaler INHALE 2 PUFFS INTO THE LUNGS TWICE A DAY 10.6 each 5   fluticasone (FLONASE) 50 MCG/ACT nasal spray SPRAY 2 SPRAYS INTO EACH NOSTRIL EVERY DAY 48 mL 1   sulfamethoxazole-trimethoprim (BACTRIM DS) 800-160 MG tablet Take 1 tablet by mouth 2 (two) times daily. 6 tablet 0   No current facility-administered medications for this visit.    Allergies  Allergen Reactions   Aspirin Nausea And Vomiting   Penicillins Swelling    Social History   Socioeconomic History   Marital status: Widowed    Spouse name: Not on file   Number of children: Not on file   Years of education: Not on file   Highest education level: Not on file  Occupational History   Not on file  Tobacco Use   Smoking status: Former    Types: Cigarettes    Quit date: 02/26/1997    Years since quitting: 24.4   Smokeless tobacco: Never  Substance and Sexual Activity   Alcohol use: No   Drug use: No   Sexual activity: Not on file  Other Topics Concern   Not on file  Social History Narrative   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    Walks with a rolling walker.    Social Determinants of Health   Financial Resource Strain: Not on file  Food Insecurity: Not on file  Transportation Needs: Not on file  Physical Activity: Not on file  Stress: Not on file  Social Connections: Not on file  Intimate Partner Violence: Not on file    ROS  Objective   Vitals as reported by the patient: There were no vitals filed for this visit.  Ilaria was seen today for follow-up.  Diagnoses and all orders for this visit:  Chronic idiopathic constipation -  sennosides-docusate sodium (SENOKOT-S) 8.6-50 MG tablet; Take 1 tablet by mouth daily.  Limited mobility -     DME Wheelchair electric -     DME Bedside commode -     sennosides-docusate sodium (SENOKOT-S) 8.6-50 MG tablet; Take 1 tablet by mouth daily.  At high risk for injury related to fall -     DME Wheelchair electric -     DME Bedside commode -     sennosides-docusate sodium (SENOKOT-S) 8.6-50 MG tablet; Take 1 tablet by mouth daily.  Chronic pain of both knees -     DME Wheelchair electric -     DME Bedside commode -     sennosides-docusate sodium (SENOKOT-S) 8.6-50 MG tablet; Take 1 tablet by mouth daily.  Requires assistance with all daily activities -     DME Wheelchair electric -     DME Bedside commode -     sennosides-docusate sodium (SENOKOT-S) 8.6-50 MG tablet; Take 1 tablet by mouth daily.    PLAN DME orders as above Refill stool stoftener for prn use. Return if symptosm progress Will look for fax from program listed above Patient encouraged to call clinic with any questions, comments, or concerns.  I discussed the assessment and treatment plan with the patient. The patient was provided an opportunity to ask questions and all were answered. The patient agreed with the plan and demonstrated an understanding of the instructions.   The patient was  advised to call back or seek an in-person evaluation if the symptoms worsen or if the condition fails to improve as anticipated.  I provided 18 minutes of non-face-to-face time during this encounter.  [name], NP

## 2021-06-03 ENCOUNTER — Telehealth: Payer: Self-pay

## 2021-06-03 NOTE — Telephone Encounter (Signed)
I called [employer] and they stated they were unable to process the forms from Sept. Because the date of last office visit wasn't on there. I have printed out those forms and [name]'s signature must be updated with the new date of signature and last office visit date needs to be added. Thanks!

## 2021-06-03 NOTE — Telephone Encounter (Signed)
Caller name:Lousie Clearance Coots   On DPR? :Yes  Call back number:[phone]  Provider they see: [name]  Reason for call:[employer] was waiting on a form for [name] to return for [facility]. [employer] said they need a verbal or a request for this service. [employer] told the patient that there is not form that needs to be sent from [place].

## 2021-06-12 NOTE — Telephone Encounter (Signed)
Spoke to Braman from [facility], she wanted an update on the forms for home health for this pt. She states that she needs the last office visit it has to be within the last 90 days and the signature has to be dated after that date.   Please fax to [phone]

## 2021-06-13 NOTE — Telephone Encounter (Signed)
Patient paperwork has been faxed to [facility] at [employer].

## 2021-06-21 ENCOUNTER — Telehealth: Payer: Self-pay

## 2021-06-21 NOTE — Telephone Encounter (Signed)
Caller name:Lousie Clearance Coots   On DPR? :No  Call back [phone]  Provider they see: [name]  Reason for call:Ms Mansouri called about the Home Health informed her that when [place] calls to as for [facility]

## 2021-06-27 ENCOUNTER — Telehealth (INDEPENDENT_AMBULATORY_CARE_PROVIDER_SITE_OTHER): Payer: Medicare HMO | Admitting: Family Medicine

## 2021-06-27 ENCOUNTER — Other Ambulatory Visit: Payer: Self-pay | Admitting: Registered Nurse

## 2021-06-27 ENCOUNTER — Telehealth: Payer: Medicare HMO | Admitting: Registered Nurse

## 2021-06-27 DIAGNOSIS — M25561 Pain in right knee: Secondary | ICD-10-CM | POA: Diagnosis not present

## 2021-06-27 DIAGNOSIS — Z741 Need for assistance with personal care: Secondary | ICD-10-CM | POA: Diagnosis not present

## 2021-06-27 DIAGNOSIS — R062 Wheezing: Secondary | ICD-10-CM

## 2021-06-27 DIAGNOSIS — N390 Urinary tract infection, site not specified: Secondary | ICD-10-CM

## 2021-06-27 DIAGNOSIS — G8929 Other chronic pain: Secondary | ICD-10-CM

## 2021-06-27 DIAGNOSIS — M25562 Pain in left knee: Secondary | ICD-10-CM | POA: Diagnosis not present

## 2021-06-27 DIAGNOSIS — J45909 Unspecified asthma, uncomplicated: Secondary | ICD-10-CM

## 2021-06-27 DIAGNOSIS — R3915 Urgency of urination: Secondary | ICD-10-CM | POA: Diagnosis not present

## 2021-06-27 MED ORDER — SULFAMETHOXAZOLE-TRIMETHOPRIM 800-160 MG PO TABS: 1.0000 | ORAL_TABLET | Freq: Two times a day (BID) | ORAL | 0 refills | Status: DC

## 2021-06-27 NOTE — Telephone Encounter (Signed)
Spoke with patient to let her know that the paperwork was sent twice to [facility]

## 2021-06-27 NOTE — Progress Notes (Signed)
Virtual Visit via Telephone Note  I connected with Amy Gallagher on 06/27/21 at 8:21 AM by telephone and verified that I am speaking with the correct person using two identifiers. Patient location: home, by self.  My location: office -[place].    I discussed the limitations, risks, security and privacy concerns of performing an evaluation and management service by telephone and the availability of in person appointments. I also discussed with the patient that there may be a patient responsible charge related to this service. The patient expressed understanding and agreed to proceed, consent obtained  Chief complaint: Chief Complaint  Patient presents with   odor to urine    Pt reports strong odor to urine, no pain no discharge   Knee Pain    Pt would like to ask about getting the same injections as her daughter for her knees     History of Present Illness:  Abnormal urine odor: Noticed 4 days ago. No dysuria. Some increased frequency, urgency. Prior UTI felt same.  No fever, abd pain, back pain, n/v.  No vaginal discharge. No recent abx.   No treatments.    Knee pain: Primary care provider Kathrin Ruddy, NP.  She has been treated for difficulty with mobility, chronic pain of both knees.  Appointment last month.  Wheelchair has been ordered, PCP working on adjusting chair size and other assistance for home activities.  It appears she was referred to orthopedics last year but unable to go due to mobility issues.  Same pain for years. Has received injections - about 5 years ago. Has seen specialist, Dr. Ninfa Linden - 3-4 years ago. No recent ortho eval. Has been taking tylenol 8 otc BID. Helps some. Did not like codeine. Daughter was prescribed meloxicam - would like to try this medicine. ASA allergy.  No hx of PUD.   Lab Results  Component Value Date   CREATININE 0.86 08/09/2019     Patient Active Problem List   Diagnosis Date Noted   Chronic pain of both knees 05/15/2020    Limited mobility 05/15/2020   At high risk for injury related to fall 05/15/2020   Urinary urgency 06/10/2016   Environmental allergies 08/14/2011   Gout 09/10/2006   DEPRESSION, MAJOR, RECURRENT 09/10/2006   Former smoker 09/10/2006   HYPERTENSION, BENIGN SYSTEMIC 09/10/2006   Mild intermittent asthma 09/10/2006   REFLUX ESOPHAGITIS 09/10/2006   Arthritis 09/10/2006   Past Medical History:  Diagnosis Date   Arthritis    Bronchitis    Gout    Past Surgical History:  Procedure Laterality Date   SALPINGECTOMY     right   Allergies  Allergen Reactions   Aspirin Nausea And Vomiting   Penicillins Swelling   Prior to Admission medications   Medication Sig Start Date End Date Taking? Authorizing Provider  acetaminophen-codeine (TYLENOL #2) 300-15 MG tablet Take 1 tablet by mouth 2 (two) times daily as needed for moderate pain or severe pain. 03/19/21  Yes Maximiano Coss, NP  albuterol (PROVENTIL) (2.5 MG/3ML) 0.083% nebulizer solution Take 3 mLs (2.5 mg total) by nebulization 2 (two) times daily as needed for wheezing or shortness of breath. 08/10/19  Yes Maximiano Coss, NP  albuterol (VENTOLIN HFA) 108 (90 Base) MCG/ACT inhaler INHALE 2 PUFFS INTO THE LUNGS EVERY 4 HOURS AS NEEDED FOR WHEEZE OR FOR SHORTNESS OF BREATH 04/08/21  Yes Maximiano Coss, NP  allopurinol (ZYLOPRIM) 100 MG tablet Take 1 tablet (100 mg total) by mouth daily. 04/29/21  Yes Maximiano Coss, NP  ALPRAZolam (XANAX) 0.25 MG tablet Take 0.5 tablets (0.125 mg total) by mouth at bedtime as needed for sleep. Take once in bed for the night 03/19/21  Yes [name], NP  benzonatate (TESSALON) 100 MG capsule Take 1-2 capsules (100-200 mg total) by mouth 3 (three) times daily as needed for cough. 08/10/19  Yes [name], NP  citalopram (CELEXA) 10 MG tablet Take 1 tablet (10 mg total) by mouth daily. 08/10/19  Yes [name], NP  Colchicine (MITIGARE) 0.6 MG CAPS Take 0.6 mg by mouth daily. Dispense as MITIGARE  10/05/19  Yes Doristine Bosworth, MD  CVS VITAMIN C 500 MG tablet TAKE 1 TABLET BY MOUTH EVERY DAY 03/09/20  Yes [name], NP  Dextromethorphan HBr 10 MG/15ML SYRP Take 15 mLs (10 mg total) by mouth daily. 09/23/19  Yes Stallings, Zoe A, MD  fluticasone (FLONASE) 50 MCG/ACT nasal spray SPRAY 2 SPRAYS INTO EACH NOSTRIL EVERY DAY 12/20/20  Yes [name], NP  fluticasone (FLOVENT HFA) 44 MCG/ACT inhaler Inhale 2 puffs into the lungs 2 (two) times daily. 12/20/20  Yes [name], NP  gabapentin (NEURONTIN) 100 MG capsule Take 1 capsule (100 mg total) by mouth 3 (three) times daily. 12/28/19  Yes [name], NP  hydrochlorothiazide (HYDRODIURIL) 25 MG tablet Take 1 tablet (25 mg total) by mouth daily. 04/29/21  Yes [name], NP  lidocaine (XYLOCAINE) 5 % ointment Apply 1 application topically as needed. 03/19/21  Yes [name], NP  methocarbamol (ROBAXIN) 500 MG tablet Take 0.5 tablets (250 mg total) by mouth 2 (two) times daily as needed for muscle spasms. 03/19/21  Yes [name], NP  Misc. Devices (COMMODE) MISC Please dispense one rail for commode. ICD10 Z74.09 05/02/19  Yes Doristine Bosworth, MD  Misc. Devices [facility]) MISC 1 Device by Does not apply route daily. 04/27/17  Yes Weber, Dema Severin, PA-C  Multiple Vitamin (MULTIVITAMIN WITH MINERALS) TABS tablet Take 1 tablet by mouth daily.   Yes [provider]  pantoprazole (PROTONIX) 40 MG tablet TAKE 1 TABLET BY MOUTH EVERY DAY 10/07/19  Yes Stallings, Zoe A, MD  trolamine salicylate (ASPERCREME/ALOE) 10 % cream Apply 1 application topically as needed for muscle pain. 09/24/16  Yes McVey, Madelaine Bhat, PA-C   Social History   Socioeconomic History   Marital status: Widowed    Spouse name: Not on file   Number of children: Not on file   Years of education: Not on file   Highest education level: Not on file  Occupational History   Not on file  Tobacco Use   Smoking status: Former    Types: Cigarettes     Quit date: 02/26/1997    Years since quitting: 24.3   Smokeless tobacco: Never  Substance and Sexual Activity   Alcohol use: No   Drug use: No   Sexual activity: Not on file  Other Topics Concern   Not on file  Social History Narrative   Patient cares for her mentally 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    Walks with a rolling walker.    Social Determinants of Health   Financial Resource Strain: Not on file  Food Insecurity: Not on file  Transportation Needs: Not on file  Physical Activity: Not on file  Stress: Not on file  Social Connections: Not on file  Intimate Partner Violence: Not on file     Observations/Objective: There were no vitals filed for this visit. Speaking in full  sentences, no distress, no respiratory distress.  Coherent responses.  Euthymic mood.  All questions were answered with understanding of plan expressed by patient.   Assessment and Plan: Chronic pain of both knees - Plan: Ambulatory referral to Orthopedic Surgery  -Longstanding issue.  Has not had recent orthopedic eval, previously was advised that surgery was needed.  Did agree to repeat Ortho referral to discuss possible options as she may be candidate for repeat injection.  If not, then can discuss other pain management options with PCP.  For now I did recommend increasing the Tylenol to every 8 hours for improved control.  Decided against new medication or NSAID/Mobic at this time.  RTC precautions.  Urinary tract infection without hematuria, site unspecified - Plan: sulfamethoxazole-trimethoprim (BACTRIM DS) 800-160 MG tablet  -Abnormal odor but also with urgency/frequency.  No other concerning symptoms.  Ideally would like to see UA/culture but limitations with phone visit.  Based on mild symptoms at this time we will treat with Septra for 3 days.  Last urine culture noted but that was 1 year ago.  Penicillin allergic.  RTC precautions discussed for urinalysis  and culture if not clinically improving through the weekend.  Understanding expressed.  RTC/ER precautions if worsening.  Follow Up Instructions: If not improving next few days.  In office visit recommended.   I discussed the assessment and treatment plan with the patient. The patient was provided an opportunity to ask questions and all were answered. The patient agreed with the plan and demonstrated an understanding of the instructions.   The patient was advised to call back or seek an in-person evaluation if the symptoms worsen or if the condition fails to improve as anticipated.  I provided 16 minutes of non-face-to-face time during this encounter.  Signed,   Merri Ray, MD [facility].  06/27/21

## 2021-07-20 ENCOUNTER — Other Ambulatory Visit: Payer: Self-pay | Admitting: Registered Nurse

## 2021-07-20 DIAGNOSIS — J45909 Unspecified asthma, uncomplicated: Secondary | ICD-10-CM

## 2021-07-22 ENCOUNTER — Ambulatory Visit: Payer: Medicare HMO | Admitting: Orthopaedic Surgery

## 2021-07-28 ENCOUNTER — Other Ambulatory Visit: Payer: Self-pay | Admitting: Registered Nurse

## 2021-07-28 DIAGNOSIS — M159 Polyosteoarthritis, unspecified: Secondary | ICD-10-CM

## 2021-07-28 DIAGNOSIS — M109 Gout, unspecified: Secondary | ICD-10-CM

## 2021-07-31 ENCOUNTER — Telehealth: Payer: Self-pay | Admitting: Registered Nurse

## 2021-07-31 DIAGNOSIS — Z741 Need for assistance with personal care: Secondary | ICD-10-CM | POA: Diagnosis not present

## 2021-07-31 DIAGNOSIS — R3915 Urgency of urination: Secondary | ICD-10-CM | POA: Diagnosis not present

## 2021-07-31 MED ORDER — SENNA-DOCUSATE SODIUM 8.6-50 MG PO TABS: 1.0000 | ORAL_TABLET | Freq: Every day | ORAL | 3 refills | Status: DC

## 2021-07-31 NOTE — Telephone Encounter (Signed)
I did not see this form when sorting papers for today

## 2021-07-31 NOTE — Telephone Encounter (Signed)
I have placed forms for pt in the bin up front with a charge sheet. Forms are from [facility] care. States the forms were sent in before incomplete.

## 2021-08-01 NOTE — Telephone Encounter (Signed)
Update on the retrieval of these?

## 2021-08-01 NOTE — Telephone Encounter (Signed)
Found these and placed in [place] to be signed

## 2021-08-06 NOTE — Telephone Encounter (Signed)
Did you finish these?

## 2021-08-08 NOTE — Telephone Encounter (Signed)
Pt looking for these

## 2021-08-08 NOTE — Telephone Encounter (Signed)
Received patient paperwork and it will be faxed today.

## 2021-08-08 NOTE — Telephone Encounter (Signed)
Yes, brooke has them  Thanks,  [name]

## 2021-08-12 NOTE — Telephone Encounter (Signed)
Patients paperwork was faxed and received confirmation on 08/08/2021.

## 2021-08-12 NOTE — Telephone Encounter (Signed)
Updated status

## 2021-08-21 ENCOUNTER — Other Ambulatory Visit: Payer: Self-pay

## 2021-08-21 ENCOUNTER — Telehealth (INDEPENDENT_AMBULATORY_CARE_PROVIDER_SITE_OTHER): Payer: Medicare HMO | Admitting: Registered Nurse

## 2021-08-21 DIAGNOSIS — G8929 Other chronic pain: Secondary | ICD-10-CM

## 2021-08-21 DIAGNOSIS — M25562 Pain in left knee: Secondary | ICD-10-CM

## 2021-08-21 DIAGNOSIS — Z5986 Financial insecurity: Secondary | ICD-10-CM

## 2021-08-21 DIAGNOSIS — Z7409 Other reduced mobility: Secondary | ICD-10-CM | POA: Diagnosis not present

## 2021-08-21 DIAGNOSIS — M25561 Pain in right knee: Secondary | ICD-10-CM

## 2021-08-21 NOTE — Progress Notes (Signed)
Telemedicine Encounter- SOAP NOTE Established Patient  This telephone encounter was conducted with the patient's (or proxy's) verbal consent via audio telecommunications: yes/no: Yes Patient was instructed to have this encounter in a suitably private space; and to only have persons present to whom they give permission to participate. In addition, patient identity was confirmed by use of name plus two identifiers (DOB and address).  I discussed the limitations, risks, security and privacy concerns of performing an evaluation and management service by telephone and the availability of in person appointments. I also discussed with the patient that there may be a patient responsible charge related to this service. The patient expressed understanding and agreed to proceed.  I spent a total of 23 minutes talking with the patient or their proxy.  Patient at home Provider in office  Participants: [name], NP and Gus Height  Chief Complaint  Patient presents with   Follow-up    Patient states she would like to discuss her health concerns and also a letter or phone call to help with the program that she is currently in.    Subjective   Amy Gallagher is a 88 y.o. established patient. Telephone visit today for health concerns  HPI She has ongoing concerns in regards to home health We have filled out paperwork repeatedly, states she has plans to get this started.   She does note that she has faced financial hardship in paying for her utilities She needs a letter from our office stating she is eligible for discount programs through [employer] and her gas company.  No other acute concerns.  Feels stable over all.  Patient Active Problem List   Diagnosis Date Noted   Chronic pain of both knees 05/15/2020   Limited mobility 05/15/2020   At high risk for injury related to fall 05/15/2020   Urinary urgency 06/10/2016   Environmental allergies 08/14/2011   Gout 09/10/2006    DEPRESSION, MAJOR, RECURRENT 09/10/2006   Former smoker 09/10/2006   HYPERTENSION, BENIGN SYSTEMIC 09/10/2006   Mild intermittent asthma 09/10/2006   REFLUX ESOPHAGITIS 09/10/2006   Arthritis 09/10/2006    Past Medical History:  Diagnosis Date   Arthritis    Bronchitis    Gout     Current Outpatient Medications  Medication Sig Dispense Refill   acetaminophen-codeine (TYLENOL #2) 300-15 MG tablet Take 1 tablet by mouth 2 (two) times daily as needed for moderate pain or severe pain. 60 tablet 1   albuterol (PROVENTIL) (2.5 MG/3ML) 0.083% nebulizer solution Take 3 mLs (2.5 mg total) by nebulization 2 (two) times daily as needed for wheezing or shortness of breath. 150 mL 1   albuterol (VENTOLIN HFA) 108 (90 Base) MCG/ACT inhaler INHALE 2 PUFFS INTO THE LUNGS EVERY 4 HOURS AS NEEDED FOR WHEEZE OR FOR SHORTNESS OF BREATH 8.5 each 1   allopurinol (ZYLOPRIM) 100 MG tablet TAKE 1 TABLET BY MOUTH EVERY DAY 90 tablet 1   ALPRAZolam (XANAX) 0.25 MG tablet Take 0.5 tablets (0.125 mg total) by mouth at bedtime as needed for sleep. Take once in bed for the night 30 tablet 1   benzonatate (TESSALON) 100 MG capsule Take 1-2 capsules (100-200 mg total) by mouth 3 (three) times daily as needed for cough. 40 capsule 1   citalopram (CELEXA) 10 MG tablet Take 1 tablet (10 mg total) by mouth daily. 90 tablet 1   Colchicine (MITIGARE) 0.6 MG CAPS Take 0.6 mg by mouth daily. Dispense as MITIGARE 90 capsule 1  [employer] VITAMIN C 500 MG tablet TAKE 1 TABLET BY MOUTH EVERY DAY 90 tablet 3   Dextromethorphan HBr 10 MG/15ML SYRP Take 15 mLs (10 mg total) by mouth daily. 120 mL 3   FLOVENT HFA 44 MCG/ACT inhaler INHALE 2 PUFFS INTO THE LUNGS TWICE A DAY 10.6 each 5   fluticasone (FLONASE) 50 MCG/ACT nasal spray SPRAY 2 SPRAYS INTO EACH NOSTRIL EVERY DAY 48 mL 1   gabapentin (NEURONTIN) 100 MG capsule Take 1 capsule (100 mg total) by mouth 3 (three) times daily. 90 capsule 3   hydrochlorothiazide (HYDRODIURIL) 25 MG  tablet Take 1 tablet (25 mg total) by mouth daily. 90 tablet 1   lidocaine (XYLOCAINE) 5 % ointment Apply 1 application topically as needed. 35.44 g 0   methocarbamol (ROBAXIN) 500 MG tablet Take 0.5 tablets (250 mg total) by mouth 2 (two) times daily as needed for muscle spasms. 60 tablet 0   Misc. Devices (COMMODE) MISC Please dispense one rail for commode. ICD10 Z74.09 1 each 0   Misc. Devices [facility]) MISC 1 Device by Does not apply route daily. 1 each 0   Multiple Vitamin (MULTIVITAMIN WITH MINERALS) TABS tablet Take 1 tablet by mouth daily.     pantoprazole (PROTONIX) 40 MG tablet TAKE 1 TABLET BY MOUTH EVERY DAY 90 tablet 1   sennosides-docusate sodium (SENOKOT-S) 8.6-50 MG tablet Take 1 tablet by mouth daily. 90 tablet 3   sulfamethoxazole-trimethoprim (BACTRIM DS) 800-160 MG tablet Take 1 tablet by mouth 2 (two) times daily. 6 tablet 0   trolamine salicylate (ASPERCREME/ALOE) 10 % cream Apply 1 application topically as needed for muscle pain. 85 g 0   No current facility-administered medications for this visit.    Allergies  Allergen Reactions   Aspirin Nausea And Vomiting   Penicillins Swelling    Social History   Socioeconomic History   Marital status: Widowed    Spouse name: Not on file   Number of children: Not on file   Years of education: Not on file   Highest education level: Not on file  Occupational History   Not on file  Tobacco Use   Smoking status: Former    Types: Cigarettes    Quit date: 02/26/1997    Years since quitting: 24.5   Smokeless tobacco: Never  Substance and Sexual Activity   Alcohol use: No   Drug use: No   Sexual activity: Not on file  Other Topics Concern   Not on file  Social History Narrative   Patient cares for her mentally 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    Walks with a rolling walker.    Social Determinants of Health   Financial Resource Strain: Not on file  Food Insecurity: Not on  file  Transportation Needs: Not on file  Physical Activity: Not on file  Stress: Not on file  Social Connections: Not on file  Intimate Partner Violence: Not on file    ROS Per hpi  Objective   Vitals as reported by the patient: There were no vitals filed for this visit.  Lucindia was seen today for follow-up.  Diagnoses and all orders for this visit:  Limited mobility  Chronic pain of both knees  Financial insecurity    PLAN Letter written and mailed to endorse financial hardship Patient encouraged to call clinic with any questions, comments, or concerns.  I discussed the assessment and treatment plan with the patient. The patient was provided an opportunity  to ask questions and all were answered. The patient agreed with the plan and demonstrated an understanding of the instructions.   The patient was advised to call back or seek an in-person evaluation if the symptoms worsen or if the condition fails to improve as anticipated.  I provided 23 minutes of non-face-to-face time during this encounter.  [name], NP

## 2021-08-21 NOTE — Patient Instructions (Signed)
° ° ° °  If you have lab work done today you will be contacted with your lab results within the next 2 weeks.  If you have not heard from us then please contact us. The fastest way to get your results is to register for My Chart. ° ° °IF you received an x-ray today, you will receive an invoice from [facility]. Please contact [facility] at [phone] with questions or concerns regarding your invoice.  ° °IF you received labwork today, you will receive an invoice from [facility]. Please contact [facility] at [phone] with questions or concerns regarding your invoice.  ° °Our billing staff will not be able to assist you with questions regarding bills from these companies. ° °You will be contacted with the lab results as soon as they are available. The fastest way to get your results is to activate your My Chart account. Instructions are located on the last page of this paperwork. If you have not heard from us regarding the results in 2 weeks, please contact this office. °  ° ° ° °

## 2021-08-26 ENCOUNTER — Telehealth: Payer: Self-pay | Admitting: Registered Nurse

## 2021-08-26 NOTE — Telephone Encounter (Signed)
Pt called in asking to speak with a clinical member about some forms that were to be mailed to her last week.   Please advise

## 2021-08-26 NOTE — Telephone Encounter (Signed)
Pt asking about forms that were to be mailed to her?

## 2021-08-26 NOTE — Telephone Encounter (Signed)
Attempted to call Ms Klugh. Voicemail has not been set up yet.

## 2021-08-27 ENCOUNTER — Telehealth: Payer: Self-pay

## 2021-08-28 DIAGNOSIS — Z741 Need for assistance with personal care: Secondary | ICD-10-CM | POA: Diagnosis not present

## 2021-08-28 DIAGNOSIS — R3915 Urgency of urination: Secondary | ICD-10-CM | POA: Diagnosis not present

## 2021-08-28 NOTE — Telephone Encounter (Signed)
Sent,  Nehemiah Settle has called her,  Thanks,  Luan Pulling

## 2021-08-28 NOTE — Telephone Encounter (Signed)
Pt calling back about this bprogram said time was running out?

## 2021-08-28 NOTE — Telephone Encounter (Signed)
Patient states she was suppose to get a letter or form for the bprogram you both discussed.

## 2021-08-31 NOTE — Progress Notes (Signed)
Telemedicine Encounter- SOAP NOTE Established Patient  This telephone encounter was conducted with the patient's (or proxy's) verbal consent via audio telecommunications: yes/no: Yes Patient was instructed to have this encounter in a suitably private space; and to only have persons present to whom they give permission to participate. In addition, patient identity was confirmed by use of name plus two identifiers (DOB and address).  I discussed the limitations, risks, security and privacy concerns of performing an evaluation and management service by telephone and the availability of in person appointments. I also discussed with the patient that there may be a patient responsible charge related to this service. The patient expressed understanding and agreed to proceed.  I spent a total of 14 minutes talking with the patient or their proxy.  Patient at home Provider in office  Participants: [name], NP and Gus Height  Chief Complaint  Patient presents with   Medication Refill    Patient states she wants to discuss medication for pain or arthritis. Per patient she would like to start standing and walking more and wanted a providers input     Subjective   TECKLA PHENG is a 88 y.o. established patient. Telephone visit today for polyarthralgia  HPI Ongoing for some time Worst in knees No new pain, trauma, injury No falls Tries to move around as tolerated.  Does need a refill on her flonase and albuterol Uses as needed as instructed with good effect. No AE.   Patient Active Problem List   Diagnosis Date Noted   Chronic pain of both knees 05/15/2020   Limited mobility 05/15/2020   At high risk for injury related to fall 05/15/2020   Urinary urgency 06/10/2016   Environmental allergies 08/14/2011   Gout 09/10/2006   DEPRESSION, MAJOR, RECURRENT 09/10/2006   Former smoker 09/10/2006   HYPERTENSION, BENIGN SYSTEMIC 09/10/2006   Mild intermittent asthma 09/10/2006    REFLUX ESOPHAGITIS 09/10/2006   Arthritis 09/10/2006    Past Medical History:  Diagnosis Date   Arthritis    Bronchitis    Gout     Current Outpatient Medications  Medication Sig Dispense Refill   albuterol (PROVENTIL) (2.5 MG/3ML) 0.083% nebulizer solution Take 3 mLs (2.5 mg total) by nebulization 2 (two) times daily as needed for wheezing or shortness of breath. 150 mL 1   benzonatate (TESSALON) 100 MG capsule Take 1-2 capsules (100-200 mg total) by mouth 3 (three) times daily as needed for cough. 40 capsule 1   citalopram (CELEXA) 10 MG tablet Take 1 tablet (10 mg total) by mouth daily. 90 tablet 1   Colchicine (MITIGARE) 0.6 MG CAPS Take 0.6 mg by mouth daily. Dispense as MITIGARE 90 capsule 1   CVS VITAMIN C 500 MG tablet TAKE 1 TABLET BY MOUTH EVERY DAY 90 tablet 3   Dextromethorphan HBr 10 MG/15ML SYRP Take 15 mLs (10 mg total) by mouth daily. 120 mL 3   gabapentin (NEURONTIN) 100 MG capsule Take 1 capsule (100 mg total) by mouth 3 (three) times daily. 90 capsule 3   Misc. Devices (COMMODE) MISC Please dispense one rail for commode. ICD10 Z74.09 1 each 0   Misc. Devices [facility]) MISC 1 Device by Does not apply route daily. 1 each 0   Multiple Vitamin (MULTIVITAMIN WITH MINERALS) TABS tablet Take 1 tablet by mouth daily.     pantoprazole (PROTONIX) 40 MG tablet TAKE 1 TABLET BY MOUTH EVERY DAY 90 tablet 1   trolamine salicylate (ASPERCREME/ALOE) 10 % cream Apply  1 application topically as needed for muscle pain. 85 g 0   acetaminophen-codeine (TYLENOL #2) 300-15 MG tablet Take 1 tablet by mouth 2 (two) times daily as needed for moderate pain or severe pain. 60 tablet 1   albuterol (VENTOLIN HFA) 108 (90 Base) MCG/ACT inhaler INHALE 2 PUFFS INTO THE LUNGS EVERY 4 HOURS AS NEEDED FOR WHEEZE OR FOR SHORTNESS OF BREATH 8.5 each 1   allopurinol (ZYLOPRIM) 100 MG tablet TAKE 1 TABLET BY MOUTH EVERY DAY 90 tablet 1   ALPRAZolam (XANAX) 0.25 MG tablet Take 0.5 tablets (0.125 mg total)  by mouth at bedtime as needed for sleep. Take once in bed for the night 30 tablet 1   FLOVENT HFA 44 MCG/ACT inhaler INHALE 2 PUFFS INTO THE LUNGS TWICE A DAY 10.6 each 5   fluticasone (FLONASE) 50 MCG/ACT nasal spray SPRAY 2 SPRAYS INTO EACH NOSTRIL EVERY DAY 48 mL 1   hydrochlorothiazide (HYDRODIURIL) 25 MG tablet Take 1 tablet (25 mg total) by mouth daily. 90 tablet 1   lidocaine (XYLOCAINE) 5 % ointment Apply 1 application topically as needed. 35.44 g 0   methocarbamol (ROBAXIN) 500 MG tablet Take 0.5 tablets (250 mg total) by mouth 2 (two) times daily as needed for muscle spasms. 60 tablet 0   sennosides-docusate sodium (SENOKOT-S) 8.6-50 MG tablet Take 1 tablet by mouth daily. 90 tablet 3   sulfamethoxazole-trimethoprim (BACTRIM DS) 800-160 MG tablet Take 1 tablet by mouth 2 (two) times daily. 6 tablet 0   No current facility-administered medications for this visit.    Allergies  Allergen Reactions   Aspirin Nausea And Vomiting   Penicillins Swelling    Social History   Socioeconomic History   Marital status: Widowed    Spouse name: Not on file   Number of children: Not on file   Years of education: Not on file   Highest education level: Not on file  Occupational History   Not on file  Tobacco Use   Smoking status: Former    Types: Cigarettes    Quit date: 02/26/1997    Years since quitting: 24.5   Smokeless tobacco: Never  Substance and Sexual Activity   Alcohol use: No   Drug use: No   Sexual activity: Not on file  Other Topics Concern   Not on file  Social History Narrative   Patient cares for her mentally 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    Walks with a rolling walker.    Social Determinants of Health   Financial Resource Strain: Not on file  Food Insecurity: Not on file  Transportation Needs: Not on file  Physical Activity: Not on file  Stress: Not on file  Social Connections: Not on file  Intimate Partner Violence:  Not on file    ROS Per hpi   Objective   Vitals as reported by the patient: There were no vitals filed for this visit.  Tomoko was seen today for medication refill.  Diagnoses and all orders for this visit:  Osteoarthritis of multiple joints, unspecified osteoarthritis type -     Discontinue: predniSONE (DELTASONE) 5 MG tablet; Take 1 tablet (5 mg total) by mouth daily with breakfast.  Wheezing -     Discontinue: albuterol (VENTOLIN HFA) 108 (90 Base) MCG/ACT inhaler; INHALE 2 PUFFS INTO THE LUNGS EVERY 4 HOURS AS NEEDED FOR WHEEZE OR FOR SHORTNESS OF BREATH -     Discontinue: predniSONE (DELTASONE) 5 MG tablet; Take 1  tablet (5 mg total) by mouth daily with breakfast.  Persistent asthma without complication, unspecified asthma severity -     Discontinue: fluticasone (FLOVENT HFA) 44 MCG/ACT inhaler; Inhale 2 puffs into the lungs 2 (two) times daily. -     Discontinue: albuterol (VENTOLIN HFA) 108 (90 Base) MCG/ACT inhaler; INHALE 2 PUFFS INTO THE LUNGS EVERY 4 HOURS AS NEEDED FOR WHEEZE OR FOR SHORTNESS OF BREATH   PLAN Prednisone for pain. Discussed risks, she agrees to plan Refill albuterol and flonase In office visit needed soon Patient encouraged to call clinic with any questions, comments, or concerns.   I discussed the assessment and treatment plan with the patient. The patient was provided an opportunity to ask questions and all were answered. The patient agreed with the plan and demonstrated an understanding of the instructions.   The patient was advised to call back or seek an in-person evaluation if the symptoms worsen or if the condition fails to improve as anticipated.  I provided 14 minutes of non-face-to-face time during this encounter.  [name], NP

## 2021-09-06 ENCOUNTER — Other Ambulatory Visit: Payer: Self-pay | Admitting: Registered Nurse

## 2021-09-06 DIAGNOSIS — M159 Polyosteoarthritis, unspecified: Secondary | ICD-10-CM

## 2021-09-25 DIAGNOSIS — R3915 Urgency of urination: Secondary | ICD-10-CM | POA: Diagnosis not present

## 2021-09-25 DIAGNOSIS — Z741 Need for assistance with personal care: Secondary | ICD-10-CM | POA: Diagnosis not present

## 2021-09-27 ENCOUNTER — Telehealth: Payer: Self-pay | Admitting: Registered Nurse

## 2021-09-27 NOTE — Telephone Encounter (Signed)
I have received a medical cert. Form from the patient via fax. I have placed this in the bin upfront with a charge sheet  ?

## 2021-09-27 NOTE — Telephone Encounter (Signed)
Patient form is in the back folder to be signed ?

## 2021-10-01 NOTE — Telephone Encounter (Signed)
Patient called in about the form asking if we have received it. Advised we had and let her know the turn around time for it to be completed. Patient asked if we could call her once it has been faxed.  ?

## 2021-10-02 NOTE — Telephone Encounter (Signed)
I called patient  and let her know that we are faxing her paperwork at this time ?

## 2021-10-02 NOTE — Telephone Encounter (Signed)
Patient states she received a letter from the company that states she has until Monday to have those forms filled out and patient is worried.  ?

## 2021-10-03 ENCOUNTER — Other Ambulatory Visit: Payer: Self-pay

## 2021-10-03 DIAGNOSIS — R12 Heartburn: Secondary | ICD-10-CM

## 2021-10-03 MED ORDER — PANTOPRAZOLE SODIUM 40 MG PO TBEC: 40.0000 mg | DELAYED_RELEASE_TABLET | Freq: Every day | ORAL | 1 refills | Status: DC

## 2021-10-10 ENCOUNTER — Ambulatory Visit (INDEPENDENT_AMBULATORY_CARE_PROVIDER_SITE_OTHER): Payer: Medicare HMO

## 2021-10-10 DIAGNOSIS — Z0000 Encounter for general adult medical examination without abnormal findings: Secondary | ICD-10-CM | POA: Diagnosis not present

## 2021-10-10 DIAGNOSIS — Z0100 Encounter for examination of eyes and vision without abnormal findings: Secondary | ICD-10-CM

## 2021-10-10 NOTE — Progress Notes (Signed)
? ?Subjective:  ? Amy HeightLouise D Gallagher is a 88 y.o. female who presents for an Initial Medicare Annual Wellness Visit. ? ?I connected with Amy Gallagher today by telephone and verified that I am speaking with the correct person using two identifiers. ?Location patient: home ?Location provider: work ?Persons participating in the virtual visit: patient, provider. ?  ?I discussed the limitations, risks, security and privacy concerns of performing an evaluation and management service by telephone and the availability of in person appointments. I also discussed with the patient that there may be a patient responsible charge related to this service. The patient expressed understanding and verbally consented to this telephonic visit.  ?  ?Interactive audio and video telecommunications were attempted between this provider and patient, however failed, due to patient having technical difficulties OR patient did not have access to video capability.  We continued and completed visit with audio only. ? ?  ?Review of Systems    ? ?Cardiac Risk Factors include: advanced age (>6655men, 16>65 women);dyslipidemia;hypertension ? ?   ?Objective:  ?  ?Today's Vitals  ? ?There is no Gallagher or weight on file to calculate BMI. ? ? ?  10/10/2021  ? 12:51 PM 06/24/2019  ?  2:10 PM  ?Advanced Directives  ?Does Patient Have a Medical Advance Directive? No Yes  ?Type of Special educational needs teacherAdvance Directive  Healthcare Power of [place] GrassAttorney;Living will  ?Would patient like information on creating a medical advance directive? No - Patient declined   ? ? ?Current Medications (verified) ?Outpatient Encounter Medications as of 10/10/2021  ?Medication Sig  ? acetaminophen-codeine (TYLENOL #2) 300-15 MG tablet Take 1 tablet by mouth 2 (two) times daily as needed for moderate pain or severe pain.  ? albuterol (PROVENTIL) (2.5 MG/3ML) 0.083% nebulizer solution Take 3 mLs (2.5 mg total) by nebulization 2 (two) times daily as needed for wheezing or shortness of breath.  ? albuterol  (VENTOLIN HFA) 108 (90 Base) MCG/ACT inhaler INHALE 2 PUFFS INTO THE LUNGS EVERY 4 HOURS AS NEEDED FOR WHEEZE OR FOR SHORTNESS OF BREATH  ? allopurinol (ZYLOPRIM) 100 MG tablet TAKE 1 TABLET BY MOUTH EVERY DAY  ? ALPRAZolam (XANAX) 0.25 MG tablet Take 0.5 tablets (0.125 mg total) by mouth at bedtime as needed for sleep. Take once in bed for the night  ? benzonatate (TESSALON) 100 MG capsule Take 1-2 capsules (100-200 mg total) by mouth 3 (three) times daily as needed for cough.  ? citalopram (CELEXA) 10 MG tablet Take 1 tablet (10 mg total) by mouth daily.  ? Colchicine (MITIGARE) 0.6 MG CAPS Take 0.6 mg by mouth daily. Dispense as MITIGARE  ? [employer] VITAMIN C 500 MG tablet TAKE 1 TABLET BY MOUTH EVERY DAY  ? Dextromethorphan HBr 10 MG/15ML SYRP Take 15 mLs (10 mg total) by mouth daily.  ? FLOVENT HFA 44 MCG/ACT inhaler INHALE 2 PUFFS INTO THE LUNGS TWICE A DAY  ? fluticasone (FLONASE) 50 MCG/ACT nasal spray SPRAY 2 SPRAYS INTO EACH NOSTRIL EVERY DAY  ? gabapentin (NEURONTIN) 100 MG capsule Take 1 capsule (100 mg total) by mouth 3 (three) times daily.  ? hydrochlorothiazide (HYDRODIURIL) 25 MG tablet Take 1 tablet (25 mg total) by mouth daily.  ? lidocaine (LIDODERM) 5 % PLACE 1 PATCH ONTO THE SKIN DAILY. REMOVE & DISCARD PATCH WITHIN 12 HOURS OR AS DIRECTED BY MD. PA DENIED  ? lidocaine (XYLOCAINE) 5 % ointment Apply 1 application topically as needed.  ? methocarbamol (ROBAXIN) 500 MG tablet TAKE 0.5 TABLETS (250 MG TOTAL) BY MOUTH 2 (  TWO) TIMES DAILY AS NEEDED FOR MUSCLE SPASMS.  ? Misc. Devices (COMMODE) MISC Please dispense one rail for commode. ICD10 Z74.09  ? Misc. Devices [facility]) MISC 1 Device by Does not apply route daily.  ? Multiple Vitamin (MULTIVITAMIN WITH MINERALS) TABS tablet Take 1 tablet by mouth daily.  ? pantoprazole (PROTONIX) 40 MG tablet Take 1 tablet (40 mg total) by mouth daily.  ? sennosides-docusate sodium (SENOKOT-S) 8.6-50 MG tablet Take 1 tablet by mouth daily.  ?  sulfamethoxazole-trimethoprim (BACTRIM DS) 800-160 MG tablet Take 1 tablet by mouth 2 (two) times daily.  ? trolamine salicylate (ASPERCREME/ALOE) 10 % cream Apply 1 application topically as needed for muscle pain.  ? ?No facility-administered encounter medications on file as of 10/10/2021.  ? ? ?Allergies (verified) ?Aspirin and Penicillins  ? ?History: ?Past Medical History:  ?Diagnosis Date  ? Arthritis   ? Bronchitis   ? Gout   ? ?Past Surgical History:  ?Procedure Laterality Date  ? SALPINGECTOMY    ? right  ? ?History reviewed. No pertinent family history. ?Social History  ? ?Socioeconomic History  ? Marital status: Widowed  ?  Spouse name: Not on file  ? Number of children: Not on file  ? Years of education: Not on file  ? Highest education level: Not on file  ?Occupational History  ? Not on file  ?Tobacco Use  ? Smoking status: Former  ?  Types: Cigarettes  ?  Quit date: 02/26/1997  ?  Years since quitting: 24.6  ? Smokeless tobacco: Never  ?Substance and Sexual Activity  ? Alcohol use: No  ? Drug use: No  ? Sexual activity: Not on file  ?Other Topics Concern  ? Not on file  ?Social History Narrative  ? Patient cares for her mentally retarded daughter, son and grandson    ? Patient's husband is deceased.   ? Walks with a rolling walker.   ? ?Social Determinants of Health  ? ?Financial Resource Strain: Low Risk   ? Difficulty of Paying Living Expenses: Not hard at all  ?Food Insecurity: No Food Insecurity  ? Worried About Programme researcher, broadcasting/film/video in the Last Year: Never true  ? Ran Out of Food in the Last Year: Never true  ?Transportation Needs: No Transportation Needs  ? Lack of Transportation (Medical): No  ? Lack of Transportation (Non-Medical): No  ?Physical Activity: Inactive  ? Days of Exercise per Week: 0 days  ? Minutes of Exercise per Session: 0 min  ?Stress: No Stress Concern Present  ? Feeling of Stress : Not at all  ?Social Connections: Socially Isolated  ? Frequency of Communication with Friends and  Family: Twice a week  ? Frequency of Social Gatherings with Friends and Family: Twice a week  ? Attends Religious Services: Never  ? Active Member of Clubs or Organizations: No  ? Attends Banker Meetings: Never  ? Marital Status: Widowed  ? ? ?Tobacco Counseling ?Counseling given: Not Answered ? ? ?Clinical Intake: ? ?Pre-visit preparation completed: Yes ? ?Pain : No/denies pain ? ?  ? ?Nutritional Risks: None ?Diabetes: No ? ?How often do you need to have someone help you when you read instructions, pamphlets, or other written materials from your doctor or pharmacy?: 1 - Never ?What is the last grade level you completed in school?: High School ? ?Diabetic?no  ? ?Interpreter Needed?: No ? ?Information entered by :: L.Sriya Kroeze,LPN ? ? ?Activities of Daily Living ? ?  10/10/2021  ? 12:52 PM  ?  In your present state of health, do you have any difficulty performing the following activities:  ?Hearing? 1  ?Vision? 0  ?Difficulty concentrating or making decisions? 0  ?Walking or climbing stairs? 1  ?Comment wheel chair  ?Dressing or bathing? 1  ?Comment has assistence  ?Doing errands, shopping? 1  ?Comment has assistance  ?Preparing Food and eating ? N  ?Using the Toilet? N  ?In the past six months, have you accidently leaked urine? N  ?Do you have problems with loss of bowel control? N  ?Managing your Medications? Y  ?Comment has assistance  ?Managing your Finances? Y  ?Housekeeping or managing your Housekeeping? Y  ?Comment has assistance  ? ? ?Patient Care Team: ?[name], NP as PCP - General (Adult Health Nurse Practitioner) ? ?Indicate any recent Medical Services you may have received from other than [facility] providers in the past year (date may be approximate). ? ?   ?Assessment:  ? This is a routine wellness examination for [place]. ? ?Hearing/Vision screen ?Vision Screening - Comments:: Referral 10/10/2021 ? ?Dietary issues and exercise activities discussed: ?Current Exercise Habits: The patient does  not participate in regular exercise at present, Exercise limited by: Other - see comments (wheel chair) ? ? Goals Addressed   ?None ?  ? ?Depression Screen ? ?  10/10/2021  ? 12:52 PM 10/10/2021  ? 12:49 PM 08/21/2021  ? 10:19 AM 12/15

## 2021-10-10 NOTE — Patient Instructions (Signed)
Amy Gallagher , ?Thank you for taking time to come for your Medicare Wellness Visit. I appreciate your ongoing commitment to your health goals. Please review the following plan we discussed and let me know if I can assist you in the future.  ? ?Screening recommendations/referrals: ?Colonoscopy: no longer required  ?Mammogram: no longer required  ?Bone Density: declined  ?Recommended yearly ophthalmology/optometry visit for glaucoma screening and checkup ?Recommended yearly dental visit for hygiene and checkup ? ?Vaccinations: ?Influenza vaccine: due  ?Pneumococcal vaccine: completed  ?Tdap vaccine: due  ?Shingles vaccine: will consider    ? ?Advanced directives: none  ? ?Conditions/risks identified: none  ? ?Next appointment: none  ? ? ?Preventive Care 86 Years and Older, Female ?Preventive care refers to lifestyle choices and visits with your health care provider that can promote health and wellness. ?What does preventive care include? ?A yearly physical exam. This is also called an annual well check. ?Dental exams once or twice a year. ?Routine eye exams. Ask your health care provider how often you should have your eyes checked. ?Personal lifestyle choices, including: ?Daily care of your teeth and gums. ?Regular physical activity. ?Eating a healthy diet. ?Avoiding tobacco and drug use. ?Limiting alcohol use. ?Practicing safe sex. ?Taking low-dose aspirin every day. ?Taking vitamin and mineral supplements as recommended by your health care provider. ?What happens during an annual well check? ?The services and screenings done by your health care provider during your annual well check will depend on your age, overall health, lifestyle risk factors, and family history of disease. ?Counseling  ?Your health care provider may ask you questions about your: ?Alcohol use. ?Tobacco use. ?Drug use. ?Emotional well-being. ?Home and relationship well-being. ?Sexual activity. ?Eating habits. ?History of falls. ?Memory and ability  to understand (cognition). ?Work and work Astronomer. ?Reproductive health. ?Screening  ?You may have the following tests or measurements: ?Height, weight, and BMI. ?Blood pressure. ?Lipid and cholesterol levels. These may be checked every 5 years, or more frequently if you are over 57 years old. ?Skin check. ?Lung cancer screening. You may have this screening every year starting at age 74 if you have a 30-pack-year history of smoking and currently smoke or have quit within the past 15 years. ?Fecal occult blood test (FOBT) of the stool. You may have this test every year starting at age 63. ?Flexible sigmoidoscopy or colonoscopy. You may have a sigmoidoscopy every 5 years or a colonoscopy every 10 years starting at age 96. ?Hepatitis C blood test. ?Hepatitis B blood test. ?Sexually transmitted disease (STD) testing. ?Diabetes screening. This is done by checking your blood sugar (glucose) after you have not eaten for a while (fasting). You may have this done every 1-3 years. ?Bone density scan. This is done to screen for osteoporosis. You may have this done starting at age 55. ?Mammogram. This may be done every 1-2 years. Talk to your health care provider about how often you should have regular mammograms. ?Talk with your health care provider about your test results, treatment options, and if necessary, the need for more tests. ?Vaccines  ?Your health care provider may recommend certain vaccines, such as: ?Influenza vaccine. This is recommended every year. ?Tetanus, diphtheria, and acellular pertussis (Tdap, Td) vaccine. You may need a Td booster every 10 years. ?Zoster vaccine. You may need this after age 26. ?Pneumococcal 13-valent conjugate (PCV13) vaccine. One dose is recommended after age 30. ?Pneumococcal polysaccharide (PPSV23) vaccine. One dose is recommended after age 11. ?Talk to your health care provider about which  screenings and vaccines you need and how often you need them. ?This information is not  intended to replace advice given to you by your health care provider. Make sure you discuss any questions you have with your health care provider. ?Document Released: 07/27/2015 Document Revised: 03/19/2016 Document Reviewed: 05/01/2015 ?Elsevier Interactive Patient Education ? 2017 [employer] ? ?Fall Prevention in the Home ?Falls can cause injuries. They can happen to people of all ages. There are many things you can do to make your home safe and to help prevent falls. ?What can I do on the outside of my home? ?Regularly fix the edges of walkways and driveways and fix any cracks. ?Remove anything that might make you trip as you walk through a door, such as a raised step or threshold. ?Trim any bushes or trees on the path to your home. ?Use bright outdoor lighting. ?Clear any walking paths of anything that might make someone trip, such as rocks or tools. ?Regularly check to see if handrails are loose or broken. Make sure that both sides of any steps have handrails. ?Any raised decks and porches should have guardrails on the edges. ?Have any leaves, snow, or ice cleared regularly. ?Use sand or salt on walking paths during winter. ?Clean up any spills in your garage right away. This includes oil or grease spills. ?What can I do in the bathroom? ?Use night lights. ?Install grab bars by the toilet and in the tub and shower. Do not use towel bars as grab bars. ?Use non-skid mats or decals in the tub or shower. ?If you need to sit down in the shower, use a plastic, non-slip stool. ?Keep the floor dry. Clean up any water that spills on the floor as soon as it happens. ?Remove soap buildup in the tub or shower regularly. ?Attach bath mats securely with double-sided non-slip rug tape. ?Do not have throw rugs and other things on the floor that can make you trip. ?What can I do in the bedroom? ?Use night lights. ?Make sure that you have a light by your bed that is easy to reach. ?Do not use any sheets or blankets that are  too big for your bed. They should not hang down onto the floor. ?Have a firm chair that has side arms. You can use this for support while you get dressed. ?Do not have throw rugs and other things on the floor that can make you trip. ?What can I do in the kitchen? ?Clean up any spills right away. ?Avoid walking on wet floors. ?Keep items that you use a lot in easy-to-reach places. ?If you need to reach something above you, use a strong step stool that has a grab bar. ?Keep electrical cords out of the way. ?Do not use floor polish or wax that makes floors slippery. If you must use wax, use non-skid floor wax. ?Do not have throw rugs and other things on the floor that can make you trip. ?What can I do with my stairs? ?Do not leave any items on the stairs. ?Make sure that there are handrails on both sides of the stairs and use them. Fix handrails that are broken or loose. Make sure that handrails are as long as the stairways. ?Check any carpeting to make sure that it is firmly attached to the stairs. Fix any carpet that is loose or worn. ?Avoid having throw rugs at the top or bottom of the stairs. If you do have throw rugs, attach them to the floor with carpet  tape. ?Make sure that you have a light switch at the top of the stairs and the bottom of the stairs. If you do not have them, ask someone to add them for you. ?What else can I do to help prevent falls? ?Wear shoes that: ?Do not have high heels. ?Have rubber bottoms. ?Are comfortable and fit you well. ?Are closed at the toe. Do not wear sandals. ?If you use a stepladder: ?Make sure that it is fully opened. Do not climb a closed stepladder. ?Make sure that both sides of the stepladder are locked into place. ?Ask someone to hold it for you, if possible. ?Clearly mark and make sure that you can see: ?Any grab bars or handrails. ?First and last steps. ?Where the edge of each step is. ?Use tools that help you move around (mobility aids) if they are needed. These  include: ?Canes. ?Walkers. ?Scooters. ?Crutches. ?Turn on the lights when you go into a dark area. Replace any light bulbs as soon as they burn out. ?Set up your furniture so you have a clear path. Avoid moving y

## 2021-10-28 DIAGNOSIS — Z741 Need for assistance with personal care: Secondary | ICD-10-CM | POA: Diagnosis not present

## 2021-10-28 DIAGNOSIS — R3915 Urgency of urination: Secondary | ICD-10-CM | POA: Diagnosis not present

## 2021-11-10 ENCOUNTER — Other Ambulatory Visit: Payer: Self-pay | Admitting: Registered Nurse

## 2021-11-10 DIAGNOSIS — I10 Essential (primary) hypertension: Secondary | ICD-10-CM

## 2021-11-22 ENCOUNTER — Encounter: Payer: Self-pay | Admitting: Registered Nurse

## 2021-11-26 DIAGNOSIS — Z741 Need for assistance with personal care: Secondary | ICD-10-CM | POA: Diagnosis not present

## 2021-11-26 DIAGNOSIS — R3915 Urgency of urination: Secondary | ICD-10-CM | POA: Diagnosis not present

## 2021-12-03 ENCOUNTER — Telehealth: Payer: Self-pay

## 2021-12-03 NOTE — Telephone Encounter (Signed)
Sent office notes to scan.

## 2021-12-04 ENCOUNTER — Telehealth: Payer: Self-pay

## 2021-12-04 NOTE — Telephone Encounter (Signed)
Given to [name], NP for review and will be sent to scan once reviewed.

## 2021-12-08 ENCOUNTER — Other Ambulatory Visit: Payer: Self-pay | Admitting: Registered Nurse

## 2021-12-08 DIAGNOSIS — I10 Essential (primary) hypertension: Secondary | ICD-10-CM

## 2021-12-16 ENCOUNTER — Other Ambulatory Visit: Payer: Self-pay | Admitting: Registered Nurse

## 2021-12-16 DIAGNOSIS — J45909 Unspecified asthma, uncomplicated: Secondary | ICD-10-CM

## 2021-12-16 DIAGNOSIS — M109 Gout, unspecified: Secondary | ICD-10-CM

## 2021-12-16 DIAGNOSIS — M159 Polyosteoarthritis, unspecified: Secondary | ICD-10-CM

## 2021-12-21 ENCOUNTER — Other Ambulatory Visit: Payer: Self-pay | Admitting: Registered Nurse

## 2021-12-21 DIAGNOSIS — I10 Essential (primary) hypertension: Secondary | ICD-10-CM

## 2021-12-25 ENCOUNTER — Telehealth: Payer: Self-pay | Admitting: Registered Nurse

## 2021-12-25 NOTE — Telephone Encounter (Signed)
error 

## 2021-12-26 DIAGNOSIS — Z741 Need for assistance with personal care: Secondary | ICD-10-CM | POA: Diagnosis not present

## 2021-12-26 DIAGNOSIS — N3946 Mixed incontinence: Secondary | ICD-10-CM | POA: Diagnosis not present

## 2021-12-26 DIAGNOSIS — R3915 Urgency of urination: Secondary | ICD-10-CM | POA: Diagnosis not present

## 2022-01-05 ENCOUNTER — Other Ambulatory Visit: Payer: Self-pay | Admitting: Registered Nurse

## 2022-01-05 DIAGNOSIS — I10 Essential (primary) hypertension: Secondary | ICD-10-CM

## 2022-01-23 DIAGNOSIS — Z741 Need for assistance with personal care: Secondary | ICD-10-CM | POA: Diagnosis not present

## 2022-01-23 DIAGNOSIS — R3915 Urgency of urination: Secondary | ICD-10-CM | POA: Diagnosis not present

## 2022-01-23 DIAGNOSIS — N3946 Mixed incontinence: Secondary | ICD-10-CM | POA: Diagnosis not present

## 2022-02-07 ENCOUNTER — Telehealth: Payer: Self-pay | Admitting: Registered Nurse

## 2022-02-07 NOTE — Telephone Encounter (Signed)
error 

## 2022-02-12 ENCOUNTER — Other Ambulatory Visit: Payer: Self-pay

## 2022-02-12 ENCOUNTER — Telehealth (INDEPENDENT_AMBULATORY_CARE_PROVIDER_SITE_OTHER): Payer: Medicare HMO | Admitting: Registered Nurse

## 2022-02-12 ENCOUNTER — Encounter: Payer: Self-pay | Admitting: Registered Nurse

## 2022-02-12 DIAGNOSIS — K5904 Chronic idiopathic constipation: Secondary | ICD-10-CM | POA: Diagnosis not present

## 2022-02-12 DIAGNOSIS — I10 Essential (primary) hypertension: Secondary | ICD-10-CM

## 2022-02-12 DIAGNOSIS — M109 Gout, unspecified: Secondary | ICD-10-CM

## 2022-02-12 DIAGNOSIS — M10071 Idiopathic gout, right ankle and foot: Secondary | ICD-10-CM

## 2022-02-12 DIAGNOSIS — M25561 Pain in right knee: Secondary | ICD-10-CM

## 2022-02-12 DIAGNOSIS — G8929 Other chronic pain: Secondary | ICD-10-CM

## 2022-02-12 DIAGNOSIS — R062 Wheezing: Secondary | ICD-10-CM | POA: Insufficient documentation

## 2022-02-12 DIAGNOSIS — M25562 Pain in left knee: Secondary | ICD-10-CM | POA: Diagnosis not present

## 2022-02-12 DIAGNOSIS — J45909 Unspecified asthma, uncomplicated: Secondary | ICD-10-CM | POA: Diagnosis not present

## 2022-02-12 DIAGNOSIS — F439 Reaction to severe stress, unspecified: Secondary | ICD-10-CM

## 2022-02-12 DIAGNOSIS — R12 Heartburn: Secondary | ICD-10-CM | POA: Diagnosis not present

## 2022-02-12 MED ORDER — FLUTICASONE PROPIONATE HFA 44 MCG/ACT IN AERO: INHALATION_SPRAY | RESPIRATORY_TRACT | 5 refills | Status: DC

## 2022-02-12 MED ORDER — CITALOPRAM HYDROBROMIDE 10 MG PO TABS: 10.0000 mg | ORAL_TABLET | Freq: Every day | ORAL | 1 refills | Status: DC

## 2022-02-12 MED ORDER — PANTOPRAZOLE SODIUM 40 MG PO TBEC: 40.0000 mg | DELAYED_RELEASE_TABLET | Freq: Every day | ORAL | 1 refills | Status: DC

## 2022-02-12 MED ORDER — LIDOCAINE 5 % EX PTCH: MEDICATED_PATCH | CUTANEOUS | 1 refills | Status: DC

## 2022-02-12 MED ORDER — FLUTICASONE PROPIONATE 50 MCG/ACT NA SUSP: NASAL | 1 refills | Status: DC

## 2022-02-12 MED ORDER — BENZONATATE 100 MG PO CAPS: 100.0000 mg | ORAL_CAPSULE | Freq: Three times a day (TID) | ORAL | 1 refills | Status: DC | PRN

## 2022-02-12 MED ORDER — PROMETHAZINE HCL 6.25 MG/5ML PO SYRP: 6.2500 mg | ORAL_SOLUTION | Freq: Every day | ORAL | 0 refills | Status: DC | PRN

## 2022-02-12 MED ORDER — LIDOCAINE 5 % EX OINT: 1.0000 | TOPICAL_OINTMENT | CUTANEOUS | 0 refills | Status: DC | PRN

## 2022-02-12 MED ORDER — ALPRAZOLAM 0.25 MG PO TABS: 0.1250 mg | ORAL_TABLET | Freq: Every evening | ORAL | 1 refills | Status: DC | PRN

## 2022-02-12 MED ORDER — ALBUTEROL SULFATE (2.5 MG/3ML) 0.083% IN NEBU: 2.5000 mg | INHALATION_SOLUTION | Freq: Two times a day (BID) | RESPIRATORY_TRACT | 1 refills | Status: DC | PRN

## 2022-02-12 MED ORDER — ALBUTEROL SULFATE HFA 108 (90 BASE) MCG/ACT IN AERS: INHALATION_SPRAY | RESPIRATORY_TRACT | 1 refills | Status: DC

## 2022-02-12 MED ORDER — ASPERCREME/ALOE 10 % EX CREA: 1.0000 | TOPICAL_CREAM | CUTANEOUS | 0 refills | Status: DC | PRN

## 2022-02-12 MED ORDER — GABAPENTIN 100 MG PO CAPS: 100.0000 mg | ORAL_CAPSULE | Freq: Three times a day (TID) | ORAL | 3 refills | Status: DC

## 2022-02-12 MED ORDER — ALLOPURINOL 100 MG PO TABS: 100.0000 mg | ORAL_TABLET | Freq: Every day | ORAL | 1 refills | Status: DC

## 2022-02-12 MED ORDER — HYDROCHLOROTHIAZIDE 25 MG PO TABS: 25.0000 mg | ORAL_TABLET | Freq: Every day | ORAL | 0 refills | Status: DC

## 2022-02-12 MED ORDER — SENNA-DOCUSATE SODIUM 8.6-50 MG PO TABS: 1.0000 | ORAL_TABLET | Freq: Every day | ORAL | 3 refills | Status: DC

## 2022-02-12 NOTE — Patient Instructions (Addendum)
Ms. Ground -  It has been a pleasure taking part in your care.   I would like to you be seen in office to establish with one of the following providers within the next 4-6 weeks:  Rodman Pickle, DNP Alysia Penna, NP Herbie Drape, MD  Jiles Prows, DNP Hillard Danker, MD Edwina Barth, MD   I wish you nothing but the best going forward!  Thank you,  Rich    If you have lab work done today you will be contacted with your lab results within the next 2 weeks.  If you have not heard from [place] then please contact us. The fastest way to get your results is to register for My Chart.   IF you received an x-ray today, you will receive an invoice from [facility]. Please contact [facility] at [phone] with questions or concerns regarding your invoice.   IF you received labwork today, you will receive an invoice from [facility]. Please contact [facility] at [phone] with questions or concerns regarding your invoice.   Our billing staff will not be able to assist you with questions regarding bills from these companies.  You will be contacted with the lab results as soon as they are available. The fastest way to get your results is to activate your My Chart account. Instructions are located on the last page of this paperwork. If you have not heard from [place] regarding the results in 2 weeks, please contact this office.

## 2022-02-12 NOTE — Progress Notes (Signed)
Telemedicine Encounter- SOAP NOTE Established Patient  This telephone encounter was conducted with the patient's (or proxy's) verbal consent via audio telecommunications: yes/no: Yes Patient was instructed to have this encounter in a suitably private space; and to only have persons present to whom they give permission to participate. In addition, patient identity was confirmed by use of name plus two identifiers (DOB and address).  I discussed the limitations, risks, security and privacy concerns of performing an evaluation and management service by telephone and the availability of in person appointments. I also discussed with the patient that there may be a patient responsible charge related to this service. The patient expressed understanding and agreed to proceed.  I spent a total of 17 minutes talking with the patient or their proxy.  Patient at home Provider in office  Participants: [name], NP and Gus Height  Chief Complaint  Patient presents with   Medication Refill    Patient states she need a medication refill.Patient wants to discuss getting back on tromethazine syrup and home health nurse.    Subjective   Amy Gallagher is a 88 y.o. established patient. Telephone visit today for follow up   HPI Med refills:  Chronic Pain both knees/OA Uses rotation of lidocaine ointment, patches. Gabapentin 100mg  po tid. Has used tylenol #3 and methocarbamol in the past, but tylenol OTC ok now. Continues to rely on wheelchair for transportation  Wheezing/Chronic Bronchitis Uses albuterol inhaler, nebulizer. Has been on dextromethorphan with limited effect. Has used phenergan syrup in the past with more effect Notes she used this 1-2 times weekly and it worked Would like to resume this.  Gout Seldom has flares. Continues allopurinol daily with no AE. Would like to continue.  Stress Over health, wellness, ADL Uses alprazolam PRN. Understands risk. She mostly uses  at night to help sleep Pdmp consulted, no concerns   Hypertension: Patient Currently taking: hctz 25mg  po qd Good effect. No AEs. Denies CV symptoms including: chest pain, shob, doe, headache, visual changes, fatigue, claudication, and dependent edema.   Previous readings and labs: BP Readings from Last 3 Encounters:  08/09/19 (!) 165/76  06/24/19 (!) 156/75  03/17/18 134/64   Lab Results  Component Value Date   CREATININE 0.86 08/09/2019    GERD, Constipation Continue ppi and laxative. Advised to stay well hydrated. No new complications or concerns.   Patient Active Problem List   Diagnosis Date Noted   Wheezing 02/12/2022   Persistent asthma without complication 02/12/2022   Stress 02/12/2022   Heartburn 02/12/2022   Chronic idiopathic constipation 02/12/2022   Chronic pain of both knees 05/15/2020   Limited mobility 05/15/2020   At high risk for injury related to fall 05/15/2020   Urinary urgency 06/10/2016   Environmental allergies 08/14/2011   Gout 09/10/2006   DEPRESSION, MAJOR, RECURRENT 09/10/2006   Former smoker 09/10/2006   Essential hypertension 09/10/2006   Mild intermittent asthma 09/10/2006   REFLUX ESOPHAGITIS 09/10/2006   Arthritis 09/10/2006    Past Medical History:  Diagnosis Date   Arthritis    Bronchitis    Gout     Current Outpatient Medications  Medication Sig Dispense Refill   Colchicine (MITIGARE) 0.6 MG CAPS Take 0.6 mg by mouth daily. Dispense as MITIGARE 90 capsule 1   [facility] VITAMIN C 500 MG tablet TAKE 1 TABLET BY MOUTH EVERY DAY 90 tablet 3   Dextromethorphan HBr 10 MG/15ML SYRP Take 15 mLs (10 mg total) by mouth daily. 120 mL  3   methocarbamol (ROBAXIN) 500 MG tablet TAKE 0.5 TABLETS (250 MG TOTAL) BY MOUTH 2 (TWO) TIMES DAILY AS NEEDED FOR MUSCLE SPASMS. 60 tablet 0   Misc. Devices (COMMODE) MISC Please dispense one rail for commode. ICD10 Z74.09 1 each 0   Misc. Devices [facility]) MISC 1 Device by Does not apply route daily. 1  each 0   Multiple Vitamin (MULTIVITAMIN WITH MINERALS) TABS tablet Take 1 tablet by mouth daily.     promethazine (PHENERGAN) 6.25 MG/5ML syrup Take 5 mLs (6.25 mg total) by mouth daily as needed for nausea or vomiting. 120 mL 0   albuterol (PROVENTIL) (2.5 MG/3ML) 0.083% nebulizer solution Take 3 mLs (2.5 mg total) by nebulization 2 (two) times daily as needed for wheezing or shortness of breath. 150 mL 1   albuterol (VENTOLIN HFA) 108 (90 Base) MCG/ACT inhaler Inhale 2 puffs into the lungs every 4 hours as needed for wheezing or shortness of breath 8.5 each 1   allopurinol (ZYLOPRIM) 100 MG tablet Take 1 tablet (100 mg total) by mouth daily. 90 tablet 1   ALPRAZolam (XANAX) 0.25 MG tablet Take 0.5 tablets (0.125 mg total) by mouth at bedtime as needed for sleep. Take once in bed for the night 30 tablet 1   benzonatate (TESSALON) 100 MG capsule Take 1-2 capsules (100-200 mg total) by mouth 3 (three) times daily as needed for cough. 40 capsule 1   citalopram (CELEXA) 10 MG tablet Take 1 tablet (10 mg total) by mouth daily. 90 tablet 1   fluticasone (FLONASE) 50 MCG/ACT nasal spray SPRAY 2 SPRAYS INTO EACH NOSTRIL EVERY DAY 48 mL 1   fluticasone (FLOVENT HFA) 44 MCG/ACT inhaler INHALE 2 PUFFS INTO THE LUNGS TWICE A DAY 10.6 each 5   gabapentin (NEURONTIN) 100 MG capsule Take 1 capsule (100 mg total) by mouth 3 (three) times daily. 90 capsule 3   hydrochlorothiazide (HYDRODIURIL) 25 MG tablet Take 1 tablet (25 mg total) by mouth daily. 90 tablet 0   lidocaine (LIDODERM) 5 % PLACE 1 PATCH ONTO THE SKIN DAILY. REMOVE & DISCARD PATCH WITHIN 12 HOURS OR AS DIRECTED BY MD. PA DENIED 30 patch 1   lidocaine (XYLOCAINE) 5 % ointment Apply 1 Application topically as needed. 35.44 g 0   pantoprazole (PROTONIX) 40 MG tablet Take 1 tablet (40 mg total) by mouth daily. 90 tablet 1   sennosides-docusate sodium (SENOKOT-S) 8.6-50 MG tablet Take 1 tablet by mouth daily. 90 tablet 3   trolamine salicylate  (ASPERCREME/ALOE) 10 % cream Apply 1 Application topically as needed for muscle pain. 85 g 0   No current facility-administered medications for this visit.    Allergies  Allergen Reactions   Aspirin Nausea And Vomiting   Penicillins Swelling    Social History   Socioeconomic History   Marital status: Widowed    Spouse name: Not on file   Number of children: Not on file   Years of education: Not on file   Highest education level: Not on file  Occupational History   Not on file  Tobacco Use   Smoking status: Former    Types: Cigarettes    Quit date: 02/26/1997    Years since quitting: 24.9   Smokeless tobacco: Never  Substance and Sexual Activity   Alcohol use: No   Drug use: No   Sexual activity: Not on file  Other Topics Concern   Not on file  Social History Narrative   Patient cares for her mentally retarded daughter,  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    Walks with a rolling walker.    Social Determinants of Health   Financial Resource Strain: Low Risk  (10/10/2021)   Overall Financial Resource Strain (CARDIA)    Difficulty of Paying Living Expenses: Not hard at all  Food Insecurity: No Food Insecurity (10/10/2021)   Hunger Vital Sign    Worried About Running Out of Food in the Last Year: Never true    Ran Out of Food in the Last Year: Never true  Transportation Needs: No Transportation Needs (10/10/2021)   PRAPARE - Administrator, Civil Service (Medical): No    Lack of Transportation (Non-Medical): No  Physical Activity: Inactive (10/10/2021)   Exercise Vital Sign    Days of Exercise per Week: 0 days    Minutes of Exercise per Session: 0 min  Stress: No Stress Concern Present (10/10/2021)   [employer] of Occupational Health - Occupational Stress Questionnaire    Feeling of Stress : Not at all  Social Connections: Socially Isolated (10/10/2021)   Social Connection and Isolation Panel [NHANES]    Frequency of Communication with  Friends and Family: Twice a week    Frequency of Social Gatherings with Friends and Family: Twice a week    Attends Religious Services: Never    Database administrator or Organizations: No    Attends Banker Meetings: Never    Marital Status: Widowed  Intimate Partner Violence: Unknown (10/10/2021)   Humiliation, Afraid, Rape, and Kick questionnaire    Fear of Current or Ex-Partner: No    Emotionally Abused: No    Physically Abused: No    Sexually Abused: Not on file    ROS Per hpi   Objective   Vitals as reported by the patient: Today's Vitals   02/12/22 0928  Weight: 179 lb (81.2 kg)  Height: 5\' 4"  (1.626 m)    Nelwyn was seen today for medication refill.  Diagnoses and all orders for this visit:  Wheezing -     albuterol (PROVENTIL) (2.5 MG/3ML) 0.083% nebulizer solution; Take 3 mLs (2.5 mg total) by nebulization 2 (two) times daily as needed for wheezing or shortness of breath. -     albuterol (VENTOLIN HFA) 108 (90 Base) MCG/ACT inhaler; Inhale 2 puffs into the lungs every 4 hours as needed for wheezing or shortness of breath -     promethazine (PHENERGAN) 6.25 MG/5ML syrup; Take 5 mLs (6.25 mg total) by mouth daily as needed for nausea or vomiting.  Persistent asthma without complication, unspecified asthma severity -     albuterol (PROVENTIL) (2.5 MG/3ML) 0.083% nebulizer solution; Take 3 mLs (2.5 mg total) by nebulization 2 (two) times daily as needed for wheezing or shortness of breath. -     albuterol (VENTOLIN HFA) 108 (90 Base) MCG/ACT inhaler; Inhale 2 puffs into the lungs every 4 hours as needed for wheezing or shortness of breath -     benzonatate (TESSALON) 100 MG capsule; Take 1-2 capsules (100-200 mg total) by mouth 3 (three) times daily as needed for cough. -     fluticasone (FLOVENT HFA) 44 MCG/ACT inhaler; INHALE 2 PUFFS INTO THE LUNGS TWICE A DAY -     fluticasone (FLONASE) 50 MCG/ACT nasal spray; SPRAY 2 SPRAYS INTO EACH NOSTRIL EVERY  DAY  Gout of right foot, unspecified cause, unspecified chronicity -     allopurinol (ZYLOPRIM) 100 MG tablet; Take 1 tablet (100 mg total)  by mouth daily.  Stress -     ALPRAZolam (XANAX) 0.25 MG tablet; Take 0.5 tablets (0.125 mg total) by mouth at bedtime as needed for sleep. Take once in bed for the night -     citalopram (CELEXA) 10 MG tablet; Take 1 tablet (10 mg total) by mouth daily.  Chronic pain of both knees -     gabapentin (NEURONTIN) 100 MG capsule; Take 1 capsule (100 mg total) by mouth 3 (three) times daily. -     lidocaine (XYLOCAINE) 5 % ointment; Apply 1 Application topically as needed. -     sennosides-docusate sodium (SENOKOT-S) 8.6-50 MG tablet; Take 1 tablet by mouth daily. -     trolamine salicylate (ASPERCREME/ALOE) 10 % cream; Apply 1 Application topically as needed for muscle pain. -     lidocaine (LIDODERM) 5 %; PLACE 1 PATCH ONTO THE SKIN DAILY. REMOVE & DISCARD PATCH WITHIN 12 HOURS OR AS DIRECTED BY MD. PA DENIED  Essential hypertension -     hydrochlorothiazide (HYDRODIURIL) 25 MG tablet; Take 1 tablet (25 mg total) by mouth daily.  Heartburn -     pantoprazole (PROTONIX) 40 MG tablet; Take 1 tablet (40 mg total) by mouth daily.  Chronic idiopathic constipation -     sennosides-docusate sodium (SENOKOT-S) 8.6-50 MG tablet; Take 1 tablet by mouth daily.    PLAN I have implored upon the patient that she needs to present in office for a visit for labs and exam. We cannot provide further fills responsibly without seeing her in person.  She voices understanding. I will provide offices and providers closer to her home to ease the transportation burden. Patient encouraged to call clinic with any questions, comments, or concerns.   I discussed the assessment and treatment plan with the patient. The patient was provided an opportunity to ask questions and all were answered. The patient agreed with the plan and demonstrated an understanding of the  instructions.   The patient was advised to call back or seek an in-person evaluation if the symptoms worsen or if the condition fails to improve as anticipated.  I provided 14 minutes of non-face-to-face time during this encounter.  [name], NP

## 2022-02-24 DIAGNOSIS — N3946 Mixed incontinence: Secondary | ICD-10-CM | POA: Diagnosis not present

## 2022-02-24 DIAGNOSIS — R3915 Urgency of urination: Secondary | ICD-10-CM | POA: Diagnosis not present

## 2022-02-24 DIAGNOSIS — Z741 Need for assistance with personal care: Secondary | ICD-10-CM | POA: Diagnosis not present

## 2022-03-26 DIAGNOSIS — R3915 Urgency of urination: Secondary | ICD-10-CM | POA: Diagnosis not present

## 2022-03-26 DIAGNOSIS — N3946 Mixed incontinence: Secondary | ICD-10-CM | POA: Diagnosis not present

## 2022-03-26 DIAGNOSIS — Z741 Need for assistance with personal care: Secondary | ICD-10-CM | POA: Diagnosis not present

## 2022-04-29 DIAGNOSIS — N3946 Mixed incontinence: Secondary | ICD-10-CM | POA: Diagnosis not present

## 2022-04-29 DIAGNOSIS — R3915 Urgency of urination: Secondary | ICD-10-CM | POA: Diagnosis not present

## 2022-04-29 DIAGNOSIS — Z741 Need for assistance with personal care: Secondary | ICD-10-CM | POA: Diagnosis not present

## 2022-05-28 DIAGNOSIS — R3915 Urgency of urination: Secondary | ICD-10-CM | POA: Diagnosis not present

## 2022-05-28 DIAGNOSIS — Z741 Need for assistance with personal care: Secondary | ICD-10-CM | POA: Diagnosis not present

## 2022-05-28 DIAGNOSIS — N3946 Mixed incontinence: Secondary | ICD-10-CM | POA: Diagnosis not present

## 2022-06-26 DIAGNOSIS — R3915 Urgency of urination: Secondary | ICD-10-CM | POA: Diagnosis not present

## 2022-06-26 DIAGNOSIS — Z741 Need for assistance with personal care: Secondary | ICD-10-CM | POA: Diagnosis not present

## 2022-06-26 DIAGNOSIS — N3946 Mixed incontinence: Secondary | ICD-10-CM | POA: Diagnosis not present

## 2022-07-29 DIAGNOSIS — Z741 Need for assistance with personal care: Secondary | ICD-10-CM | POA: Diagnosis not present

## 2022-07-29 DIAGNOSIS — N3946 Mixed incontinence: Secondary | ICD-10-CM | POA: Diagnosis not present

## 2022-07-29 DIAGNOSIS — R3915 Urgency of urination: Secondary | ICD-10-CM | POA: Diagnosis not present

## 2022-08-14 ENCOUNTER — Ambulatory Visit: Payer: Medicare HMO | Admitting: Internal Medicine

## 2022-08-27 DIAGNOSIS — N3946 Mixed incontinence: Secondary | ICD-10-CM | POA: Diagnosis not present

## 2022-08-27 DIAGNOSIS — Z741 Need for assistance with personal care: Secondary | ICD-10-CM | POA: Diagnosis not present

## 2022-08-27 DIAGNOSIS — R3915 Urgency of urination: Secondary | ICD-10-CM | POA: Diagnosis not present

## 2022-09-25 DIAGNOSIS — Z741 Need for assistance with personal care: Secondary | ICD-10-CM | POA: Diagnosis not present

## 2022-09-25 DIAGNOSIS — R3915 Urgency of urination: Secondary | ICD-10-CM | POA: Diagnosis not present

## 2022-09-25 DIAGNOSIS — N3946 Mixed incontinence: Secondary | ICD-10-CM | POA: Diagnosis not present

## 2022-10-06 ENCOUNTER — Telehealth: Payer: Self-pay | Admitting: Registered Nurse

## 2022-10-06 NOTE — Telephone Encounter (Signed)
Called daughter inform can come get a specimen cup if need. Daughter states if she is over this way she will stop if not will bring UA back if Judson Roch order one on Thursday..Princella Pellegrini

## 2022-10-06 NOTE — Telephone Encounter (Signed)
Patient's daughter called and asked if patient could receive a specimen cup for urinalysis. Patient has a new patient appointment with Jeralyn Ruths on 10/09/22. If she needs a urinalysis, she is concerned about her ability to do it at the doctor's office due to her age. Please give her daughter a call back at [phone] to let her know if that is possible.

## 2022-10-09 ENCOUNTER — Ambulatory Visit: Payer: Medicare HMO | Admitting: Nurse Practitioner

## 2022-10-28 DIAGNOSIS — N3946 Mixed incontinence: Secondary | ICD-10-CM | POA: Diagnosis not present

## 2022-10-28 DIAGNOSIS — R3915 Urgency of urination: Secondary | ICD-10-CM | POA: Diagnosis not present

## 2022-10-28 DIAGNOSIS — Z741 Need for assistance with personal care: Secondary | ICD-10-CM | POA: Diagnosis not present

## 2022-11-26 DIAGNOSIS — N3946 Mixed incontinence: Secondary | ICD-10-CM | POA: Diagnosis not present

## 2022-11-26 DIAGNOSIS — R3915 Urgency of urination: Secondary | ICD-10-CM | POA: Diagnosis not present

## 2022-11-26 DIAGNOSIS — Z741 Need for assistance with personal care: Secondary | ICD-10-CM | POA: Diagnosis not present

## 2022-12-05 ENCOUNTER — Ambulatory Visit (INDEPENDENT_AMBULATORY_CARE_PROVIDER_SITE_OTHER): Payer: Medicare HMO | Admitting: Nurse Practitioner

## 2022-12-05 ENCOUNTER — Other Ambulatory Visit: Payer: Self-pay | Admitting: Nurse Practitioner

## 2022-12-05 VITALS — BP 156/82 | HR 66 | Temp 98.2°F

## 2022-12-05 DIAGNOSIS — M1990 Unspecified osteoarthritis, unspecified site: Secondary | ICD-10-CM

## 2022-12-05 DIAGNOSIS — J45909 Unspecified asthma, uncomplicated: Secondary | ICD-10-CM

## 2022-12-05 DIAGNOSIS — R062 Wheezing: Secondary | ICD-10-CM

## 2022-12-05 DIAGNOSIS — J4520 Mild intermittent asthma, uncomplicated: Secondary | ICD-10-CM | POA: Diagnosis not present

## 2022-12-05 DIAGNOSIS — M109 Gout, unspecified: Secondary | ICD-10-CM

## 2022-12-05 DIAGNOSIS — I10 Essential (primary) hypertension: Secondary | ICD-10-CM

## 2022-12-05 LAB — CBC
HCT: 40.1 % (ref 36.0–46.0)
Hemoglobin: 12.8 g/dL (ref 12.0–15.0)
MCHC: 32 g/dL (ref 30.0–36.0)
MCV: 95.5 fl (ref 78.0–100.0)
Platelets: 217 10*3/uL (ref 150.0–400.0)
RBC: 4.2 Mil/uL (ref 3.87–5.11)
RDW: 14.5 % (ref 11.5–15.5)
WBC: 4.9 10*3/uL (ref 4.0–10.5)

## 2022-12-05 LAB — URIC ACID: Uric Acid, Serum: 7.6 mg/dL — ABNORMAL HIGH (ref 2.4–7.0)

## 2022-12-05 LAB — COMPREHENSIVE METABOLIC PANEL
ALT: 14 U/L (ref 0–35)
AST: 24 U/L (ref 0–37)
Albumin: 4.4 g/dL (ref 3.5–5.2)
Alkaline Phosphatase: 46 U/L (ref 39–117)
BUN: 18 mg/dL (ref 6–23)
CO2: 33 mEq/L — ABNORMAL HIGH (ref 19–32)
Calcium: 9.8 mg/dL (ref 8.4–10.5)
Chloride: 100 mEq/L (ref 96–112)
Creatinine, Ser: 0.93 mg/dL (ref 0.40–1.20)
GFR: 53.89 mL/min — ABNORMAL LOW (ref >60.00)
Glucose, Bld: 79 mg/dL (ref 70–99)
Potassium: 5 mEq/L (ref 3.5–5.1)
Sodium: 139 mEq/L (ref 135–145)
Total Bilirubin: 0.4 mg/dL (ref 0.2–1.2)
Total Protein: 7.8 g/dL (ref 6.0–8.3)

## 2022-12-05 MED ORDER — ARNUITY ELLIPTA 50 MCG/ACT IN AEPB
1.0000 | INHALATION_SPRAY | Freq: Every day | RESPIRATORY_TRACT | 1 refills | Status: DC
Start: 2022-12-05 — End: 2023-07-24

## 2022-12-05 MED ORDER — ALBUTEROL SULFATE HFA 108 (90 BASE) MCG/ACT IN AERS
INHALATION_SPRAY | RESPIRATORY_TRACT | 1 refills | Status: AC
Start: 2022-12-05 — End: ?

## 2022-12-05 MED ORDER — FLUTICASONE PROPIONATE 50 MCG/ACT NA SUSP
1.0000 | Freq: Every day | NASAL | 3 refills | Status: DC
Start: 2022-12-05 — End: 2023-07-24

## 2022-12-05 MED ORDER — FLUTICASONE PROPIONATE HFA 44 MCG/ACT IN AERO
INHALATION_SPRAY | RESPIRATORY_TRACT | 5 refills | Status: DC
Start: 2022-12-05 — End: 2022-12-05

## 2022-12-05 NOTE — Assessment & Plan Note (Signed)
Chronic, is experiencing more wheezing and coughing with the recent season change Continue albuterol as needed.  Recommend use of inhaler when out of the home but to use nebulizer when at home.  Have prescribed nebulizer DME as well as medication sent to her pharmacy. Will also represcribe Flovent HFA 44 mcg per actuation.  Patient to inhale 2 actuations twice a day, educated to swish and spit after use.  May consider discontinuation in the summertime if symptoms improve.

## 2022-12-05 NOTE — Assessment & Plan Note (Addendum)
Chronic, severe. Due to pain and knee instability she uses a wheelchair for majority for ambulation. Order for wheelchair, shower chair provided to patient to bring to medical equipment supply store.

## 2022-12-05 NOTE — Assessment & Plan Note (Signed)
Chronic Patient not on preventative medication currently Check uric acid level, further recommendations may be made based upon his results.

## 2022-12-05 NOTE — Progress Notes (Signed)
New Patient Office Visit  Subjective    Patient ID: Amy Gallagher, female    DOB: 06/22/1930  Age: 89 y.o. MRN: [id number]  CC:  Chief Complaint  Patient presents with   Arthritis    HPI Amy Gallagher presents to establish care Previous provider left the practice so she is establishing here.  She is accompanied today by her grandchildren.  She reports that she has severe osteoarthritis of bilateral knees as well as her hands.  This keeps her essentially wheelchair-bound.  She also needs assistance with dressing and bathing.  She is able to feed herself.  She is requesting evaluation to see if she would qualify for home health assistance mainly with home health aide.  She reports that she needs a new wheelchair as her current wheelchair is too wide for her doors and makes it challenging to navigate her home.    She has a history of asthma, and reports that asthma worsens with season changes.  She is out of her albuterol inhaler, does not have a nebulizer machine or nebulized albuterol, she also has been on Flovent inhaler for maintenance.  Reports that she has been waking up frequently at night due to coughing in the recent past.  She has hypertension and she is prescribed hydrochlorothiazide for this.  She admits to not taking her medication daily as prescribed but takes it a few times a week.  Denies any chest pain.  She has a history of gout, is not currently on her allopurinol or colchicine which she used to take for prevention.  She does routinely take multivitamin.  She also uses Flonase nasal spray for allergic rhinitis on a regular basis and would like a refill of this today.  Outpatient Encounter Medications as of 12/05/2022  Medication Sig   albuterol (PROVENTIL) (2.5 MG/3ML) 0.083% nebulizer solution Take 3 mLs (2.5 mg total) by nebulization 2 (two) times daily as needed for wheezing or shortness of breath.   [employer] VITAMIN C 500 MG tablet TAKE 1 TABLET BY MOUTH EVERY DAY    Dextromethorphan HBr 10 MG/15ML SYRP Take 15 mLs (10 mg total) by mouth daily.   hydrochlorothiazide (HYDRODIURIL) 25 MG tablet Take 1 tablet (25 mg total) by mouth daily.   Misc. Devices (COMMODE) MISC Please dispense one rail for commode. ICD10 Z74.09   Misc. Devices [facility]) MISC 1 Device by Does not apply route daily.   Multiple Vitamin (MULTIVITAMIN WITH MINERALS) TABS tablet Take 1 tablet by mouth daily.   [DISCONTINUED] albuterol (VENTOLIN HFA) 108 (90 Base) MCG/ACT inhaler Inhale 2 puffs into the lungs every 4 hours as needed for wheezing or shortness of breath   [DISCONTINUED] allopurinol (ZYLOPRIM) 100 MG tablet Take 1 tablet (100 mg total) by mouth daily.   [DISCONTINUED] benzonatate (TESSALON) 100 MG capsule Take 1-2 capsules (100-200 mg total) by mouth 3 (three) times daily as needed for cough.   [DISCONTINUED] citalopram (CELEXA) 10 MG tablet Take 1 tablet (10 mg total) by mouth daily.   [DISCONTINUED] fluticasone (FLONASE) 50 MCG/ACT nasal spray SPRAY 2 SPRAYS INTO EACH NOSTRIL EVERY DAY   [DISCONTINUED] fluticasone (FLOVENT HFA) 44 MCG/ACT inhaler INHALE 2 PUFFS INTO THE LUNGS TWICE A DAY   [DISCONTINUED] lidocaine (LIDODERM) 5 % PLACE 1 PATCH ONTO THE SKIN DAILY. REMOVE & DISCARD PATCH WITHIN 12 HOURS OR AS DIRECTED BY MD. PA DENIED   [DISCONTINUED] lidocaine (XYLOCAINE) 5 % ointment Apply 1 Application topically as needed.   [DISCONTINUED] methocarbamol (ROBAXIN) 500 MG tablet  TAKE 0.5 TABLETS (250 MG TOTAL) BY MOUTH 2 (TWO) TIMES DAILY AS NEEDED FOR MUSCLE SPASMS.   [DISCONTINUED] pantoprazole (PROTONIX) 40 MG tablet Take 1 tablet (40 mg total) by mouth daily.   [DISCONTINUED] sennosides-docusate sodium (SENOKOT-S) 8.6-50 MG tablet Take 1 tablet by mouth daily.   [DISCONTINUED] trolamine salicylate (ASPERCREME/ALOE) 10 % cream Apply 1 Application topically as needed for muscle pain.   albuterol (VENTOLIN HFA) 108 (90 Base) MCG/ACT inhaler Inhale 2 puffs into the lungs every  4 hours as needed for wheezing or shortness of breath   fluticasone (FLONASE) 50 MCG/ACT nasal spray Place 1 spray into both nostrils daily. SPRAY 2 SPRAYS INTO EACH NOSTRIL EVERY DAY   fluticasone (FLOVENT HFA) 44 MCG/ACT inhaler INHALE 2 PUFFS INTO THE LUNGS TWICE A DAY   [DISCONTINUED] ALPRAZolam (XANAX) 0.25 MG tablet Take 0.5 tablets (0.125 mg total) by mouth at bedtime as needed for sleep. Take once in bed for the night (Patient not taking: Reported on 12/05/2022)   [DISCONTINUED] Colchicine (MITIGARE) 0.6 MG CAPS Take 0.6 mg by mouth daily. Dispense as MITIGARE (Patient not taking: Reported on 12/05/2022)   [DISCONTINUED] gabapentin (NEURONTIN) 100 MG capsule Take 1 capsule (100 mg total) by mouth 3 (three) times daily. (Patient not taking: Reported on 12/05/2022)   [DISCONTINUED] promethazine (PHENERGAN) 6.25 MG/5ML syrup Take 5 mLs (6.25 mg total) by mouth daily as needed for nausea or vomiting.   No facility-administered encounter medications on file as of 12/05/2022.    Past Medical History:  Diagnosis Date   Arthritis    Bronchitis    Gout     Past Surgical History:  Procedure Laterality Date   SALPINGECTOMY     right    No family history on file.  Social History   Socioeconomic History   Marital status: Widowed    Spouse name: Not on file   Number of children: Not on file   Years of education: Not on file   Highest education level: Not on file  Occupational History   Not on file  Tobacco Use   Smoking status: Former    Types: Cigarettes    Quit date: 02/26/1997    Years since quitting: 25.7   Smokeless tobacco: Never  Substance and Sexual Activity   Alcohol use: No   Drug use: No   Sexual activity: Not on file  Other Topics Concern   Not on file  Social History Narrative   Patient cares for her mentally 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    Walks with a rolling walker.    Social Determinants of Health   Financial  Resource Strain: Low Risk  (10/10/2021)   Overall Financial Resource Strain (CARDIA)    Difficulty of Paying Living Expenses: Not hard at all  Food Insecurity: No Food Insecurity (10/10/2021)   Hunger Vital Sign    Worried About Running Out of Food in the Last Year: Never true    Ran Out of Food in the Last Year: Never true  Transportation Needs: No Transportation Needs (10/10/2021)   PRAPARE - Administrator, Civil Service (Medical): No    Lack of Transportation (Non-Medical): No  Physical Activity: Inactive (10/10/2021)   Exercise Vital Sign    Days of Exercise per Week: 0 days    Minutes of Exercise per Session: 0 min  Stress: No Stress Concern Present (10/10/2021)   [employer] of Occupational Health - Occupational Stress Questionnaire  Feeling of Stress : Not at all  Social Connections: Socially Isolated (10/10/2021)   Social Connection and Isolation Panel [NHANES]    Frequency of Communication with Friends and Family: Twice a week    Frequency of Social Gatherings with Friends and Family: Twice a week    Attends Religious Services: Never    Database administrator or Organizations: No    Attends Banker Meetings: Never    Marital Status: Widowed  Intimate Partner Violence: Unknown (10/10/2021)   Humiliation, Afraid, Rape, and Kick questionnaire    Fear of Current or Ex-Partner: No    Emotionally Abused: No    Physically Abused: No    Sexually Abused: Not on file    Review of Systems  Eyes:  Negative for blurred vision and double vision.  Respiratory:  Positive for cough. Negative for shortness of breath.   Cardiovascular:  Negative for chest pain.  Musculoskeletal:  Positive for joint pain.  Neurological:  Negative for headaches.        Objective    BP (!) 156/82   Pulse 66   Temp 98.2 F (36.8 C)   Physical Exam Vitals reviewed.  Constitutional:      General: She is not in acute distress.    Appearance: Normal appearance.  HENT:      Head: Normocephalic and atraumatic.  Neck:     Vascular: No carotid bruit.  Cardiovascular:     Rate and Rhythm: Normal rate and regular rhythm.     Pulses: Normal pulses.     Heart sounds: Normal heart sounds.  Pulmonary:     Effort: Pulmonary effort is normal.     Breath sounds: Normal breath sounds. No wheezing.  Skin:    General: Skin is warm and dry.  Neurological:     General: No focal deficit present.     Mental Status: She is alert and oriented to person, place, and time.  Psychiatric:        Mood and Affect: Mood normal.        Behavior: Behavior normal.        Judgment: Judgment normal.         Assessment & Plan:   Problem List Items Addressed This Visit       Cardiovascular and Mediastinum   Essential hypertension - Primary    Chronic, above goal, asymptomatic at this time Currently prescribed hydrochlorothiazide patient also has history of gout and is not taking hydrochlorothiazide on a regular basis.   May consider changing to a different agent at next office visit.  Follow-up in 1 month for close monitoring.      Relevant Orders   CBC (Completed)   Comprehensive metabolic panel (Completed)     Respiratory   Mild intermittent asthma    Chronic, is experiencing more wheezing and coughing with the recent season change Continue albuterol as needed.  Recommend use of inhaler when out of the home but to use nebulizer when at home.  Have prescribed nebulizer DME as well as medication sent to her pharmacy. Will also represcribe Flovent HFA 44 mcg per actuation.  Patient to inhale 2 actuations twice a day, educated to swish and spit after use.  May consider discontinuation in the summertime if symptoms improve.      Relevant Medications   albuterol (VENTOLIN HFA) 108 (90 Base) MCG/ACT inhaler   fluticasone (FLOVENT HFA) 44 MCG/ACT inhaler   Other Relevant Orders   For home use only DME Nebulizer machine  RESOLVED: Persistent asthma without complication    Relevant Medications   fluticasone (FLONASE) 50 MCG/ACT nasal spray   albuterol (VENTOLIN HFA) 108 (90 Base) MCG/ACT inhaler   fluticasone (FLOVENT HFA) 44 MCG/ACT inhaler     Musculoskeletal and Integument   Arthritis    Chronic, severe. Due to pain and knee instability she uses a wheelchair for majority for ambulation. Order for wheelchair, shower chair provided to patient to bring to medical equipment supply store.       Relevant Orders   For home use only DME Other see comment   For home use only DME Other see comment   Ambulatory referral to Home Health     Other   Gout    Chronic Patient not on preventative medication currently Check uric acid level, further recommendations may be made based upon his results.      Relevant Orders   Uric acid (Completed)   RESOLVED: Wheezing   Relevant Medications   albuterol (VENTOLIN HFA) 108 (90 Base) MCG/ACT inhaler    Return in about 1 month (around 01/05/2023) for F/U with Mazzie Brodrick.  Total time spent on encounter today was 45 minutes including face-to-face evaluation, review of previous records, and development/discussion of treatment plan.  Elenore Paddy, NP

## 2022-12-05 NOTE — Telephone Encounter (Signed)
Pharmacy comment: Alternative Requested:THE PRESCRIBED MEDICATION IS NOT COVERED BY INSURANCE. PLEASE CONSIDER CHANGING TO ONE OF THE SUGGESTED COVERED ALTERNATIVES.

## 2022-12-05 NOTE — Assessment & Plan Note (Signed)
Chronic, above goal, asymptomatic at this time Currently prescribed hydrochlorothiazide patient also has history of gout and is not taking hydrochlorothiazide on a regular basis.   May consider changing to a different agent at next office visit.  Follow-up in 1 month for close monitoring.

## 2022-12-10 ENCOUNTER — Telehealth: Payer: Self-pay

## 2022-12-10 ENCOUNTER — Telehealth: Payer: Self-pay | Admitting: Nurse Practitioner

## 2022-12-10 NOTE — Telephone Encounter (Signed)
Pt daughter called having some questions about how would she go  bout getting Home Health set for the pt. Please advise.

## 2022-12-10 NOTE — Telephone Encounter (Signed)
Pt Medical supply signed and faxed, result ok

## 2022-12-11 NOTE — Telephone Encounter (Signed)
Called pt inform home health order was order and per pt chart [facility] should be getting in  contact with her for assessment...Raechel Chute

## 2022-12-17 ENCOUNTER — Ambulatory Visit (INDEPENDENT_AMBULATORY_CARE_PROVIDER_SITE_OTHER): Payer: Medicare HMO

## 2022-12-17 DIAGNOSIS — Z0000 Encounter for general adult medical examination without abnormal findings: Secondary | ICD-10-CM | POA: Diagnosis not present

## 2022-12-17 NOTE — Progress Notes (Addendum)
I connected with  Amy Gallagher on 12/17/22 by a audio enabled telemedicine application and verified that I am speaking with the correct person using two identifiers.  Patient Location: Home  Provider Location: Office/Clinic  I discussed the limitations of evaluation and management by telemedicine. The patient expressed understanding and agreed to proceed.  Subjective:   Amy Gallagher is a 89 y.o. female who presents for Medicare Annual (Subsequent) preventive examination.  Review of Systems     Cardiac Risk Factors include: advanced age (>18men, >94 women);sedentary lifestyle     Objective:    Today's Vitals   12/17/22 1133  PainSc: 0-No pain   There is no height or weight on file to calculate BMI.     12/17/2022   11:36 AM 10/10/2021   12:51 PM 06/24/2019    2:10 PM  Advanced Directives  Does Patient Have a Medical Advance Directive? No No Yes  Type of [place] boy of [place];Living will  Would patient like information on creating a medical advance directive? No - Patient declined No - Patient declined     Current Medications (verified) Outpatient Encounter Medications as of 12/17/2022  Medication Sig   albuterol (PROVENTIL) (2.5 MG/3ML) 0.083% nebulizer solution Take 3 mLs (2.5 mg total) by nebulization 2 (two) times daily as needed for wheezing or shortness of breath.   albuterol (VENTOLIN HFA) 108 (90 Base) MCG/ACT inhaler Inhale 2 puffs into the lungs every 4 hours as needed for wheezing or shortness of breath   [facility] VITAMIN C 500 MG tablet TAKE 1 TABLET BY MOUTH EVERY DAY   Dextromethorphan HBr 10 MG/15ML SYRP Take 15 mLs (10 mg total) by mouth daily.   fluticasone (FLONASE) 50 MCG/ACT nasal spray Place 1 spray into both nostrils daily. SPRAY 2 SPRAYS INTO EACH NOSTRIL EVERY DAY   Fluticasone Furoate (ARNUITY ELLIPTA) 50 MCG/ACT AEPB Inhale 1 puff into the lungs daily.   hydrochlorothiazide (HYDRODIURIL) 25 MG tablet Take 1 tablet (25 mg  total) by mouth daily.   Misc. Devices (COMMODE) MISC Please dispense one rail for commode. ICD10 Z74.09   Misc. Devices [facility]) MISC 1 Device by Does not apply route daily.   Multiple Vitamin (MULTIVITAMIN WITH MINERALS) TABS tablet Take 1 tablet by mouth daily.   No facility-administered encounter medications on file as of 12/17/2022.    Allergies (verified) Aspirin and Penicillins   History: Past Medical History:  Diagnosis Date   Arthritis    Bronchitis    Gout    Past Surgical History:  Procedure Laterality Date   SALPINGECTOMY     right   History reviewed. No pertinent family history. Social History   Socioeconomic History   Marital status: Widowed    Spouse name: Not on file   Number of children: Not on file   Years of education: Not on file   Highest education level: Not on file  Occupational History   Not on file  Tobacco Use   Smoking status: Former    Types: Cigarettes    Quit date: 02/26/1997    Years since quitting: 25.8   Smokeless tobacco: Never  Substance and Sexual Activity   Alcohol use: No   Drug use: No   Sexual activity: Not on file  Other Topics Concern   Not on file  Social History Narrative   Patient cares for her mentally 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    Walks with a rolling  walker.    Social Determinants of Health   Financial Resource Strain: Low Risk  (12/17/2022)   Overall Financial Resource Strain (CARDIA)    Difficulty of Paying Living Expenses: Not hard at all  Food Insecurity: No Food Insecurity (12/17/2022)   Hunger Vital Sign    Worried About Running Out of Food in the Last Year: Never true    Ran Out of Food in the Last Year: Never true  Transportation Needs: No Transportation Needs (12/17/2022)   PRAPARE - Administrator, Civil Service (Medical): No    Lack of Transportation (Non-Medical): No  Physical Activity: Inactive (12/17/2022)   Exercise Vital Sign    Days of  Exercise per Week: 0 days    Minutes of Exercise per Session: 0 min  Stress: No Stress Concern Present (12/17/2022)   [employer] of Occupational Health - Occupational Stress Questionnaire    Feeling of Stress : Not at all  Social Connections: Socially Isolated (12/17/2022)   Social Connection and Isolation Panel [NHANES]    Frequency of Communication with Friends and Family: Twice a week    Frequency of Social Gatherings with Friends and Family: Twice a week    Attends Religious Services: Never    Database administrator or Organizations: No    Attends Banker Meetings: Never    Marital Status: Widowed    Tobacco Counseling Counseling given: Not Answered   Clinical Intake:  Pre-visit preparation completed: Yes  Pain : No/denies pain Pain Score: 0-No pain     Nutritional Risks: None Diabetes: No  How often do you need to have someone help you when you read instructions, pamphlets, or other written materials from your doctor or pharmacy?: 1 - Never What is the last grade level you completed in school?: N/A  Diabetic? No  Interpreter Needed?: No  Information entered by :: Walda Hertzog N. Calliope Delangel, LPN.   Activities of Daily Living    12/17/2022   11:37 AM  In your present state of health, do you have any difficulty performing the following activities:  Hearing? 0  Vision? 0  Difficulty concentrating or making decisions? 0  Walking or climbing stairs? 1  Dressing or bathing? 1  Doing errands, shopping? 1  Preparing Food and eating ? Y  Using the Toilet? Y  In the past six months, have you accidently leaked urine? Y  Do you have problems with loss of bowel control? Y  Managing your Medications? Y  Managing your Finances? N  Housekeeping or managing your Housekeeping? Y    Patient Care Team: Elenore Paddy, NP as PCP - General (Nurse Practitioner)  Indicate any recent Medical Services you may have received from other than [facility] providers in the past  year (date may be approximate).     Assessment:   This is a routine wellness examination for Amy Gallagher.  Hearing/Vision screen Hearing Screening - Comments:: Denies hearing difficulties   Vision Screening - Comments:: Wears rx glasses - up to date with routine eye exams with [facility]   Dietary issues and exercise activities discussed: Current Exercise Habits: The patient does not participate in regular exercise at present, Exercise limited by: respiratory conditions(s);orthopedic condition(s);Other - see comments (confined to w/c)   Goals Addressed             This Visit's Progress    Client understands the importance of follow-up with providers by attending scheduled visits.        Depression Screen  12/17/2022   11:41 AM 12/05/2022    2:54 PM 10/10/2021   12:52 PM 10/10/2021   12:49 PM 08/21/2021   10:19 AM 06/27/2021    7:51 AM 05/14/2021    1:45 PM  PHQ 2/9 Scores  PHQ - 2 Score 0 0 0 0 0 0 0  PHQ- 9 Score 0    0  0    Fall Risk    12/17/2022   11:43 AM 12/05/2022    2:54 PM 02/12/2022    9:30 AM 10/10/2021   12:52 PM 08/21/2021   10:20 AM  Fall Risk   Falls in the past year? 0 0 0 0 0  Number falls in past yr: 0 0 0 0 0  Injury with Fall? 0 0 0 0 0  Risk for fall due to : History of fall(s);Impaired balance/gait;Impaired mobility;Orthopedic patient History of fall(s) No Fall Risks  No Fall Risks  Follow up Falls prevention discussed Falls evaluation completed Falls evaluation completed Falls evaluation completed Falls evaluation completed  Comment    wheel chair     FALL RISK PREVENTION PERTAINING TO THE HOME:  Any stairs in or around the home? No  If so, are there any without handrails? No  Home free of loose throw rugs in walkways, pet beds, electrical cords, etc? Yes  Adequate lighting in your home to reduce risk of falls? Yes   ASSISTIVE DEVICES UTILIZED TO PREVENT FALLS:  Life alert? No  Use of a cane, walker or w/c? Yes  Grab bars in the bathroom?  No  Shower chair or bench in shower? No  Elevated toilet seat or a handicapped toilet? No   TIMED UP AND GO:  Was the test performed? No . Telephonic Visit  Cognitive Function:        12/17/2022   11:45 AM  6CIT Screen  What Year? 0 points  What month? 0 points  What time? 0 points  Count back from 20 0 points  Months in reverse 0 points  Repeat phrase 0 points  Total Score 0 points    Immunizations Immunization History  Administered Date(s) Administered   Influenza, High Dose Seasonal PF 03/23/2018   Influenza,inj,Quad PF,6+ Mos 06/10/2016, 04/27/2017   Moderna SARS-COV2 Booster Vaccination 08/22/2020, 01/21/2021   Moderna Sars-Covid-2 Vaccination 01/25/2020, 02/22/2020   Pneumococcal Conjugate-13 11/08/2015   Pneumococcal Polysaccharide-23 02/12/1999   Td 05/18/2003    TDAP status: Due, Education has been provided regarding the importance of this vaccine. Advised may receive this vaccine at local pharmacy or Health Dept. Aware to provide a copy of the vaccination record if obtained from local pharmacy or Health Dept. Verbalized acceptance and understanding.  Flu Vaccine status: Due, Education has been provided regarding the importance of this vaccine. Advised may receive this vaccine at local pharmacy or Health Dept. Aware to provide a copy of the vaccination record if obtained from local pharmacy or Health Dept. Verbalized acceptance and understanding.  Pneumococcal vaccine status: Up to date  Covid-19 vaccine status: Completed vaccines  Qualifies for Shingles Vaccine? Yes   Zostavax completed No   Shingrix Completed?: No.    Education has been provided regarding the importance of this vaccine. Patient has been advised to call insurance company to determine out of pocket expense if they have not yet received this vaccine. Advised may also receive vaccine at local pharmacy or Health Dept. Verbalized acceptance and understanding.  Screening Tests Health Maintenance   Topic Date Due   Zoster Vaccines- Shingrix (1  of 2) Never done   DTaP/Tdap/Td (2 - Tdap) 05/17/2013   COVID-19 Vaccine (5 - 2023-24 season) 03/14/2022   DEXA SCAN  02/13/2023 (Originally 10/31/1996)   INFLUENZA VACCINE  02/12/2023   Medicare Annual Wellness (AWV)  12/17/2023   Pneumonia Vaccine 32+ Years old  Completed   HPV VACCINES  Aged Out    Health Maintenance  Health Maintenance Due  Topic Date Due   Zoster Vaccines- Shingrix (1 of 2) Never done   DTaP/Tdap/Td (2 - Tdap) 05/17/2013   COVID-19 Vaccine (5 - 2023-24 season) 03/14/2022    Colorectal cancer screening: No longer required.   Mammogram status: No longer required due to age.  Bone Density status: Never done.  Lung Cancer Screening: (Low Dose CT Chest recommended if Age 75-80 years, 30 pack-year currently smoking OR have quit w/in 15years.) does not qualify.   Lung Cancer Screening Referral: no  Additional Screening:  Hepatitis C Screening: does not qualify; Completed: no  Vision Screening: Recommended annual ophthalmology exams for early detection of glaucoma and other disorders of the eye. Is the patient up to date with their annual eye exam?  Yes  Who is the provider or what is the name of the office in which the patient attends annual eye exams? [facility] If pt is not established with a provider, would they like to be referred to a provider to establish care? No .   Dental Screening: Recommended annual dental exams for proper oral hygiene  Community Resource Referral / Chronic Care Management: CRR required this visit?  No   CCM required this visit?  No      Plan:     I have personally reviewed and noted the following in the patient's chart:   Medical and social history Use of alcohol, tobacco or illicit drugs  Current medications and supplements including opioid prescriptions. Patient is not currently taking opioid prescriptions. Functional ability and status Nutritional  status Physical activity Advanced directives List of other physicians Hospitalizations, surgeries, and ER visits in previous 12 months Vitals Screenings to include cognitive, depression, and falls Referrals and appointments  In addition, I have reviewed and discussed with patient certain preventive protocols, quality metrics, and best practice recommendations. A written personalized care plan for preventive services as well as general preventive health recommendations were provided to patient.     Mickeal Needy, LPN   07/19/1094   Nurse Notes: Normal cognitive status assessed by direct observation via telephone conversation by this Nurse Health Advisor. No abnormalities found. Granddaughter also assisted with today's call.   Medical screening examination/treatment/procedure(s) were performed by non-physician practitioner and as supervising physician I was immediately available for consultation/collaboration.  I agree with above. Jacinta Shoe, MD

## 2022-12-17 NOTE — Patient Instructions (Signed)
Amy Gallagher , Thank you for taking time to come for your Medicare Wellness Visit. I appreciate your ongoing commitment to your health goals. Please review the following plan we discussed and let me know if I can assist you in the future.   These are the goals we discussed:  Goals      Client understands the importance of follow-up with providers by attending scheduled visits.        This is a list of the screening recommended for you and due dates:  Health Maintenance  Topic Date Due   Zoster (Shingles) Vaccine (1 of 2) Never done   DTaP/Tdap/Td vaccine (2 - Tdap) 05/17/2013   COVID-19 Vaccine (5 - 2023-24 season) 03/14/2022   DEXA scan (bone density measurement)  02/13/2023*   Flu Shot  02/12/2023   Medicare Annual Wellness Visit  12/17/2023   Pneumonia Vaccine  Completed   HPV Vaccine  Aged Out  *Topic was postponed. The date shown is not the original due date.    Advanced directives: No  Conditions/risks identified: Yes  Next appointment: Follow up in one year for your annual wellness visit.   Preventive Care 29 Years and Older, Female Preventive care refers to lifestyle choices and visits with your health care provider that can promote health and wellness. What does preventive care include? A yearly physical exam. This is also called an annual well check. Dental exams once or twice a year. Routine eye exams. Ask your health care provider how often you should have your eyes checked. Personal lifestyle choices, including: Daily care of your teeth and gums. Regular physical activity. Eating a healthy diet. Avoiding tobacco and drug use. Limiting alcohol use. Practicing safe sex. Taking low-dose aspirin every day. Taking vitamin and mineral supplements as recommended by your health care provider. What happens during an annual well check? The services and screenings done by your health care provider during your annual well check will depend on your age, overall health,  lifestyle risk factors, and family history of disease. Counseling  Your health care provider may ask you questions about your: Alcohol use. Tobacco use. Drug use. Emotional well-being. Home and relationship well-being. Sexual activity. Eating habits. History of falls. Memory and ability to understand (cognition). Work and work Astronomer. Reproductive health. Screening  You may have the following tests or measurements: Height, weight, and BMI. Blood pressure. Lipid and cholesterol levels. These may be checked every 5 years, or more frequently if you are over 52 years old. Skin check. Lung cancer screening. You may have this screening every year starting at age 4 if you have a 30-pack-year history of smoking and currently smoke or have quit within the past 15 years. Fecal occult blood test (FOBT) of the stool. You may have this test every year starting at age 72. Flexible sigmoidoscopy or colonoscopy. You may have a sigmoidoscopy every 5 years or a colonoscopy every 10 years starting at age 76. Hepatitis C blood test. Hepatitis B blood test. Sexually transmitted disease (STD) testing. Diabetes screening. This is done by checking your blood sugar (glucose) after you have not eaten for a while (fasting). You may have this done every 1-3 years. Bone density scan. This is done to screen for osteoporosis. You may have this done starting at age 27. Mammogram. This may be done every 1-2 years. Talk to your health care provider about how often you should have regular mammograms. Talk with your health care provider about your test results, treatment options, and if  necessary, the need for more tests. Vaccines  Your health care provider may recommend certain vaccines, such as: Influenza vaccine. This is recommended every year. Tetanus, diphtheria, and acellular pertussis (Tdap, Td) vaccine. You may need a Td booster every 10 years. Zoster vaccine. You may need this after age  97. Pneumococcal 13-valent conjugate (PCV13) vaccine. One dose is recommended after age 88. Pneumococcal polysaccharide (PPSV23) vaccine. One dose is recommended after age 20. Talk to your health care provider about which screenings and vaccines you need and how often you need them. This information is not intended to replace advice given to you by your health care provider. Make sure you discuss any questions you have with your health care provider. Document Released: 07/27/2015 Document Revised: 03/19/2016 Document Reviewed: 05/01/2015 Elsevier Interactive Patient Education  2017 [employer].  Fall Prevention in the Home Falls can cause injuries. They can happen to people of all ages. There are many things you can do to make your home safe and to help prevent falls. What can I do on the outside of my home? Regularly fix the edges of walkways and driveways and fix any cracks. Remove anything that might make you trip as you walk through a door, such as a raised step or threshold. Trim any bushes or trees on the path to your home. Use bright outdoor lighting. Clear any walking paths of anything that might make someone trip, such as rocks or tools. Regularly check to see if handrails are loose or broken. Make sure that both sides of any steps have handrails. Any raised decks and porches should have guardrails on the edges. Have any leaves, snow, or ice cleared regularly. Use sand or salt on walking paths during winter. Clean up any spills in your garage right away. This includes oil or grease spills. What can I do in the bathroom? Use night lights. Install grab bars by the toilet and in the tub and shower. Do not use towel bars as grab bars. Use non-skid mats or decals in the tub or shower. If you need to sit down in the shower, use a plastic, non-slip stool. Keep the floor dry. Clean up any water that spills on the floor as soon as it happens. Remove soap buildup in the tub or shower  regularly. Attach bath mats securely with double-sided non-slip rug tape. Do not have throw rugs and other things on the floor that can make you trip. What can I do in the bedroom? Use night lights. Make sure that you have a light by your bed that is easy to reach. Do not use any sheets or blankets that are too big for your bed. They should not hang down onto the floor. Have a firm chair that has side arms. You can use this for support while you get dressed. Do not have throw rugs and other things on the floor that can make you trip. What can I do in the kitchen? Clean up any spills right away. Avoid walking on wet floors. Keep items that you use a lot in easy-to-reach places. If you need to reach something above you, use a strong step stool that has a grab bar. Keep electrical cords out of the way. Do not use floor polish or wax that makes floors slippery. If you must use wax, use non-skid floor wax. Do not have throw rugs and other things on the floor that can make you trip. What can I do with my stairs? Do not leave any items  on the stairs. Make sure that there are handrails on both sides of the stairs and use them. Fix handrails that are broken or loose. Make sure that handrails are as long as the stairways. Check any carpeting to make sure that it is firmly attached to the stairs. Fix any carpet that is loose or worn. Avoid having throw rugs at the top or bottom of the stairs. If you do have throw rugs, attach them to the floor with carpet tape. Make sure that you have a light switch at the top of the stairs and the bottom of the stairs. If you do not have them, ask someone to add them for you. What else can I do to help prevent falls? Wear shoes that: Do not have high heels. Have rubber bottoms. Are comfortable and fit you well. Are closed at the toe. Do not wear sandals. If you use a stepladder: Make sure that it is fully opened. Do not climb a closed stepladder. Make sure that  both sides of the stepladder are locked into place. Ask someone to hold it for you, if possible. Clearly mark and make sure that you can see: Any grab bars or handrails. First and last steps. Where the edge of each step is. Use tools that help you move around (mobility aids) if they are needed. These include: Canes. Walkers. Scooters. Crutches. Turn on the lights when you go into a dark area. Replace any light bulbs as soon as they burn out. Set up your furniture so you have a clear path. Avoid moving your furniture around. If any of your floors are uneven, fix them. If there are any pets around you, be aware of where they are. Review your medicines with your doctor. Some medicines can make you feel dizzy. This can increase your chance of falling. Ask your doctor what other things that you can do to help prevent falls. This information is not intended to replace advice given to you by your health care provider. Make sure you discuss any questions you have with your health care provider. Document Released: 04/26/2009 Document Revised: 12/06/2015 Document Reviewed: 08/04/2014 Elsevier Interactive Patient Education  2017 [employer].

## 2022-12-18 ENCOUNTER — Other Ambulatory Visit: Payer: Self-pay

## 2022-12-18 NOTE — Telephone Encounter (Signed)
[place] medical request office notes, office notes faxed, result ok

## 2022-12-26 DIAGNOSIS — Z7409 Other reduced mobility: Secondary | ICD-10-CM | POA: Diagnosis not present

## 2022-12-26 DIAGNOSIS — Z741 Need for assistance with personal care: Secondary | ICD-10-CM | POA: Diagnosis not present

## 2022-12-26 DIAGNOSIS — R3915 Urgency of urination: Secondary | ICD-10-CM | POA: Diagnosis not present

## 2022-12-31 ENCOUNTER — Telehealth: Payer: Self-pay | Admitting: Nurse Practitioner

## 2022-12-31 DIAGNOSIS — F439 Reaction to severe stress, unspecified: Secondary | ICD-10-CM

## 2022-12-31 DIAGNOSIS — I10 Essential (primary) hypertension: Secondary | ICD-10-CM

## 2022-12-31 NOTE — Telephone Encounter (Signed)
Prescription Request  12/31/2022  LOV: 12/05/2022  What is the name of the medication or equipment? hydrochlorothiazide (HYDRODIURIL) 25 MG tablet  Pantopazole don't see it on her med list but she said she been taking it.  Have you contacted your pharmacy to request a refill? No   Which pharmacy would you like this sent to?  [id number] [name], [place] - [address]  [address] Phone: [phone] Fax: [phone]    Patient notified that their request is being sent to the clinical staff for review and that they should receive a response within 2 business days.   Please advise at Mobile [phone] (mobile)

## 2022-12-31 NOTE — Telephone Encounter (Signed)
Pt called wanting to know if she can seek someone or some type of help or therapy for her home her kids and grandchild which everyone is grown and her kids goes to this practice as well. Kids have special needs can some speak to pt about this situation not sure what's all going on. Please advise

## 2023-01-01 MED ORDER — HYDROCHLOROTHIAZIDE 25 MG PO TABS
25.0000 mg | ORAL_TABLET | Freq: Every day | ORAL | 0 refills | Status: DC
Start: 2023-01-01 — End: 2023-01-19

## 2023-01-01 MED ORDER — PANTOPRAZOLE SODIUM 40 MG PO TBEC: 40.0000 mg | DELAYED_RELEASE_TABLET | Freq: Every day | ORAL | 0 refills | Status: DC

## 2023-01-01 NOTE — Telephone Encounter (Signed)
Order to social work to assist with evaluating the patient's needs

## 2023-01-01 NOTE — Telephone Encounter (Signed)
30-day supply of hydrochlorothiazide as well as pantoprazole was all sent to patient's pharmacy.  I will discuss this further when patient follows up with me in a few days as I feel she may benefit from change in antihypertensive and we will determine if she needs additional evaluation regarding symptoms she is treating with the pantoprazole.

## 2023-01-05 ENCOUNTER — Telehealth: Payer: Self-pay | Admitting: *Deleted

## 2023-01-05 NOTE — Progress Notes (Signed)
  Care Coordination   Note   01/05/2023 Name: Amy Gallagher MRN: [id number] DOB: 1930/07/02  Amy Gallagher is a 89 y.o. year old female who sees Elenore Paddy, NP for primary care. I reached out to Gus Height by phone today to offer care coordination services.  Ms. Dobosz was given information about Care Coordination services today including:   The Care Coordination services include support from the care team which includes your Nurse Coordinator, Clinical Social Worker, or Pharmacist.  The Care Coordination team is here to help remove barriers to the health concerns and goals most important to you. Care Coordination services are voluntary, and the patient may decline or stop services at any time by request to their care team member.   Care Coordination Consent Status: Patient agreed to services and verbal consent obtained.   Follow up plan:  Telephone appointment with care coordination team member scheduled for:  01/13/2023  Encounter Outcome:  Pt. Scheduled from referral   Burman Nieves, [facility] Care Coordination Care Guide Direct Dial: [phone]

## 2023-01-08 ENCOUNTER — Ambulatory Visit: Payer: Medicare HMO | Admitting: Nurse Practitioner

## 2023-01-13 ENCOUNTER — Encounter: Payer: Self-pay | Admitting: Licensed Clinical Social Worker

## 2023-01-13 ENCOUNTER — Telehealth: Payer: Self-pay | Admitting: Licensed Clinical Social Worker

## 2023-01-13 NOTE — Patient Instructions (Signed)
  I am sorry you were unable to keep your phone appointment today.   Please call me to reschedule  Averill Winters, LCSW Social Work Care Coordination  [phone]  

## 2023-01-13 NOTE — Patient Outreach (Signed)
  Care Coordination   01/13/2023 Name: Amy Gallagher MRN: [id number] DOB: Nov 12, 1929   Care Coordination Outreach Attempts:  An unsuccessful telephone outreach was attempted for a scheduled appointment today.  Follow Up Plan:  Additional outreach attempts will be made to offer the patient care coordination information and services.   Will sent to care guide to reschedule if no return call is received.   Encounter Outcome:  No Answer   Care Coordination Interventions:  No, not indicated    Sammuel Hines, LCSW Social Work Care Coordination  [facility] [employer] [employer] [phone]

## 2023-01-17 ENCOUNTER — Other Ambulatory Visit: Payer: Self-pay | Admitting: Nurse Practitioner

## 2023-01-17 DIAGNOSIS — I10 Essential (primary) hypertension: Secondary | ICD-10-CM

## 2023-01-22 ENCOUNTER — Telehealth: Payer: Self-pay | Admitting: Nurse Practitioner

## 2023-01-22 NOTE — Telephone Encounter (Signed)
Patient dropped off document Home Health Assistance, to be filled out by provider. Patient requested to send it back via Call Patient to pick up within 7-days. Document is located in providers tray at front office.Please advise at Mobile [phone] (mobile)

## 2023-01-23 ENCOUNTER — Other Ambulatory Visit: Payer: Self-pay | Admitting: Nurse Practitioner

## 2023-01-23 DIAGNOSIS — I10 Essential (primary) hypertension: Secondary | ICD-10-CM

## 2023-01-27 DIAGNOSIS — Z741 Need for assistance with personal care: Secondary | ICD-10-CM | POA: Diagnosis not present

## 2023-01-27 DIAGNOSIS — Z7409 Other reduced mobility: Secondary | ICD-10-CM | POA: Diagnosis not present

## 2023-01-27 DIAGNOSIS — R3915 Urgency of urination: Secondary | ICD-10-CM | POA: Diagnosis not present

## 2023-01-27 NOTE — Telephone Encounter (Signed)
Completed form place on Sarah desk for signature.Marland KitchenRaechel Chute

## 2023-01-29 ENCOUNTER — Ambulatory Visit: Payer: Self-pay | Admitting: Licensed Clinical Social Worker

## 2023-01-29 NOTE — Telephone Encounter (Signed)
Notified daughter Gunnar Fusi) PCS forms have been completed. She is wanting forms fax to [facility]. Faxed forms to [phone]../l,mb

## 2023-01-29 NOTE — Patient Outreach (Signed)
  Care Coordination  Initial Visit Note   01/29/2023 Name: DONYEA GAFFORD MRN: [id number] DOB: 1930/04/10  LUDDIE BOGHOSIAN is a 89 y.o. year old female who sees Elenore Paddy, NP for primary care. I spoke with  Gus Height by phone today.  What matters to the patients health and wellness today?  Counseling for self, son and daughter  Patient currently lives with son and daughter.  All family members would like counseling, however transportation is a barrier.  There are technology challenges so unable to do virtual.  Patient will speak with her grandson to see if he will assist with transportation and for LCSW to educate him on Medicaid Transportation to see if this is an option.    Goals Addressed             This Visit's Progress    Care Coordination for community support       Activities and task to complete in order to accomplish goals.   Keep all upcoming appointment discussed today Continue with compliance of taking medication prescribed by Doctor Per your request we will talk with your grandson about transportation before exploring counseling options for you, your son and daughter.         SDOH assessments and interventions completed:  No   Care Coordination Interventions:  Yes, provided  Interventions Today    Flowsheet Row Most Recent Value  Chronic Disease   Chronic disease during today's visit Hypertension (HTN)  General Interventions   General Interventions Discussed/Reviewed General Interventions Discussed, Community Resources, Level of Care  Level of Care Personal Care Services  [has aide 2 hours per day/ PCS]  Education Interventions   Education Provided Provided Education  Provided Verbal Education On Development worker, community, [employer]  Mental Health Interventions   Mental Health Discussed/Reviewed Mental Health Discussed  [would like to connect for counseling. / multi barriers]  Safety Interventions   Safety Discussed/Reviewed Safety Discussed        Follow up plan:  no f/u scheduled patient states will call back.  If not call received in 7 days will f/u with patient.     Encounter Outcome:  Pt. Visit Completed   Sammuel Hines, LCSW Social Work Care Coordination  [facility] [employer] [employer] [phone]

## 2023-01-29 NOTE — Patient Instructions (Signed)
Social Work Visit Information  Thank you for taking time to visit with me today. Please don't hesitate to contact me if I can be of assistance to you.   Following are the goals we discussed today:   Goals Addressed             This Visit's Progress    Care Coordination for community support       Activities and task to complete in order to accomplish goals.   Keep all upcoming appointment discussed today Continue with compliance of taking medication prescribed by Doctor Per your request we will talk with your grandson about transportation before exploring counseling options for you, your son and daughter.           No follow up scheduled with social work at this time. Will follow up in 7 days .Patient will call office if needed prior to next encounter.  Please call the care guide team at [phone] if you need to cancel or reschedule your appointment.    The patient verbalized understanding of instructions, educational materials, and care plan provided today and DECLINED offer to receive copy of patient instructions, educational materials, and care plan.    Sammuel Hines, LCSW Social Work Care Coordination  [facility] [employer] [employer] [phone]

## 2023-02-05 ENCOUNTER — Telehealth: Payer: Self-pay | Admitting: Licensed Clinical Social Worker

## 2023-02-05 NOTE — Patient Outreach (Signed)
  Care Coordination   02/05/2023 Name: Amy Gallagher MRN: [id number] DOB: [birth year]   Care Coordination Outreach Attempts:  An unsuccessful telephone outreach was attempted today to offer the patient information about available care coordination services. Left message with daughter who answered the phone. She will ask patient to call back.  Follow Up Plan:  Additional outreach attempts will be made to offer the patient care coordination information and services.   Encounter Outcome:  Pt. Request to Call Back   Care Coordination Interventions:  No, not indicated    Sammuel Hines, LCSW Social Work Care Coordination  [facility] [employer] [phone]

## 2023-02-25 DIAGNOSIS — R3915 Urgency of urination: Secondary | ICD-10-CM | POA: Diagnosis not present

## 2023-02-25 DIAGNOSIS — Z7409 Other reduced mobility: Secondary | ICD-10-CM | POA: Diagnosis not present

## 2023-02-25 DIAGNOSIS — Z741 Need for assistance with personal care: Secondary | ICD-10-CM | POA: Diagnosis not present

## 2023-02-26 ENCOUNTER — Ambulatory Visit: Payer: Self-pay | Admitting: Licensed Clinical Social Worker

## 2023-02-26 NOTE — Patient Outreach (Signed)
  Care Coordination  Follow Up Visit Note   02/26/2023 Name: Amy Gallagher MRN: [id number] DOB: 1930-07-05  Amy Gallagher is a 89 y.o. year old female who sees Elenore Paddy, NP for primary care. I spoke with  Amy Gallagher by phone today.  What matters to the patients health and wellness today?  Getting connected for counseling  Patient continues to experience barriers with moving forward for counseling.  Reports she is not feeling to good today and would like to wait until she gets her new wheelchair.  She is unsure about transportation once the therapy appointments are scheduled.  Will need to spend additional time assessing barriers to care prior to setting up therapy.   Goals Addressed             This Visit's Progress    COMPLETED: Connect for counseling       Activities and task to complete in order to accomplish goals.   Keep all upcoming appointment discussed today Continue with compliance of taking medication prescribed by Doctor Per your request we will talk with your grandson about transportation before exploring counseling options for you.   You have decided to wait until you get your new wheelchair before moving forward with counseling.  I have scheduled you with Scott. He will assist you with connecting for counseling or provide support.       SDOH assessments and interventions completed:  No   Care Coordination Interventions:  Yes, provided  Interventions Today    Flowsheet Row Most Recent Value  Chronic Disease   Chronic disease during today's visit Hypertension (HTN)  Education Interventions   Education Provided Provided Education  Provided Verbal Education On Mental Health/Coping with Illness  Mental Health Interventions   Mental Health Discussed/Reviewed Mental Health Reviewed  [solution focused, problem solving, and barriers to care]       Follow up plan: Referral made to new LCSW Scott Forrest for ongoing support.    Encounter Outcome:  Pt.  Visit Completed   Sammuel Hines, LCSW Social Work Care Coordination  [facility] [employer] [employer] [phone]

## 2023-02-26 NOTE — Patient Instructions (Signed)
Social Work Visit Information  Thank you for taking time to visit with me today. Please don't hesitate to contact me if I can be of assistance to you.   Following are the goals we discussed today:   Goals Addressed             This Visit's Progress    COMPLETED: Connect for counseling       Activities and task to complete in order to accomplish goals.   Keep all upcoming appointment discussed today Continue with compliance of taking medication prescribed by Doctor Per your request we will talk with your grandson about transportation before exploring counseling options for you.   You have decided to wait until you get your new wheelchair before moving forward with counseling.  I have scheduled you with Scott. He will assist you with connecting for counseling or provide support.        New appointment schedule with LCSW 03/23/23  Please call the care guide team at [phone] if you need to cancel or reschedule your appointment.    The patient verbalized understanding of instructions, educational materials, and care plan provided today and DECLINED offer to receive copy of patient instructions, educational materials, and care plan.    Sammuel Hines, LCSW Social Work Care Coordination  [facility] [employer] [phone]

## 2023-03-23 ENCOUNTER — Ambulatory Visit: Payer: Self-pay | Admitting: Licensed Clinical Social Worker

## 2023-03-23 NOTE — Patient Instructions (Signed)
Visit Information  Thank you for taking time to visit with me today. Please don't hesitate to contact me if I can be of assistance to you.   Following are the goals we discussed today:   Goals Addressed             This Visit's Progress    patient said she is interested in family counseling  (for herself, for her son and for her daugher as a group)       Interventions: Spoke with client about client needs Spoke with client about her past support with Sammuel Hines LCSW. Informed Kindle Coney that Sammuel Hines had referred client case to Lorna Few LCSW and that Lorna Few LCSW would be helping her with SW needs going forward. Client understood this information and agreed to communicate with LCSW Lorna Few Talked with client about pain issues. Talked with client about sleeping issues. Discussed medication procurement for client Discussed counseling needs. Discussed individual counseling support for client; however, client is interested in counseling for herself, for her son and for her daughter (as a group).  LCSW recommended that client call [facility] [place] at [phone] to talk with agency about counseling needs. Client wrote down phone number of agency. LCSW mentioned to client that appointments may require client and family to come to [facility] [place] to have appointment in person at agency. Client said she would call agency to discuss counseling needs of client and family Discussed wheelchair needs of client. Client is wanting a Manufacturing engineer wheelchair. LCSW suggested to client that she call NP Jiles Prows and talk with NP about client request for wheelchair.  Client said she would call NP to talk about her request for a manuel wheelchair Discussed program support Encouraged client to call LCSW at [phone] as needed for SW support Mikaylah Malki was appreciative of call from LCSW today          Our next appointment is by telephone on 04/28/23  at 2:30 PM   Please call the care guide team at [phone] if you need to cancel or reschedule your appointment.   If you are experiencing a Mental Health or Behavioral Health Crisis or need someone to talk to, please go to [facility] [address], [place] [phone])   The patient verbalized understanding of instructions, educational materials, and care plan provided today and DECLINED offer to receive copy of patient instructions, educational materials, and care plan.   The patient has been provided with contact information for the care management team and has been advised to call with any health related questions or concerns.   Kelton Pillar.Lakara Weiland MSW, LCSW Licensed Visual merchandiser [facility] Care Management [phone]

## 2023-03-23 NOTE — Patient Outreach (Signed)
Care Coordination   Follow Up Visit Note   03/23/2023 Name: Amy Gallagher MRN: [id number] DOB: 1929-11-27  Amy Gallagher is a 89 y.o. year old female who sees Elenore Paddy, NP for primary care. I spoke with  Gus Height by phone today.  What matters to the patients health and wellness today?  Patient said she is interested in family   counseling (for herself, for her son and for her daughter as a group)     Goals Addressed             This Visit's Progress    patient said she is interested in family counseling  (for herself, for her son and for her daugher as a group)       Interventions: Spoke with client about client needs Spoke with client about her past support with Sammuel Hines LCSW. Informed Life [place]respo that Sammuel Hines had referred client case to Lorna Few LCSW and that Lorna Few LCSW would be helping her with SW needs going forward. Client understood this information and agreed to communicate with LCSW Lorna Few Talked with client about pain issues. Talked with client about sleeping issues. Discussed medication procurement for client Discussed counseling needs. Discussed individual counseling support for client; however, client is interested in counseling for herself, for her son and for her daughter (as a group).  LCSW recommended that client call [facility] [place] at [phone] to talk with agency about counseling needs. Client wrote down phone number of agency. LCSW mentioned to client that appointments may require client and family to come to [facility] [place] to have appointment in person at agency. Client said she would call agency to discuss counseling needs of client and family Discussed wheelchair needs of client. Client is wanting a Manufacturing engineer wheelchair. LCSW suggested to client that she call NP Jiles Prows and talk with NP about client request for wheelchair.  Client said she would call NP to talk about her request for a  manuel wheelchair Discussed program support Encouraged client to call LCSW at [phone] as needed for SW support Lonna Mas was appreciative of call from LCSW today          SDOH assessments and interventions completed:  Yes  SDOH Interventions Today    Flowsheet Row Most Recent Value  SDOH Interventions   Physical Activity Interventions Other (Comments)  [mobility challenges]  Stress Interventions Provide Counseling  [has stress related to family issues]        Care Coordination Interventions:  Yes, provided   Interventions Today    Flowsheet Row Most Recent Value  Chronic Disease   Chronic disease during today's visit Other  [spoke with client about client needs]  General Interventions   General Interventions Discussed/Reviewed General Interventions Discussed, Community Resources  Exercise Interventions   Exercise Discussed/Reviewed Physical Activity  Education Interventions   Education Provided Provided Education  Provided Verbal Education On Community Resources  Mental Health Interventions   Mental Health Discussed/Reviewed Coping Strategies  [client has support from her son and from her daughter. Is seeking counseling support for family group]  Nutrition Interventions   Nutrition Discussed/Reviewed Nutrition Discussed  Pharmacy Interventions   Pharmacy Dicussed/Reviewed Pharmacy Topics Discussed  Safety Interventions   Safety Discussed/Reviewed Fall Risk        Follow up plan: Follow up call scheduled for 04/28/23 at 2:30 PM     Encounter Outcome:  Patient Visit Completed   Kelton Pillar.Lizzett Nobile MSW, [employer] Licensed  Clinical Social Worker [facility] Care Management [phone]

## 2023-04-01 DIAGNOSIS — R3915 Urgency of urination: Secondary | ICD-10-CM | POA: Diagnosis not present

## 2023-04-01 DIAGNOSIS — Z7409 Other reduced mobility: Secondary | ICD-10-CM | POA: Diagnosis not present

## 2023-04-01 DIAGNOSIS — Z741 Need for assistance with personal care: Secondary | ICD-10-CM | POA: Diagnosis not present

## 2023-04-11 ENCOUNTER — Other Ambulatory Visit: Payer: Self-pay | Admitting: Nurse Practitioner

## 2023-04-11 DIAGNOSIS — J45909 Unspecified asthma, uncomplicated: Secondary | ICD-10-CM

## 2023-04-11 DIAGNOSIS — R062 Wheezing: Secondary | ICD-10-CM

## 2023-04-25 ENCOUNTER — Other Ambulatory Visit: Payer: Self-pay | Admitting: Nurse Practitioner

## 2023-04-28 ENCOUNTER — Encounter: Payer: Medicare HMO | Admitting: Licensed Clinical Social Worker

## 2023-04-28 DIAGNOSIS — R3915 Urgency of urination: Secondary | ICD-10-CM | POA: Diagnosis not present

## 2023-04-28 DIAGNOSIS — Z7409 Other reduced mobility: Secondary | ICD-10-CM | POA: Diagnosis not present

## 2023-04-28 DIAGNOSIS — Z741 Need for assistance with personal care: Secondary | ICD-10-CM | POA: Diagnosis not present

## 2023-04-29 ENCOUNTER — Ambulatory Visit: Payer: Self-pay | Admitting: Licensed Clinical Social Worker

## 2023-04-29 NOTE — Patient Outreach (Signed)
  Care Coordination   Follow Up Visit Note   04/29/2023 Name: CANDA PODGORSKI MRN: [id number] DOB: 22-Oct-1929  PHALA SCHRAEDER is a 89 y.o. year old female who sees Elenore Paddy, NP for primary care. I spoke with  Gus Height / Blake Divine, daughter, via phone today.  What matters to the patients health and wellness today?  patient said she is interested in family counseling  (for herself, for her son and for her daugher as a group)     Goals Addressed             This Visit's Progress    patient said she is interested in family counseling  (for herself, for her son and for her daugher as a group)       Interventions: Spoke with Blake Divine, daughter of client, about client needs Discussed transport needs of client. Gunnar Fusi said that her family transports client to and from client appointments Discussed medication procurement for client. Discussed program support for client with RN, LCSW, Pharmacist Discussed client support with Elenore Paddy NP Discussed family support for client. Client's daughters are very supportive of client. Thanked Blake Divine for phone call today with LCSW Encouraged client or Blake Divine to call LCSW at [phone] as needed for SW support for Gus Height Spoke with Gunnar Fusi about nursing support services possibly for client         SDOH assessments and interventions completed:  Yes  SDOH Interventions Today    Flowsheet Row Most Recent Value  SDOH Interventions   Physical Activity Interventions Other (Comments)  [mobility issues]  Stress Interventions Other (Comment)  [has stress in managing medical needs]        Care Coordination Interventions:  Yes, provided    Interventions Today    Flowsheet Row Most Recent Value  Chronic Disease   Chronic disease during today's visit Other  [spoke with Blake Divine, daughter, about client needs]  General Interventions   General Interventions Discussed/Reviewed General Interventions  Discussed, Community Resources  Exercise Interventions   Exercise Discussed/Reviewed Physical Activity  Education Interventions   Education Provided Provided Education  Provided Verbal Education On Community Resources  Mental Health Interventions   Mental Health Discussed/Reviewed Coping Strategies  [client needs help with transport, meals and daily care activities]  Nutrition Interventions   Nutrition Discussed/Reviewed Nutrition Discussed  Pharmacy Interventions   Pharmacy Dicussed/Reviewed Pharmacy Topics Discussed       Follow up plan: Follow up call scheduled for 06/08/23 at 2:30 PM     Encounter Outcome:  Patient Visit Completed   Kelton Pillar.Verbie Babic MSW, LCSW Licensed Visual merchandiser [facility] Care Management [phone]

## 2023-04-29 NOTE — Patient Instructions (Signed)
Visit Information  Thank you for taking time to visit with me today. Please don't hesitate to contact me if I can be of assistance to you.   Following are the goals we discussed today:   Goals Addressed             This Visit's Progress    patient said she is interested in family counseling  (for herself, for her son and for her daugher as a group)       Interventions: Spoke with Blake Divine, daughter of client, about client needs Discussed transport needs of client. Gunnar Fusi said that her family transports client to and from client appointments Discussed medication procurement for client. Discussed program support for client with RN, LCSW, Pharmacist Discussed client support with Elenore Paddy NP Discussed family support for client. Client's daughters are very supportive of client. Thanked Blake Divine for phone call today with LCSW Encouraged client or Blake Divine to call LCSW at [phone] as needed for SW support for Gus Height Spoke with Gunnar Fusi about nursing support services possibly for client         Our next appointment is by telephone on 06/08/23 at 2:30 PM   Please call the care guide team at [phone] if you need to cancel or reschedule your appointment.   If you are experiencing a Mental Health or Behavioral Health Crisis or need someone to talk to, please go to [facility] [address] [place] [phone])   The patient / Blake Divine, daughter, verbalized understanding of instructions, educational materials, and care plan provided today and DECLINED offer to receive copy of patient instructions, educational materials, and care plan.   The patient / Blake Divine, daughter, has been provided with contact information for the care management team and has been advised to call with any health related questions or concerns.   Kelton Pillar.Calieb Lichtman MSW, LCSW Licensed Visual merchandiser [facility] Care  Management [phone]

## 2023-05-29 DIAGNOSIS — R3915 Urgency of urination: Secondary | ICD-10-CM | POA: Diagnosis not present

## 2023-05-29 DIAGNOSIS — Z7409 Other reduced mobility: Secondary | ICD-10-CM | POA: Diagnosis not present

## 2023-05-29 DIAGNOSIS — Z741 Need for assistance with personal care: Secondary | ICD-10-CM | POA: Diagnosis not present

## 2023-06-08 ENCOUNTER — Ambulatory Visit: Payer: Self-pay | Admitting: Licensed Clinical Social Worker

## 2023-06-08 NOTE — Patient Outreach (Signed)
  Care Coordination   Follow Up Visit Note   06/08/2023 Name: Amy Gallagher MRN: [id number] DOB: 1930-01-19  CELSA FROGGE is a 89 y.o. year old female who sees Elenore Paddy, NP for primary care. I spoke with  Gus Height by phone today.  What matters to the patients health and wellness today?  Patient Stated she had some back pain issues. she spoke of wanting to talk with PCP office about help with getting another wheelchair    Goals Addressed             This Visit's Progress    Patient Stated she had some back pain issues. she spoke of wanting to talk with PCP office about help with getting another wheelchair       Interventions: Spoke with client about client needs Discussed ambulation needs. She uses a wheelchair to help her ambulate. She spoke of back pain issues. She said she wanted to call PCP office to talk with representative at office about help possibly in getting another wheelchair for client Discussed medication procurement Discussed support from her daughter, Blake Divine. Discussed support from her grandson Discussed cushions client has for her wheelchair. She has 2 wheelchair cushions, per client information Discussed transport needs. She said that her family will help transport her to and from medical appointments. However, her grandson helps her get in and out of vehicles. She uses a wheelchair for ambulation Client said she has lift chair to use in the home.  Discussed client medical support with Elenore Paddy NP Used Active Listening techniques to allow client to share her feelings Provided some counseling support Thanked client for phone call today with LCSW Encouraged client or her daughter, Gunnar Fusi, to call LCSW as needed for SW support for client at [phone] Client was appreciative of phone call today from LCSW          SDOH assessments and interventions completed:  Yes  SDOH Interventions Today    Flowsheet Row Most Recent Value  SDOH  Interventions   Physical Activity Interventions Other (Comments)  [client uses a wheelchair to help her ambulate]  Stress Interventions Provide Counseling  [client has stress in managing medical needs. she spoke of back pain issue]        Care Coordination Interventions:  Yes, provided  Interventions Today    Flowsheet Row Most Recent Value  Chronic Disease   Chronic disease during today's visit Other  [spoke with client about client needs]  General Interventions   General Interventions Discussed/Reviewed General Interventions Discussed, Community Resources  Education Interventions   Education Provided Provided Education  Provided Verbal Education On [employer]  Mental Health Interventions   Mental Health Discussed/Reviewed Coping Strategies  Nutrition Interventions   Nutrition Discussed/Reviewed Nutrition Discussed  Pharmacy Interventions   Pharmacy Dicussed/Reviewed Pharmacy Topics Discussed  Safety Interventions   Safety Discussed/Reviewed Fall Risk       Follow up plan: Follow up call scheduled for 07/27/23 at 10:00 AM    Encounter Outcome:  Patient Visit Completed   Kelton Pillar.Dacotah Cabello MSW, LCSW Licensed Visual merchandiser [facility] [phone]

## 2023-06-08 NOTE — Patient Instructions (Signed)
Visit Information  Thank you for taking time to visit with me today. Please don't hesitate to contact me if I can be of assistance to you.   Following are the goals we discussed today:   Goals Addressed             This Visit's Progress    Patient Stated she had some back pain issues. she spoke of wanting to talk with PCP office about help with getting another wheelchair       Interventions: Spoke with client about client needs Discussed ambulation needs. She uses a wheelchair to help her ambulate. She spoke of back pain issues. She said she wanted to call PCP office to talk with representative at office about help possibly in getting another wheelchair for client Discussed medication procurement Discussed support from her daughter, Blake Divine. Discussed support from her grandson Discussed cushions client has for her wheelchair. She has 2 wheelchair cushions, per client information Discussed transport needs. She said that her family will help transport her to and from medical appointments. However, her grandson helps her get in and out of vehicles. She uses a wheelchair for ambulation Client said she has lift chair to use in the home.  Discussed client medical support with Elenore Paddy NP Used Active Listening techniques to allow client to share her feelings Provided some counseling support Thanked client for phone call today with LCSW Encouraged client or her daughter, Gunnar Fusi, to call LCSW as needed for SW support for client at [phone] Client was appreciative of phone call today from LCSW          Our next appointment is by telephone on 07/27/23 at 10:00 AM  Please call the care guide team at [phone] if you need to cancel or reschedule your appointment.   If you are experiencing a Mental Health or Behavioral Health Crisis or need someone to talk to, please go to [facility] [address] [place] [phone])   The patient  verbalized understanding of instructions, educational materials, and care plan provided today and DECLINED offer to receive copy of patient instructions, educational materials, and care plan.   The patient has been provided with contact information for the care management team and has been advised to call with any health related questions or concerns.   Kelton Pillar.Frutoso Dimare MSW, LCSW Licensed Visual merchandiser [facility] Management [phone]

## 2023-06-29 DIAGNOSIS — Z7409 Other reduced mobility: Secondary | ICD-10-CM | POA: Diagnosis not present

## 2023-06-29 DIAGNOSIS — R3915 Urgency of urination: Secondary | ICD-10-CM | POA: Diagnosis not present

## 2023-06-29 DIAGNOSIS — Z741 Need for assistance with personal care: Secondary | ICD-10-CM | POA: Diagnosis not present

## 2023-07-02 ENCOUNTER — Ambulatory Visit: Payer: Medicare HMO | Admitting: Nurse Practitioner

## 2023-07-12 ENCOUNTER — Other Ambulatory Visit: Payer: Self-pay | Admitting: Nurse Practitioner

## 2023-07-12 DIAGNOSIS — R062 Wheezing: Secondary | ICD-10-CM

## 2023-07-12 DIAGNOSIS — J45909 Unspecified asthma, uncomplicated: Secondary | ICD-10-CM

## 2023-07-12 DIAGNOSIS — I10 Essential (primary) hypertension: Secondary | ICD-10-CM

## 2023-07-16 ENCOUNTER — Ambulatory Visit (INDEPENDENT_AMBULATORY_CARE_PROVIDER_SITE_OTHER): Payer: Medicare HMO | Admitting: Nurse Practitioner

## 2023-07-16 ENCOUNTER — Other Ambulatory Visit (HOSPITAL_COMMUNITY): Payer: Self-pay

## 2023-07-16 ENCOUNTER — Other Ambulatory Visit: Payer: Self-pay

## 2023-07-16 ENCOUNTER — Ambulatory Visit: Payer: Medicare HMO

## 2023-07-16 VITALS — BP 154/82 | HR 67 | Temp 97.6°F | Ht 64.0 in

## 2023-07-16 DIAGNOSIS — I10 Essential (primary) hypertension: Secondary | ICD-10-CM | POA: Diagnosis not present

## 2023-07-16 DIAGNOSIS — M109 Gout, unspecified: Secondary | ICD-10-CM | POA: Diagnosis not present

## 2023-07-16 DIAGNOSIS — M25571 Pain in right ankle and joints of right foot: Secondary | ICD-10-CM | POA: Diagnosis not present

## 2023-07-16 DIAGNOSIS — Z7409 Other reduced mobility: Secondary | ICD-10-CM

## 2023-07-16 DIAGNOSIS — M19071 Primary osteoarthritis, right ankle and foot: Secondary | ICD-10-CM | POA: Diagnosis not present

## 2023-07-16 LAB — COMPREHENSIVE METABOLIC PANEL
ALT: 13 U/L (ref 0–35)
AST: 19 U/L (ref 0–37)
Albumin: 4.1 g/dL (ref 3.5–5.2)
Alkaline Phosphatase: 56 U/L (ref 39–117)
BUN: 13 mg/dL (ref 6–23)
CO2: 32 meq/L (ref 19–32)
Calcium: 10 mg/dL (ref 8.4–10.5)
Chloride: 101 meq/L (ref 96–112)
Creatinine, Ser: 0.75 mg/dL (ref 0.40–1.20)
GFR: 69.46 mL/min (ref >60.00)
Glucose, Bld: 74 mg/dL (ref 70–99)
Potassium: 4.7 meq/L (ref 3.5–5.1)
Sodium: 140 meq/L (ref 135–145)
Total Bilirubin: 0.4 mg/dL (ref 0.2–1.2)
Total Protein: 8 g/dL (ref 6.0–8.3)

## 2023-07-16 MED ORDER — ALLOPURINOL 100 MG PO TABS
50.0000 mg | ORAL_TABLET | ORAL | 2 refills | Status: DC
  Filled 2023-07-16 (×2): qty 8, 32d supply, fill #0
  Filled 2023-08-18: qty 8, 32d supply, fill #1
  Filled 2023-08-21: qty 8, 32d supply, fill #2

## 2023-07-16 MED ORDER — HYDROCHLOROTHIAZIDE 25 MG PO TABS
25.0000 mg | ORAL_TABLET | Freq: Every day | ORAL | 1 refills | Status: DC
  Filled 2023-07-16 (×2): qty 90, 90d supply, fill #0

## 2023-07-16 MED ORDER — ALLOPURINOL 100 MG PO TABS
50.0000 mg | ORAL_TABLET | Freq: Every day | ORAL | 2 refills | Status: DC
  Filled 2023-07-16: qty 15, 30d supply, fill #0

## 2023-07-16 NOTE — Patient Instructions (Signed)
 Elevate ankle when at rest Use an ACE wrap throughout the day and at night as needed to help provide ankle support. Remove for at least a few hours as day.  Use ice (apply for 20 minutes on and then take off for at least 20 minutes) throughout the day as needed Take tylenol  1000mg  every 8 hours (do not exceed 3000mg  in a 24 hour period)

## 2023-07-16 NOTE — Assessment & Plan Note (Signed)
 Chronic Blood pressure above goal today, patient reports having not taken her antihypertensive today and needs refill.  She also is in pain. For now she would like to continue on hydrochlorothiazide  once a day.  Prescription sent to pharmacy, check BMP to monitor kidney function and electrolytes.

## 2023-07-16 NOTE — Assessment & Plan Note (Signed)
 Chronic, intermittent Per patient last gout flare was a while ago, but she would like to be on preventative medication.  Will start her on allopurinol 50 mg every other day.  Follow-up in 2 months for repeat uric acid level.

## 2023-07-16 NOTE — Progress Notes (Signed)
 Established Patient Office Visit  Subjective   Patient ID: Amy Gallagher, female    DOB: 1930/02/21  Age: 89 y.o. MRN: [id number]  Chief Complaint  Patient presents with   Medical Management of Chronic Issues    Follow up, wheelchair order and sore ankle    Ankle Pain    Patient arrives for follow-up.  She reports that she was scooting herself in her wheelchair using her feet on Monday, her right ankle rolled inward while doing this and has been painful ever since.  She reports pain can get as severe as 8 out of 10 when she puts pressure or tries to bear weight on the ankle.  When at rest ankle is not causing pain.  It is now swollen.  She denies fall.  Gout: Reports history of gout, last uric acid level greater than 7.  Not currently on preventative treatment.  Would like to start preventative treatment to prevent a gout flare.  Cannot recall last gout flare, reports it was  a while ago.  Hypertension: Currently on hydrochlorothiazide  25mg /day she takes this inconsistently.  Reports she is completely out of it and will need refill.    ROS: see HPI    Objective:     BP (!) 154/82   Pulse 67   Temp 97.6 F (36.4 C) (Temporal)   Ht 5' 4 (1.626 m)   SpO2 97%   BMI 30.73 kg/m  BP Readings from Last 3 Encounters:  07/16/23 (!) 154/82  12/05/22 (!) 156/82  08/09/19 (!) 165/76   Wt Readings from Last 3 Encounters:  02/12/22 179 lb (81.2 kg)  02/27/20 185 lb (83.9 kg)  12/28/19 182 lb (82.6 kg)      Physical Exam Vitals reviewed.  Constitutional:      General: She is not in acute distress.    Appearance: Normal appearance.  HENT:     Head: Normocephalic and atraumatic.  Cardiovascular:     Rate and Rhythm: Normal rate and regular rhythm.     Pulses: Normal pulses.     Heart sounds: Normal heart sounds.  Pulmonary:     Effort: Pulmonary effort is normal.     Breath sounds: Normal breath sounds.  Musculoskeletal:     Right ankle: Swelling present. No  deformity or ecchymosis. Tenderness present. Normal pulse.     Left ankle: Normal.  Skin:    General: Skin is warm and dry.  Neurological:     General: No focal deficit present.     Mental Status: She is alert and oriented to person, place, and time.  Psychiatric:        Mood and Affect: Mood normal.        Behavior: Behavior normal.        Judgment: Judgment normal.      No results found for any visits on 07/16/23.    The ASCVD Risk score (Arnett DK, et al., 2019) failed to calculate for the following reasons:   The 2019 ASCVD risk score is only valid for ages 23 to 56    Assessment & Plan:   Problem List Items Addressed This Visit       Cardiovascular and Mediastinum   Essential hypertension   Chronic Blood pressure above goal today, patient reports having not taken her antihypertensive today and needs refill.  She also is in pain. For now she would like to continue on hydrochlorothiazide  once a day.  Prescription sent to pharmacy, check BMP to monitor kidney function  and electrolytes.      Relevant Medications   hydrochlorothiazide  (HYDRODIURIL ) 25 MG tablet   Other Relevant Orders   Comprehensive metabolic panel     Other   Gout   Chronic, intermittent Per patient last gout flare was a while ago, but she would like to be on preventative medication.  Will start her on allopurinol  50 mg every other day.  Follow-up in 2 months for repeat uric acid level.      Relevant Medications   allopurinol  (ZYLOPRIM ) 100 MG tablet   Limited mobility   Chronic Patient continues to have limited mobility and recently rolled her ankle while trying to mobilize a manual wheelchair.  I think she would be a candidate for electronic wheelchair, will order prescription.      Relevant Orders   DME Wheelchair electric   Acute right ankle pain - Primary   Acute, occurred after rolling ankle inward. Strong dorsalis pedis pulse identified, no skin color changes.  Significant swelling  identified.  Will order x-ray to rule out fracture.  In the meantime patient encouraged to rest extremity, elevate extremity, use Ace wrap compression bandage majority of day but to take off for at least a few hours every day, elevate leg, and treat with as needed Tylenol  up to 1000 mg 3 times daily but not to exceed 3000 mg in a 24-hour period she reports her understanding.      Relevant Orders   DG Ankle Complete Right    Return in about 2 months (around 09/13/2023) for F/U with Wilfrid Hyser.    [name], NP

## 2023-07-16 NOTE — Assessment & Plan Note (Signed)
 Chronic Patient continues to have limited mobility and recently rolled her ankle while trying to mobilize a manual wheelchair.  I think she would be a candidate for electronic wheelchair, will order prescription.

## 2023-07-16 NOTE — Assessment & Plan Note (Signed)
 Acute, occurred after rolling ankle inward. Strong dorsalis pedis pulse identified, no skin color changes.  Significant swelling identified.  Will order x-ray to rule out fracture.  In the meantime patient encouraged to rest extremity, elevate extremity, use Ace wrap compression bandage majority of day but to take off for at least a few hours every day, elevate leg, and treat with as needed Tylenol  up to 1000 mg 3 times daily but not to exceed 3000 mg in a 24-hour period she reports her understanding.

## 2023-07-24 ENCOUNTER — Other Ambulatory Visit: Payer: Self-pay

## 2023-07-24 ENCOUNTER — Other Ambulatory Visit (HOSPITAL_COMMUNITY): Payer: Self-pay

## 2023-07-24 DIAGNOSIS — I10 Essential (primary) hypertension: Secondary | ICD-10-CM

## 2023-07-24 DIAGNOSIS — R062 Wheezing: Secondary | ICD-10-CM

## 2023-07-24 DIAGNOSIS — J45909 Unspecified asthma, uncomplicated: Secondary | ICD-10-CM

## 2023-07-24 MED ORDER — ALBUTEROL SULFATE HFA 108 (90 BASE) MCG/ACT IN AERS
INHALATION_SPRAY | RESPIRATORY_TRACT | 1 refills | Status: DC
  Filled 2023-07-24 – 2023-07-28 (×2): qty 6.7, 17d supply, fill #0
  Filled 2023-08-18: qty 6.7, 17d supply, fill #1

## 2023-07-24 MED ORDER — ARNUITY ELLIPTA 50 MCG/ACT IN AEPB
1.0000 | INHALATION_SPRAY | Freq: Every day | RESPIRATORY_TRACT | 1 refills | Status: DC
  Filled 2023-07-24: qty 60, 30d supply, fill #0
  Filled 2023-07-28: qty 60, 60d supply, fill #0

## 2023-07-24 MED ORDER — FLUTICASONE PROPIONATE 50 MCG/ACT NA SUSP
2.0000 | Freq: Every day | NASAL | 3 refills | Status: DC
  Filled 2023-07-24 – 2023-07-28 (×2): qty 48, 90d supply, fill #0
  Filled 2023-08-18: qty 48, 84d supply, fill #0
  Filled 2023-08-20: qty 48, 90d supply, fill #0

## 2023-07-24 MED ORDER — HYDROCHLOROTHIAZIDE 25 MG PO TABS
25.0000 mg | ORAL_TABLET | Freq: Every day | ORAL | 1 refills | Status: DC
Start: 2023-07-24 — End: 2023-09-17
  Filled 2023-07-24 – 2023-08-18 (×3): qty 90, 90d supply, fill #0

## 2023-07-25 ENCOUNTER — Other Ambulatory Visit (HOSPITAL_BASED_OUTPATIENT_CLINIC_OR_DEPARTMENT_OTHER): Payer: Self-pay

## 2023-07-27 ENCOUNTER — Ambulatory Visit: Payer: Self-pay | Admitting: Licensed Clinical Social Worker

## 2023-07-27 NOTE — Patient Outreach (Signed)
 Care Coordination   Follow Up Visit Note   07/27/2023 Name: Amy Gallagher MRN: [id number] DOB: [birth year]  Amy Gallagher is a 89 y.o. year old female who sees Amy Amy BRAVO, Gallagher for primary care. I spoke with  Amy Gallagher by phone today.  What matters to the patients health and wellness today?  Patient Stated she had some back pain issues. she spoke of wanting to talk with PCP office about help with getting another wheelchair    Goals Addressed             This Visit's Progress    Patient Stated she had some back pain issues. she spoke of wanting to talk with PCP office about help with getting another wheelchair       Interventions:  Spoke with client via phone today about client needs Discussed ambulation needs. She uses a wheelchair to help her ambulate. She spoke of back pain issues. She said previously she wanted to call PCP office to talk with representative at office about help possibly in getting another wheelchair for client Client said she had spoken with Amy Gallagher about wheelchair needs of client. Amy Gallagher has ordered electric wheelchair for client.  Client said she has an older wheelchair she uses but it is hard to maneuver.  She is hoping to be able to get an electric wheelchair soon. She said bathroom doors are narrow at her home and current wheelchair has difficulty getting through these doors. She said she may buy 3 in 1 bedside commode to use temporarily until electric wheelchair arrives for her Discussed medication procurement for client Discussed support from her daughter, Amy Gallagher. Discussed support from her grandson Discussed transport needs. She said that her family will help transport her to and from medical appointments. However, her grandson helps her get in and out of vehicles. She uses a wheelchair for ambulation Client said she has lift chair to use in the home.  Discussed pain issues of client Discussed client medical support with  Amy Gallagher Used Active Listening techniques to allow client to share her feelings Provided some counseling support Thanked client for phone call today with LCSW Encouraged client or her daughter, Amy, to call LCSW as needed for SW support for client at [phone] Client was appreciative of phone call today from LCSW          SDOH assessments and interventions completed:  Yes  SDOH Interventions Today    Flowsheet Row Most Recent Value  SDOH Interventions   Physical Activity Interventions Other (Comments)  [uses wheelchair to ambulate]  Stress Interventions Provide Counseling  [has stress in managing mobility,  has stress in managing ADLs]        Care Coordination Interventions:  Yes, provided   Interventions Today    Flowsheet Row Most Recent Value  Chronic Disease   Chronic disease during today's visit Other  [spoke with client about client needs]  General Interventions   General Interventions Discussed/Reviewed General Interventions Discussed, Community Resources  Education Interventions   Education Provided Provided Education  Provided Verbal Education On [employer]  Mental Health Interventions   Mental Health Discussed/Reviewed Coping Strategies  [trying to cope with daily needs,  has mobility challenges]  Nutrition Interventions   Nutrition Discussed/Reviewed Nutrition Discussed  Pharmacy Interventions   Pharmacy Dicussed/Reviewed Pharmacy Topics Discussed  Safety Interventions   Safety Discussed/Reviewed Fall Risk        Follow up plan: Follow up  call scheduled for 09/28/23 at 1:00 PM     Encounter Outcome:  Patient Visit Completed   Amy Gallagher MSW, LCSW Licensed Visual Merchandiser [facility] [phone]

## 2023-07-27 NOTE — Patient Instructions (Signed)
 Visit Information  Thank you for taking time to visit with me today. Please don't hesitate to contact me if I can be of assistance to you.   Following are the goals we discussed today:   Goals Addressed             This Visit's Progress    Patient Stated she had some back pain issues. she spoke of wanting to talk with PCP office about help with getting another wheelchair       Interventions:  Spoke with client via phone today about client needs Discussed ambulation needs. She uses a wheelchair to help her ambulate. She spoke of back pain issues. She said previously she wanted to call PCP office to talk with representative at office about help possibly in getting another wheelchair for client Client said she had spoken with [name] NP about wheelchair needs of client. [name] NP has ordered electric wheelchair for client.  Client said she has an older wheelchair she uses but it is hard to maneuver.  She is hoping to be able to get an electric wheelchair soon. She said bathroom doors are narrow at her home and current wheelchair has difficulty getting through these doors. She said she may buy 3 in 1 bedside commode to use temporarily until electric wheelchair arrives for her Discussed medication procurement Discussed support from her daughter, [name]. Discussed support from her grandson Discussed transport needs. She said that her family will help transport her to and from medical appointments. However, her grandson helps her get in and out of vehicles. She uses a wheelchair for ambulation Client said she has lift chair to use in the home.  Discussed pain issues of client Discussed client medical support with [name] NP Used Active Listening techniques to allow client to share her feelings Provided some counseling support Thanked client for phone call today with LCSW Encouraged client or her daughter, Vina, to call LCSW as needed for SW support for client at  [phone] Client was appreciative of phone call today from LCSW          Our next appointment is by telephone on 09/28/23 at 1:00 PM   Please call the care guide team at [phone] if you need to cancel or reschedule your appointment.   If you are experiencing a Mental Health or Behavioral Health Crisis or need someone to talk to, please go to [facility] [address] [place] [phone])   The patient verbalized understanding of instructions, educational materials, and care plan provided today and DECLINED offer to receive copy of patient instructions, educational materials, and care plan.   The patient has been provided with contact information for the care management team and has been advised to call with any health related questions or concerns.   Amy Gallagher.Becker Christopher MSW, LCSW Licensed Visual Merchandiser [facility] [phone]

## 2023-07-28 ENCOUNTER — Other Ambulatory Visit (HOSPITAL_COMMUNITY): Payer: Self-pay

## 2023-07-29 ENCOUNTER — Other Ambulatory Visit (HOSPITAL_COMMUNITY): Payer: Self-pay

## 2023-07-29 ENCOUNTER — Other Ambulatory Visit: Payer: Self-pay

## 2023-07-30 ENCOUNTER — Other Ambulatory Visit (HOSPITAL_COMMUNITY): Payer: Self-pay

## 2023-07-31 DIAGNOSIS — Z741 Need for assistance with personal care: Secondary | ICD-10-CM | POA: Diagnosis not present

## 2023-07-31 DIAGNOSIS — R3915 Urgency of urination: Secondary | ICD-10-CM | POA: Diagnosis not present

## 2023-07-31 DIAGNOSIS — Z7409 Other reduced mobility: Secondary | ICD-10-CM | POA: Diagnosis not present

## 2023-08-07 ENCOUNTER — Other Ambulatory Visit (HOSPITAL_COMMUNITY): Payer: Self-pay

## 2023-08-18 ENCOUNTER — Other Ambulatory Visit: Payer: Self-pay

## 2023-08-18 ENCOUNTER — Other Ambulatory Visit: Payer: Self-pay | Admitting: Nurse Practitioner

## 2023-08-18 ENCOUNTER — Other Ambulatory Visit (HOSPITAL_COMMUNITY): Payer: Self-pay

## 2023-08-18 MED ORDER — PANTOPRAZOLE SODIUM 40 MG PO TBEC
40.0000 mg | DELAYED_RELEASE_TABLET | Freq: Every day | ORAL | 0 refills | Status: DC
  Filled 2023-08-18 – 2023-08-20 (×2): qty 30, 30d supply, fill #0

## 2023-08-18 NOTE — Telephone Encounter (Signed)
 Copied from CRM [id number]. Topic: Clinical - Medication Refill >> Aug 18, 2023  9:59 AM Leotis ORN wrote: Most Recent Primary Care Visit:  Provider: ELNOR LAURAINE BRAVO  Department: [facility]  Visit Type: OFFICE VISIT  Date: 07/16/2023  Medication: pantoprazole  (PROTONIX ) 40 MG tablet  Has the patient contacted their pharmacy? Yes (Agent: If no, request that the patient contact the pharmacy for the refill. If patient does not wish to contact the pharmacy document the reason why and proceed with request.) (Agent: If yes, when and what did the pharmacy advise?) call PCP  Is this the correct pharmacy for this prescription? Yes If no, delete pharmacy and type the correct one.  This is the patient's preferred pharmacy:    [facility] [address] [place] [address] Phone: [phone] Fax: [phone]   Has the prescription been filled recently? No  Is the patient out of the medication? Yes  Has the patient been seen for an appointment in the last year OR does the patient have an upcoming appointment? Yes  Can we respond through MyChart? Yes  Agent: Please be advised that Rx refills may take up to 3 business days. We ask that you follow-up with your pharmacy.

## 2023-08-20 ENCOUNTER — Other Ambulatory Visit: Payer: Self-pay

## 2023-08-20 ENCOUNTER — Other Ambulatory Visit (HOSPITAL_BASED_OUTPATIENT_CLINIC_OR_DEPARTMENT_OTHER): Payer: Self-pay

## 2023-08-20 ENCOUNTER — Other Ambulatory Visit (HOSPITAL_COMMUNITY): Payer: Self-pay

## 2023-08-21 ENCOUNTER — Other Ambulatory Visit: Payer: Self-pay

## 2023-08-21 ENCOUNTER — Other Ambulatory Visit (HOSPITAL_COMMUNITY): Payer: Self-pay

## 2023-08-29 ENCOUNTER — Other Ambulatory Visit (HOSPITAL_COMMUNITY): Payer: Self-pay

## 2023-08-31 DIAGNOSIS — Z741 Need for assistance with personal care: Secondary | ICD-10-CM | POA: Diagnosis not present

## 2023-08-31 DIAGNOSIS — Z7409 Other reduced mobility: Secondary | ICD-10-CM | POA: Diagnosis not present

## 2023-08-31 DIAGNOSIS — R3915 Urgency of urination: Secondary | ICD-10-CM | POA: Diagnosis not present

## 2023-09-17 ENCOUNTER — Other Ambulatory Visit: Payer: Self-pay | Admitting: Nurse Practitioner

## 2023-09-17 ENCOUNTER — Telehealth: Payer: Self-pay

## 2023-09-17 DIAGNOSIS — M1990 Unspecified osteoarthritis, unspecified site: Secondary | ICD-10-CM

## 2023-09-17 DIAGNOSIS — M109 Gout, unspecified: Secondary | ICD-10-CM

## 2023-09-17 DIAGNOSIS — E54 Ascorbic acid deficiency: Secondary | ICD-10-CM

## 2023-09-17 DIAGNOSIS — R062 Wheezing: Secondary | ICD-10-CM

## 2023-09-17 DIAGNOSIS — I10 Essential (primary) hypertension: Secondary | ICD-10-CM

## 2023-09-17 DIAGNOSIS — J45909 Unspecified asthma, uncomplicated: Secondary | ICD-10-CM

## 2023-09-17 DIAGNOSIS — M159 Polyosteoarthritis, unspecified: Secondary | ICD-10-CM

## 2023-09-17 NOTE — Telephone Encounter (Signed)
 Copied from CRM [id number]. Topic: Clinical - Medication Refill >> Sep 17, 2023 10:13 AM Sonny Dandy B wrote: Most Recent Primary Care Visit:  Provider: Jiles Prows E  Department: [facility]  Visit Type: OFFICE VISIT  Date: 07/16/2023  Medication:  allopurinol (ZYLOPRIM) 100 MG tablet,albuterol (PROVENTIL) (2.5 MG/3ML) 0.083% nebulizer solution, albuterol (VENTOLIN HFA) 108 (90 Base) MCG/ACT inhaler ,[facility] VITAMIN C 500 MG tablet, Dextromethorphan HBr 10 MG/15ML SYRP, fluticasone (FLONASE) 50 MCG/ACT nasal spray, hydrochlorothiazide (HYDRODIURIL) 25 MG tablet, pantoprazole (PROTONIX) 40 MG tablet  Has the patient contacted their pharmacy? Yes (Agent: If no, request that the patient contact the pharmacy for the refill. If patient does not wish to contact the pharmacy document the reason why and proceed with request.) (Agent: If yes, when and what did the pharmacy advise?)  Is this the correct pharmacy for this prescription? Yes If no, delete pharmacy and type the correct one.  This is the patient's preferred pharmacy:  [facility] [address], [place] - [address] [address] Phone: [phone] Fax: [phone]     Has the prescription been filled recently? Yes  Is the patient out of the medication? Yes  Has the patient been seen for an appointment in the last year OR does the patient have an upcoming appointment? Yes  Can we respond through MyChart? Yes  Agent: Please be advised that Rx refills may take up to 3 business days. We ask that you follow-up with your pharmacy.

## 2023-09-17 NOTE — Telephone Encounter (Signed)
 Copied from CRM [id number]. Topic: Referral - Status >> Sep 17, 2023 10:11 AM Sonny Dandy B wrote: Reason for CRM: Pt called to follow up on the status of her referral for a wheel chair. Pt is in need of a new wheel chair and  can not get around with out it. Pt states her provider put in the request on 07/16/23. Please call pt back with and update.[phone]

## 2023-09-18 ENCOUNTER — Ambulatory Visit: Payer: Medicare HMO | Admitting: Nurse Practitioner

## 2023-09-18 NOTE — Addendum Note (Signed)
 Addended by: Cathleen Fears, Ellenor Wisniewski P on: 09/18/2023 01:46 PM   Modules accepted: Orders

## 2023-09-21 MED ORDER — HYDROCHLOROTHIAZIDE 25 MG PO TABS: 25.0000 mg | ORAL_TABLET | Freq: Every day | ORAL | 1 refills | Status: DC

## 2023-09-21 MED ORDER — ALBUTEROL SULFATE (2.5 MG/3ML) 0.083% IN NEBU: 2.5000 mg | INHALATION_SOLUTION | Freq: Two times a day (BID) | RESPIRATORY_TRACT | 1 refills | Status: DC | PRN

## 2023-09-21 MED ORDER — ALBUTEROL SULFATE HFA 108 (90 BASE) MCG/ACT IN AERS: INHALATION_SPRAY | RESPIRATORY_TRACT | 1 refills | Status: DC

## 2023-09-21 MED ORDER — WHEELCHAIR MISC: 1.0000 | Freq: Every day | 0 refills | Status: AC

## 2023-09-21 MED ORDER — ARNUITY ELLIPTA 50 MCG/ACT IN AEPB: 1.0000 | INHALATION_SPRAY | Freq: Every day | RESPIRATORY_TRACT | 1 refills | Status: AC

## 2023-09-21 MED ORDER — FLUTICASONE PROPIONATE 50 MCG/ACT NA SUSP: 2.0000 | Freq: Every day | NASAL | 3 refills | Status: DC

## 2023-09-21 MED ORDER — PANTOPRAZOLE SODIUM 40 MG PO TBEC: 40.0000 mg | DELAYED_RELEASE_TABLET | Freq: Every day | ORAL | 0 refills | Status: DC

## 2023-09-21 MED ORDER — ASCORBIC ACID 500 MG PO TABS: 500.0000 mg | ORAL_TABLET | Freq: Every day | ORAL | 3 refills | Status: AC

## 2023-09-21 MED ORDER — ALLOPURINOL 100 MG PO TABS: 50.0000 mg | ORAL_TABLET | ORAL | 2 refills | Status: DC

## 2023-09-24 NOTE — Telephone Encounter (Signed)
 Spoke to an [employer] agent, who stated that she is seeing that the order has been uploaded on the 09-21-23. Stated she will be sending a message to the unit that deals with the Power wheel chair.

## 2023-09-24 NOTE — Telephone Encounter (Signed)
 Copied from CRM [id number]. Topic: General - Other >> Sep 24, 2023  1:54 PM Fredrich Romans wrote: Reason for CRM: Daughter called in stating that [employer] said that ey haven't received the wheelchair order that was sent out on 09/18/2023. [employer] wanted to know if it was sent thorough [employer],and if the fax number was correct [phone].If sent through [employer] they would be able to receive it quicker.

## 2023-09-25 DIAGNOSIS — Z7409 Other reduced mobility: Secondary | ICD-10-CM | POA: Diagnosis not present

## 2023-09-25 DIAGNOSIS — R3915 Urgency of urination: Secondary | ICD-10-CM | POA: Diagnosis not present

## 2023-09-25 DIAGNOSIS — Z741 Need for assistance with personal care: Secondary | ICD-10-CM | POA: Diagnosis not present

## 2023-09-28 ENCOUNTER — Ambulatory Visit: Payer: Self-pay | Admitting: Licensed Clinical Social Worker

## 2023-09-28 NOTE — Patient Outreach (Signed)
 Care Coordination   Follow Up Visit Note   09/28/2023 Name: Amy Gallagher MRN: [id number] DOB: 03-Jun-1930  Amy Gallagher is a 89 y.o. year old female who sees Elenore Paddy, NP for primary care. I spoke with  Gus Height / Blake Divine, daughter of client via phone today.  What matters to the patients health and wellness today?  Patient Stated she had some back pain issues. she spoke of wanting to talk with PCP office about help with getting another wheelchair    Goals Addressed             This Visit's Progress    Patient Stated she had some back pain issues. she spoke of wanting to talk with PCP office about help with getting another wheelchair       Interventions:  Spoke with Blake Divine, daughter of client, via phone today about client needs Client uses wheelchair to help her with ambulation.  Client has talked with Elenore Paddy NP about electric wheelchair for client NP has ordered electric wheelchair for client.Gunnar Fusi said that [facility] is working with client related to DME need. Gunnar Fusi said that client is hoping to receive electric wheelchair soon Discussed medication procurement of client Discussed back pain issues of client. Client has said previously to LCSW that she has support from her daughter and from her grandson Client has said previously that her family will help transport her to and from medical appointments. However, her grandson helps her get in and out of vehicles. She uses a wheelchair for ambulation Client has a lift chair to use in the home.  Discussed client medical support with Elenore Paddy NP Used Active Listening techniques to allow client to share her feelings Williams Che, daughter of client, for phone call with LCSW today Encouraged client or her daughter, Gunnar Fusi, to call LCSW as needed for SW support for client at [phone]         SDOH assessments and interventions completed:  Yes  SDOH Interventions Today     Flowsheet Row Most Recent Value  SDOH Interventions   Physical Activity Interventions Other (Comments)  [client uses a wheelchair to help with mobility]  Stress Interventions Other (Comment)  [client has stress in managing medical needs]        Care Coordination Interventions:  Yes, provided    Interventions Today    Flowsheet Row Most Recent Value  Chronic Disease   Chronic disease during today's visit Other  [spoke with Blake Divine, daughter of client, about client needs]  General Interventions   General Interventions Discussed/Reviewed General Interventions Discussed, Community Resources  Education Interventions   Education Provided Provided Education  Provided Verbal Education On [employer]  Mental Health Interventions   Mental Health Discussed/Reviewed Coping Strategies  [no mood issues noted]  Nutrition Interventions   Nutrition Discussed/Reviewed Nutrition Discussed  Pharmacy Interventions   Pharmacy Dicussed/Reviewed Pharmacy Topics Discussed  Safety Interventions   Safety Discussed/Reviewed Fall Risk       Follow up plan: LCSW provided Blake Divine with LCSW name and phone number today. Encouraged Gunnar Fusi or client to call LCSW as needed for SW support for client at [phone]   Encounter Outcome:  Patient Visit Completed    Lorna Few  MSW, LCSW [place] Licensed Clinical Social Worker Direct Dial:  [phone] Fax:  [phone] Website:  [email or web address]

## 2023-09-28 NOTE — Patient Instructions (Signed)
 Visit Information  Thank you for taking time to visit with me today. Please don't hesitate to contact me if I can be of assistance to you.   Following are the goals we discussed today:   Goals Addressed             This Visit's Progress    Patient Stated she had some back pain issues. she spoke of wanting to talk with PCP office about help with getting another wheelchair       Interventions:  Spoke with Blake Divine, daughter of client, via phone today about client needs Client uses wheelchair to help her with ambulation.  Client has talked with Elenore Paddy NP about electric wheelchair for client NP has ordered electric wheelchair for client.Gunnar Fusi said that [facility] is working with client related to DME need. Gunnar Fusi said that client is hoping to receive electric wheelchair soon Discussed medication procurement of client Discussed back pain issues of client. Client has said previously to LCSW that she has support from her daughter and from her grandson Client has said previously that her family will help transport her to and from medical appointments. However, her grandson helps her get in and out of vehicles. She uses a wheelchair for ambulation Client has a lift chair to use in the home.  Discussed client medical support with Elenore Paddy NP Used Active Listening techniques to allow client to share her feelings Williams Che, daughter of client, for phone call with LCSW today Encouraged client or her daughter, Gunnar Fusi, to call LCSW as needed for SW support for client at [phone]        LCSW gave Blake Divine LCSW name and phone number today. LCSW encouraged Blake Divine or client to call LCSW as needed for SW support for client at [phone]  Please call the care guide team at [phone] if you need to cancel or reschedule your appointment.   If you are experiencing a Mental Health or Behavioral Health Crisis or need someone to talk to, please go to  [facility] [address], [place] [phone])   The patient / Blake Divine, daughter, verbalized understanding of instructions, educational materials, and care plan provided today and DECLINED offer to receive copy of patient instructions, educational materials, and care plan.   The patient/ Blake Divine, daughter, has been provided with contact information for the care management team and has been advised to call with any health related questions or concerns.    Lorna Few  MSW, LCSW [place] Licensed Clinical Social Worker Direct Dial:  [phone] Fax:  [phone] Website:  [email or web address]

## 2023-09-29 NOTE — Telephone Encounter (Signed)
 Patient is needing notes for her to get the wheelchair [facility] need the office notes today so she can get her wheel chair.

## 2023-09-30 ENCOUNTER — Telehealth: Payer: Self-pay

## 2023-09-30 DIAGNOSIS — M25571 Pain in right ankle and joints of right foot: Secondary | ICD-10-CM

## 2023-09-30 DIAGNOSIS — M1990 Unspecified osteoarthritis, unspecified site: Secondary | ICD-10-CM

## 2023-09-30 DIAGNOSIS — M109 Gout, unspecified: Secondary | ICD-10-CM

## 2023-09-30 NOTE — Telephone Encounter (Signed)
 Spoke to to [facility] they stating the order is put in place and need an office notes, I explain to them that is was faxed already. They stated they did not have so, printed office note and fax to [phone]

## 2023-09-30 NOTE — Telephone Encounter (Signed)
 Copied from CRM [id number]. Topic: General - Other >> Sep 30, 2023  2:25 PM Kathryne Eriksson wrote: Reason for CRM: Wheelchair Status Update >> Sep 30, 2023  2:28 PM Kathryne Eriksson wrote: Gunnar Fusi "Daughter" [phone] Called on behalf of not hearing anything back in regards to her mother receiving her wheelchair. Claims they've been waiting for almost a month now with no update and wants to know what's going on. Gunnar Fusi states they're very unhappy with the way this is being handled and is questioning rather or not they need to change doctors.

## 2023-09-30 NOTE — Telephone Encounter (Signed)
 Copied from CRM [id number]. Topic: General - Other >> Sep 24, 2023  1:54 PM Fredrich Romans wrote: Reason for CRM: Daughter called in stating that [facility] said that ey haven't received the wheelchair order that was sent out on 09/18/2023. [facility] wanted to know if it was sent thorough [facility],and if the fax number was correct [phone].If sent through [employer] they would be able to receive it quicker. >> Sep 30, 2023  9:21 AM Shelbie Proctor wrote: Patient's child Learta Codding [phone] checking on wheelchair order to [employer], it's been about 3 weeks and patient needs the wheelchair. Learta Codding is upset and wants to speak with someone on this matter. Please call back.

## 2023-09-30 NOTE — Addendum Note (Signed)
 Addended by: Cathleen Fears, Nayab Aten P on: 09/30/2023 04:19 PM   Modules accepted: Orders

## 2023-09-30 NOTE — Telephone Encounter (Addendum)
 Spoke to pt daughter and let her be aware of this issue. I explain it can be send to another place call [facility], daughter is fine with it.  order and forms faxed to [facility] at [phone]

## 2023-10-01 ENCOUNTER — Other Ambulatory Visit (HOSPITAL_COMMUNITY): Payer: Self-pay

## 2023-10-05 NOTE — Telephone Encounter (Signed)
 Called pt daughter and explain, at the moment we will have to wait of [facility] since the [facility] dose not accept her mother(pt ) insurance. Also, told her to call the insurance to see in there is any other place to send it to

## 2023-10-05 NOTE — Telephone Encounter (Signed)
 Copied from CRM [id number]. Topic: Clinical - Prescription Issue >> Oct 05, 2023  9:34 AM Florestine Avers wrote: Reason for CRM: Patients daughter called on behalf of the patient stating that NP Jiles Prows wrote a prescription for a wheel chair, but they do not accept her insurance at the [facility]. She wants to know what she should do now and is requesting a call back. Patients daughter is asking to call her instead because the patient will ignore the call she can be reached at [phone].

## 2023-10-07 NOTE — Telephone Encounter (Signed)
 Copied from CRM [id number]. Topic: General - Other >> Oct 07, 2023  1:28 PM Aisha D wrote: Reason for CRM: Patient's daughter called in and stated that [facility] stated that the insurance the patient has will not cover the wheelchair.Daughter wants to speak with the nurse to see what other options they have or if they found another company that accepts the insurance for the wheelchair.

## 2023-10-09 ENCOUNTER — Ambulatory Visit: Admitting: Nurse Practitioner

## 2023-10-13 NOTE — Telephone Encounter (Signed)
 Copied from CRM [id number]. Topic: Referral - Status >> Oct 13, 2023  8:52 AM Desma Mcgregor wrote: Reason for CRM: Daughter Gunnar Fusi called about the status of the referral for the wheelchair. I went over the notes with her about speaking with the insurance to see what or where they would cover the wheelchair. She will try again, but has another place been located or is there any updated information that can be provided to her at this time? She says this has been going on for about a month now. Please follow up.

## 2023-10-14 NOTE — Telephone Encounter (Signed)
 Patient insurance company [employer]   [phone]  [facility] [phone]          address [phone] [place] [address] they also need a order for the wheelchair fax number for [phone]

## 2023-10-15 ENCOUNTER — Telehealth: Payer: Self-pay | Admitting: Nurse Practitioner

## 2023-10-15 NOTE — Telephone Encounter (Unsigned)
 Copied from CRM [id number]. Topic: General - Call Back - No Documentation >> Oct 15, 2023 12:20 PM Shereese L wrote: Reason for CRM: patient daughter would like a call back in reference to her mother receiving her wheel chair. Call back # [phone]

## 2023-10-20 NOTE — Telephone Encounter (Signed)
 Copied from CRM [id number]. Topic: General - Other >> Oct 20, 2023 10:41 AM Deaijah H wrote: Reason for CRM: Patient's daughter called in stating [employer] didn't receive a fax yesterday for wheelchair order. Would like to have Dr. Wallace Cullens to fax order over directly herself today. Fax number [phone]. If needed, please call  [phone] (daughters cell)

## 2023-10-20 NOTE — Telephone Encounter (Signed)
 Copied from CRM [id number]. Topic: Clinical - Prescription Issue >> Oct 20, 2023 10:22 AM Adaysia C wrote: Reason for CRM: Patients daughter(Sunny) called to check on the status of the order for the patients new wheelchair; I contacted [employer] to see if there were any updates on the order because no updates were showing in the patients chart; [employer] informed me that they sent in the new order information to [employer] [phone] on 10/19/2023 and are waiting for a response; I notified the patients daughter(Sunny) of this update; Please follow up with patients daughter Sunny#[phone] with any new updates

## 2023-10-22 ENCOUNTER — Telehealth: Payer: Self-pay | Admitting: Nurse Practitioner

## 2023-10-22 NOTE — Telephone Encounter (Signed)
 Pt daughter called stating she has been going back and fourth about getting a power wheel chair since January. Pt daughter stated the order suppose to be sent over to [employer] for wheelchair to see if it get approve pt has [employer] for insurance.The daughter said she will like to speak with management about this issue. Please advise.  Best call back [phone] [facility]

## 2023-10-23 NOTE — Telephone Encounter (Signed)
 Spoke with patient daughter, no DPR on file but patient with her and approved okay to speak with [place].   Informed daughter we have sent order 3 times (07/16/2023; 09/18/2023; 09/30/2023). Would find out more and call her back.   Called DME, [employer] [facility] [phone] spoke with Ocie Cornfield she stated they do not have orders for patients electric wheelchair. Verified fax numbers [phone]; [phone]) and what is needed (Order and Last OV with mention of need for electric wheelchair).   Sent information to [employer], both fax numbers, faxes confirmed went through. Called daughter Learta Codding back and made aware.

## 2023-10-28 DIAGNOSIS — Z7409 Other reduced mobility: Secondary | ICD-10-CM | POA: Diagnosis not present

## 2023-10-28 DIAGNOSIS — Z741 Need for assistance with personal care: Secondary | ICD-10-CM | POA: Diagnosis not present

## 2023-10-28 DIAGNOSIS — R3915 Urgency of urination: Secondary | ICD-10-CM | POA: Diagnosis not present

## 2023-11-02 NOTE — Telephone Encounter (Signed)
 Issue was address in different encounter

## 2023-11-15 ENCOUNTER — Other Ambulatory Visit: Payer: Self-pay | Admitting: Nurse Practitioner

## 2023-11-26 DIAGNOSIS — Z741 Need for assistance with personal care: Secondary | ICD-10-CM | POA: Diagnosis not present

## 2023-11-26 DIAGNOSIS — R3915 Urgency of urination: Secondary | ICD-10-CM | POA: Diagnosis not present

## 2023-11-26 DIAGNOSIS — Z7409 Other reduced mobility: Secondary | ICD-10-CM | POA: Diagnosis not present

## 2023-11-30 ENCOUNTER — Telehealth: Payer: Self-pay

## 2023-11-30 NOTE — Telephone Encounter (Signed)
 Copied from CRM [id number]. Topic: General - Other >> Nov 30, 2023  2:52 PM Sophia H wrote: Reason for CRM: Spoke with Trevor Fudge with [facility] who was calling to see if a prior authorization was submitted directly to them for the patients electric wheel chair. Looks like we sent over orders to the DME company directly, Trevor Fudge states she needs us  to send over that auth directly to ins to get this taken care of for the patient.   For all wheelchairs/power devices for pt - have to go through [facility] - [phone]   FAX # for [facility] [employer] [employer] [phone]

## 2023-12-06 ENCOUNTER — Other Ambulatory Visit: Payer: Self-pay | Admitting: Nurse Practitioner

## 2023-12-06 DIAGNOSIS — R062 Wheezing: Secondary | ICD-10-CM

## 2023-12-06 DIAGNOSIS — J45909 Unspecified asthma, uncomplicated: Secondary | ICD-10-CM

## 2023-12-22 ENCOUNTER — Ambulatory Visit (INDEPENDENT_AMBULATORY_CARE_PROVIDER_SITE_OTHER): Payer: Medicare HMO

## 2023-12-22 VITALS — Ht 62.0 in | Wt 179.0 lb

## 2023-12-22 DIAGNOSIS — H9193 Unspecified hearing loss, bilateral: Secondary | ICD-10-CM | POA: Diagnosis not present

## 2023-12-22 DIAGNOSIS — Z0000 Encounter for general adult medical examination without abnormal findings: Secondary | ICD-10-CM | POA: Diagnosis not present

## 2023-12-22 DIAGNOSIS — Z558 Other problems related to education and literacy: Secondary | ICD-10-CM | POA: Diagnosis not present

## 2023-12-22 DIAGNOSIS — Z5982 Transportation insecurity: Secondary | ICD-10-CM | POA: Diagnosis not present

## 2023-12-22 DIAGNOSIS — Z0100 Encounter for examination of eyes and vision without abnormal findings: Secondary | ICD-10-CM

## 2023-12-22 NOTE — Progress Notes (Signed)
 Subjective:  Please attest and cosign this visit due to patients primary care provider not being in the office at the time the visit was completed.  (Pt of Amy Agee, NP)   Amy Gallagher is a 89 y.o. who presents for a Medicare Wellness preventive visit.  As a reminder, Annual Wellness Visits don't include a physical exam, and some assessments may be limited, especially if this visit is performed virtually. We may recommend an in-person follow-up visit with your provider if needed.  Visit Complete: Virtual I connected with  [employer] on 12/22/23 by a audio enabled telemedicine application and verified that I am speaking with the correct person using two identifiers.  Patient Location: Home  Provider Location: Office/Clinic  I discussed the limitations of evaluation and management by telemedicine. The patient expressed understanding and agreed to proceed.  Vital Signs: Because this visit was a virtual/telehealth visit, some criteria may be missing or patient reported. Any vitals not documented were not able to be obtained and vitals that have been documented are patient reported.  VideoDeclined- This patient declined Librarian, academic. Therefore the visit was completed with audio only.  Persons Participating in Visit: Patient.  AWV Questionnaire: No: Patient Medicare AWV questionnaire was not completed prior to this visit.  Cardiac Risk Factors include: advanced age (>109men, >70 women);hypertension;obesity (BMI >30kg/m2)     Objective:     Today's Vitals   12/22/23 1349  Weight: 179 lb (81.2 kg)  Height: 5\' 2"  (1.575 m)   Body mass index is 32.74 kg/m.     12/22/2023    1:48 PM 12/17/2022   11:36 AM 10/10/2021   12:51 PM 06/24/2019    2:10 PM  Advanced Directives  Does Patient Have a Medical Advance Directive? No No No Yes  Type of Theme park manager;Living will  Would patient like information on  creating a medical advance directive? No - Patient declined No - Patient declined No - Patient declined     Current Medications (verified) Outpatient Encounter Medications as of 12/22/2023  Medication Sig   albuterol  (PROVENTIL ) (2.5 MG/3ML) 0.083% nebulizer solution TAKE 3 MLS (2.5 MG TOTAL) BY NEBULIZATION 2 (TWO) TIMES DAILY AS NEEDED FOR WHEEZING OR SHORTNESS OF BREATH.   albuterol  (VENTOLIN  HFA) 108 (90 Base) MCG/ACT inhaler INHALE 2 PUFFS BY MOUTH EVERY 4 HOURS AS NEEDED FOR WHEEZE OR FOR SHORTNESS OF BREATH   allopurinol  (ZYLOPRIM ) 100 MG tablet Take 0.5 tablets (50 mg total) by mouth every other day.   ascorbic acid  ([employer] VITAMIN C ) 500 MG tablet Take 1 tablet (500 mg total) by mouth daily.   Dextromethorphan  HBr 10 MG/15ML SYRP Take 15 mLs (10 mg total) by mouth daily.   fluticasone  (FLONASE ) 50 MCG/ACT nasal spray Place 2 sprays into both nostrils daily.   Fluticasone  Furoate (ARNUITY ELLIPTA ) 50 MCG/ACT AEPB Inhale 1 puff into the lungs daily.   hydrochlorothiazide  (HYDRODIURIL ) 25 MG tablet Take 1 tablet (25 mg total) by mouth daily.   Misc. Devices (COMMODE) MISC Please dispense one rail for commode. ICD10 Z74.09   Misc. Devices [facility]) MISC 1 Device by Does not apply route daily.   Multiple Vitamin (MULTIVITAMIN WITH MINERALS) TABS tablet Take 1 tablet by mouth daily.   pantoprazole  (PROTONIX ) 40 MG tablet TAKE 1 TABLET BY MOUTH EVERY DAY   No facility-administered encounter medications on file as of 12/22/2023.    Allergies (verified) Aspirin and Penicillins   History: Past Medical  History:  Diagnosis Date   Arthritis    Bronchitis    Gout    Past Surgical History:  Procedure Laterality Date   SALPINGECTOMY     right   History reviewed. No pertinent family history. Social History   Socioeconomic History   Marital status: Widowed    Spouse name: Not on file   Number of children: Not on file   Years of education: Not on file   Highest education level: Not  on file  Occupational History   Not on file  Tobacco Use   Smoking status: Former    Current packs/day: 0.00    Types: Cigarettes    Quit date: 02/26/1997    Years since quitting: 26.8   Smokeless tobacco: Never  Substance and Sexual Activity   Alcohol use: No   Drug use: No   Sexual activity: Not Currently  Other Topics Concern   Not on file  Social History Narrative   Patient cares for her mentally 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    Walks with a rolling walker.    Social Drivers of Corporate investment banker Strain: Low Risk  (12/22/2023)   Overall Financial Resource Strain (CARDIA)    Difficulty of Paying Living Expenses: Not hard at all  Food Insecurity: No Food Insecurity (12/22/2023)   Hunger Vital Sign    Worried About Running Out of Food in the Last Year: Never true    Ran Out of Food in the Last Year: Never true  Transportation Needs: Unmet Transportation Needs (12/22/2023)   PRAPARE - Administrator, Civil Service (Medical): Yes    Lack of Transportation (Non-Medical): Yes  Physical Activity: Inactive (12/22/2023)   Exercise Vital Sign    Days of Exercise per Week: 0 days    Minutes of Exercise per Session: 0 min  Stress: No Stress Concern Present (12/22/2023)   [employer] of Occupational Health - Occupational Stress Questionnaire    Feeling of Stress : Only a little  Recent Concern: Stress - Stress Concern Present (09/28/2023)   [employer] of Occupational Health - Occupational Stress Questionnaire    Feeling of Stress : To some extent  Social Connections: Socially Isolated (12/22/2023)   Social Connection and Isolation Panel [NHANES]    Frequency of Communication with Friends and Family: More than three times a week    Frequency of Social Gatherings with Friends and Family: Twice a week    Attends Religious Services: Never    Database administrator or Organizations: No    Attends Banker  Meetings: Never    Marital Status: Widowed    Tobacco Counseling Counseling given: No    Clinical Intake:  Pre-visit preparation completed: Yes  Pain : No/denies pain     BMI - recorded: 32.74 Nutritional Status: BMI > 30  Obese Nutritional Risks: None Diabetes: No  Lab Results  Component Value Date   HGBA1C 5.6 02/24/2013   HGBA1C 6.1 (H) 02/27/2012     How often do you need to have someone help you when you read instructions, pamphlets, or other written materials from your doctor or pharmacy?: 1 - Never  Interpreter Needed?: No  Information entered by :: Kandy Orris, CMA   Activities of Daily Living     12/22/2023    1:52 PM  In your present state of health, do you have any difficulty performing the following activities:  Hearing? 0  Vision? 0  Difficulty concentrating or making decisions? 0  Walking or climbing stairs? 0  Dressing or bathing? 0  Doing errands, shopping? 0  Preparing Food and eating ? N  Using the Toilet? N  In the past six months, have you accidently leaked urine? Y  Comment wears a depend  Do you have problems with loss of bowel control? N  Managing your Medications? N  Managing your Finances? N  Housekeeping or managing your Housekeeping? N    Patient Care Team: Zorita Hiss, NP as PCP - General (Nurse Practitioner)  I have updated your Care Teams any recent Medical Services you may have received from other providers in the past year.     Assessment:    This is a routine wellness examination for Alicha.  Hearing/Vision screen Hearing Screening - Comments:: Referral to see an Audiologist - c/o difficulty hearing at times Vision Screening - Comments:: Wears eyeglasses - referral to an Opthalmologist    Goals Addressed               This Visit's Progress     Patient Stated (pt-stated)        Patient stated she's okay but waiting for her wheelchair to be delivered but trying to stay active when can        Depression Screen     12/22/2023    1:59 PM 07/16/2023    3:24 PM 12/17/2022   11:41 AM 12/05/2022    2:54 PM 10/10/2021   12:52 PM 10/10/2021   12:49 PM 08/21/2021   10:19 AM  PHQ 2/9 Scores  PHQ - 2 Score 3 0 0 0 0 0 0  PHQ- 9 Score 9  0    0    Fall Risk     12/22/2023    1:56 PM 07/16/2023    3:24 PM 12/17/2022   11:43 AM 12/05/2022    2:54 PM 02/12/2022    9:30 AM  Fall Risk   Falls in the past year? 0 0 0 0 0  Number falls in past yr: 0 0 0 0 0  Injury with Fall? 0 0 0 0 0  Risk for fall due to : Impaired balance/gait;Impaired mobility No Fall Risks History of fall(s);Impaired balance/gait;Impaired mobility;Orthopedic patient History of fall(s) No Fall Risks  Risk for fall due to: Comment uses a Youth worker and wheelchair      Follow up Falls evaluation completed;Falls prevention discussed Falls evaluation completed Falls prevention discussed Falls evaluation completed Falls evaluation completed    MEDICARE RISK AT HOME:  Medicare Risk at Home Any stairs in or around the home?: No If so, are there any without handrails?: No Home free of loose throw rugs in walkways, pet beds, electrical cords, etc?: Yes Adequate lighting in your home to reduce risk of falls?: Yes Life alert?: No Use of a cane, walker or w/c?: Yes (cane/walker/wheelchair) Grab bars in the bathroom?: No Shower chair or bench in shower?: No Elevated toilet seat or a handicapped toilet?: No  TIMED UP AND GO:  Was the test performed?  No  Cognitive Function: 6CIT completed        12/22/2023    2:03 PM 12/17/2022   11:45 AM  6CIT Screen  What Year? 0 points 0 points  What month? 0 points 0 points  What time? 0 points 0 points  Count back from 20 0 points 0 points  Months in reverse 0 points 0 points  Repeat phrase 0 points 0  points  Total Score 0 points 0 points    Immunizations Immunization History  Administered Date(s) Administered   Influenza, High Dose Seasonal PF 03/23/2018   Influenza,inj,Quad  PF,6+ Mos 06/10/2016, 04/27/2017   Moderna SARS-COV2 Booster Vaccination 08/22/2020, 01/21/2021   Moderna Sars-Covid-2 Vaccination 01/25/2020, 02/22/2020   Pneumococcal Conjugate-13 11/08/2015   Pneumococcal Polysaccharide-23 02/12/1999   Td 05/18/2003    Screening Tests Health Maintenance  Topic Date Due   Zoster Vaccines- Shingrix (1 of 2) Never done   DTaP/Tdap/Td (2 - Tdap) 05/17/2013   COVID-19 Vaccine (5 - 2024-25 season) 03/15/2023   DEXA SCAN  12/21/2024 (Originally 10/31/1996)   INFLUENZA VACCINE  02/12/2024   Medicare Annual Wellness (AWV)  12/21/2024   Pneumonia Vaccine 75+ Years old  Completed   HPV VACCINES  Aged Out   Meningococcal B Vaccine  Aged Out    Health Maintenance  Health Maintenance Due  Topic Date Due   Zoster Vaccines- Shingrix (1 of 2) Never done   DTaP/Tdap/Td (2 - Tdap) 05/17/2013   COVID-19 Vaccine (5 - 2024-25 season) 03/15/2023   Health Maintenance Items Addressed:  Referral sent to Optometry/Ophthalmology - referral to Dr Carloyn [facility] of [facility] Referral sent to an Audiologist - pt c/o difficulty hearing   Additional Screening:  Vision Screening: Recommended annual ophthalmology exams for early detection of glaucoma and other disorders of the eye. Would you like a referral to an eye doctor? Yes - referral to [facility]   Dental Screening: Recommended annual dental exams for proper oral hygiene  Community Resource Referral / Chronic Care Management: CRR required this visit?  Referral for SDOH - needing assistance with Transportation and safety devices (grab bars, shower chair, handicap toilet, etc).  CCM required this visit?  No   Plan:    I have personally reviewed and noted the following in the patient's chart:   Medical and social history Use of alcohol, tobacco or illicit drugs  Current medications and supplements including opioid prescriptions. Patient is not currently taking opioid prescriptions. Functional ability and  status Nutritional status Physical activity Advanced directives List of other physicians Hospitalizations, surgeries, and ER visits in previous 12 months Vitals Screenings to include cognitive, depression, and falls Referrals and appointments  In addition, I have reviewed and discussed with patient certain preventive protocols, quality metrics, and best practice recommendations. A written personalized care plan for preventive services as well as general preventive health recommendations were provided to patient.   Patria Bookbinder, CMA   12/22/2023   After Visit Summary: (Mail) Due to this being a telephonic visit, the after visit summary with patients personalized plan was offered to patient via mail   Notes: Nothing significant to report at this time.

## 2023-12-22 NOTE — Patient Instructions (Addendum)
 Ms. Amy Gallagher , Thank you for taking time out of your busy schedule to complete your Annual Wellness Visit with me. I enjoyed our conversation and look forward to speaking with you again next year. I, as well as your care team,  appreciate your ongoing commitment to your health goals. Please review the following plan we discussed and let me know if I can assist you in the future. Your Game plan/ To Do List    Referrals: If you haven't heard from the office you've been referred to, please reach out to them at the phone provided.  Referral to Audiologist for an ear exam, an Ophthalmologist (Dr [facility]n [facility] of [facility]) and help with resources for Transportation and safety resources Follow up Visits: Next Medicare AWV with our clinical staff: 12/26/2024   Have you seen your provider in the last 6 months (3 months if uncontrolled diabetes)? No Next Office Visit with your provider: 04/07/2024  Clinician Recommendations:  Aim for 30 minutes of exercise or brisk walking, 6-8 glasses of water, and 5 servings of fruits and vegetables each day. Educated and advised on getting Tdap (Tetenus) and Shingles vaccines at [facility].      This is a list of the screening recommended for you and due dates:  Health Maintenance  Topic Date Due   Zoster (Shingles) Vaccine (1 of 2) Never done   DTaP/Tdap/Td vaccine (2 - Tdap) 05/17/2013   COVID-19 Vaccine (5 - 2024-25 season) 03/15/2023   DEXA scan (bone density measurement)  12/21/2024*   Flu Shot  02/12/2024   Medicare Annual Wellness Visit  12/21/2024   Pneumonia Vaccine  Completed   HPV Vaccine  Aged Out   Meningitis B Vaccine  Aged Out  *Topic was postponed. The date shown is not the original due date.    Advanced directives: (Declined) Advance directive discussed with you today. Even though you declined this today, please call our office should you change your mind, and we can give you the proper paperwork for you to fill out. Advance Care Planning is  important because it:  [x]  Makes sure you receive the medical care that is consistent with your values, goals, and preferences  [x]  It provides guidance to your family and loved ones and reduces their decisional burden about whether or not they are making the right decisions based on your wishes.  Follow the link provided in your after visit summary or read over the paperwork we have mailed to you to help you started getting your Advance Directives in place. If you need assistance in completing these, please reach out to us  so that we can help you!

## 2023-12-28 DIAGNOSIS — Z7409 Other reduced mobility: Secondary | ICD-10-CM | POA: Diagnosis not present

## 2023-12-28 DIAGNOSIS — R3915 Urgency of urination: Secondary | ICD-10-CM | POA: Diagnosis not present

## 2023-12-28 DIAGNOSIS — Z741 Need for assistance with personal care: Secondary | ICD-10-CM | POA: Diagnosis not present

## 2023-12-30 ENCOUNTER — Telehealth: Payer: Self-pay | Admitting: *Deleted

## 2023-12-30 NOTE — Progress Notes (Signed)
 Complex Care Management Note Care Guide Note  12/30/2023 Name: Amy Gallagher MRN: [id number] DOB: 1930/04/29  Amy Gallagher is a 89 y.o. year old female who is a primary care patient of Zorita Hiss, NP . The community resource team was consulted for assistance with Transportation Needs   SDOH screenings and interventions completed:  Yes     SDOH Interventions Today    Flowsheet Row Most Recent Value  SDOH Interventions   Transportation Interventions Community Resources Provided, SCAT (Specialized Community Area Transporation), Payor Benefit  [Has medicaid transportation also will mail her application for [facility]]     Care guide performed the following interventions: Patient provided with information about care guide support team and interviewed to confirm resource needs.  Follow Up Plan:  No further follow up planned at this time. The patient has been provided with needed resources.  Encounter Outcome:  Patient Visit Completed Jasimine Simms [place]auer[place]Moran  [facility] HealthPopulation Health Care Guide  Direct [phone] Fax:[phone] Website: [email or web address]

## 2023-12-31 ENCOUNTER — Telehealth: Payer: Self-pay | Admitting: *Deleted

## 2023-12-31 NOTE — Progress Notes (Signed)
 Complex Care Management Note Care Guide Note  12/31/2023 Name: Amy Gallagher MRN: [id number] DOB: 07-28-1929   Complex Care Management Outreach Attempts: An unsuccessful telephone outreach was attempted today to offer the patient information about available complex care management services.  Follow Up Plan:  No further outreach attempts will be made at this time. We have been unable to contact the patient to offer or enroll patient in complex care management services.  Encounter Outcome:  contact info given - pt says will call back if services needed.   Kandis Ormond, CMA\ [place]  [facility], [facility] Guide Direct Dial: [phone]  Fax: [phone] Website: [email or web address]

## 2024-01-12 ENCOUNTER — Other Ambulatory Visit: Payer: Self-pay | Admitting: Nurse Practitioner

## 2024-01-12 DIAGNOSIS — M109 Gout, unspecified: Secondary | ICD-10-CM

## 2024-01-27 DIAGNOSIS — R3915 Urgency of urination: Secondary | ICD-10-CM | POA: Diagnosis not present

## 2024-01-27 DIAGNOSIS — Z7409 Other reduced mobility: Secondary | ICD-10-CM | POA: Diagnosis not present

## 2024-01-27 DIAGNOSIS — Z741 Need for assistance with personal care: Secondary | ICD-10-CM | POA: Diagnosis not present

## 2024-02-22 ENCOUNTER — Telehealth: Payer: Self-pay

## 2024-02-22 NOTE — Telephone Encounter (Signed)
 Copied from CRM #[id number]. Topic: Clinical - Order For Equipment >> Feb 22, 2024 10:25 AM [name] wrote: Reason for CRM: Patient's insurance is willing to cover the cost of a wheelchair she is needing. The order was entered in January by her provider but she never received anything.  She spoke with the supplier ([employer]) this Friday and would like if we could reach out to them or her insurance company about what specifically they're needing in order to provide a wheelchair to the patient.

## 2024-02-26 ENCOUNTER — Ambulatory Visit: Payer: Self-pay | Admitting: *Deleted

## 2024-02-26 DIAGNOSIS — Z7409 Other reduced mobility: Secondary | ICD-10-CM | POA: Diagnosis not present

## 2024-02-26 DIAGNOSIS — R3915 Urgency of urination: Secondary | ICD-10-CM | POA: Diagnosis not present

## 2024-02-26 DIAGNOSIS — Z741 Need for assistance with personal care: Secondary | ICD-10-CM | POA: Diagnosis not present

## 2024-02-26 NOTE — Telephone Encounter (Signed)
 Copied from CRM #[id number]. Topic: Clinical - Order For Equipment >> Feb 26, 2024 11:03 AM Jayma L wrote: Reason for CRM: patient called in asking for a update on the wheelchair , said this is needed and she never heard anything back. Please call her back and advise

## 2024-02-26 NOTE — Telephone Encounter (Signed)
 Patient called to follow up on the order and would like a call back from [place]. Best callback is [phone].

## 2024-02-26 NOTE — Telephone Encounter (Signed)
 FYI Only or Action Required?: FYI only for provider.  Patient was last seen in primary care on 07/16/2023 by Elnor Lauraine BRAVO, NP.  Called Nurse Triage reporting Leg Problem.  Symptoms began several weeks ago.  Interventions attempted: Nothing.  Symptoms are: gradually worsening.  Triage Disposition: See PCP When Office is Open (Within 3 Days)  Patient/caregiver understands and will follow disposition?: Patient has been scheduled to discuss her increased pain, fatigue. Call to [facility] regarding wheelchair and she will follow up with clinic manage regarding order for this since if has been pending since March.  Reason for Disposition  [1] MODERATE pain (e.g., interferes with normal activities, limping) AND [2] present > 3 days  Answer Assessment - Initial Assessment Questions 1. ONSET: When did the pain start?      Patient has been waiting for wheelchair(March)- patient states she is fatigued, and she is having pain in her legs 2. LOCATION: Where is the pain located?      Muscle pain- patient exercises in the bed 3. PAIN: How bad is the pain?    (Scale 1-10; or mild, moderate, severe)     Sitting in chair makes her legs numb- hard to move in chair  5. CAUSE: What do you think is causing the leg pain?     Sitting in old chair-without proper support- patient has purchased 2 pillows 6. OTHER SYMPTOMS: Do you have any other symptoms? (e.g., chest pain, back pain, breathing difficulty, swelling, rash, fever, numbness, weakness)     Fatigue, patient is unable to stand- her legs are not stable- she is frustrated about her inability to move  Protocols used: Leg Pain-A-AH   Copied from CRM #[id number]. Topic: Clinical - Red Word Triage >> Feb 26, 2024 11:08 AM Jayma L wrote: Red Word that prompted transfer to Nurse Triage:   Patient called in asking for a update on a new wheel chair, she said her legs are going numb and getting worse in her legs. The chair she has is worn out

## 2024-02-29 ENCOUNTER — Ambulatory Visit: Admitting: Family Medicine

## 2024-02-29 NOTE — Progress Notes (Deleted)
   Acute Office Visit  Subjective:     Patient ID: Amy Gallagher, female    DOB: [birth year], 89 y.o.   MRN: [id number]  No chief complaint on file.   HPI  Discussed the use of AI scribe software for clinical note transcription with the patient, who gave verbal consent to proceed.  History of Present Illness      ROS Per HPI      Objective:    There were no vitals taken for this visit.   Physical Exam Vitals and nursing note reviewed.  Constitutional:      General: She is not in acute distress.    Appearance: Normal appearance. She is normal weight.  HENT:     Head: Normocephalic and atraumatic.     Right Ear: External ear normal.     Left Ear: External ear normal.     Nose: Nose normal.     Mouth/Throat:     Mouth: Mucous membranes are moist.     Pharynx: Oropharynx is clear.  Eyes:     Extraocular Movements: Extraocular movements intact.     Pupils: Pupils are equal, round, and reactive to light.  Cardiovascular:     Rate and Rhythm: Normal rate and regular rhythm.     Pulses: Normal pulses.     Heart sounds: Normal heart sounds.  Pulmonary:     Effort: Pulmonary effort is normal. No respiratory distress.     Breath sounds: Normal breath sounds. No wheezing, rhonchi or rales.  Musculoskeletal:        General: Normal range of motion.     Cervical back: Normal range of motion.     Right lower leg: No edema.     Left lower leg: No edema.  Lymphadenopathy:     Cervical: No cervical adenopathy.  Neurological:     General: No focal deficit present.     Mental Status: She is alert and oriented to person, place, and time.  Psychiatric:        Mood and Affect: Mood normal.        Thought Content: Thought content normal.     No results found for any visits on 02/29/24.      Assessment & Plan:   Assessment and Plan Assessment & Plan      No orders of the defined types were placed in this encounter.    No orders of the defined types were  placed in this encounter.   No follow-ups on file.  Corean LITTIE Ku, FNP

## 2024-03-04 NOTE — Telephone Encounter (Signed)
 Copied from CRM #[id number]. Topic: Clinical - Order For Equipment >> Mar 04, 2024 12:14 PM Burnard DEL wrote: Reason for CRM: Patient really would like an update as to what is going on with her wheelchair order form January. Patient stated that she isnt able to come to her appointments because she can't walk and needs her wheelchair. She has not heard any updates from anyone.Could patient please be contacted today with an update.

## 2024-03-07 NOTE — Telephone Encounter (Signed)
 Called and unable to leave voice mail as it keeps ringing.

## 2024-03-24 ENCOUNTER — Other Ambulatory Visit: Payer: Self-pay | Admitting: Nurse Practitioner

## 2024-03-25 DIAGNOSIS — Z741 Need for assistance with personal care: Secondary | ICD-10-CM | POA: Diagnosis not present

## 2024-03-25 DIAGNOSIS — R3915 Urgency of urination: Secondary | ICD-10-CM | POA: Diagnosis not present

## 2024-03-25 DIAGNOSIS — Z7409 Other reduced mobility: Secondary | ICD-10-CM | POA: Diagnosis not present

## 2024-04-06 ENCOUNTER — Telehealth: Payer: Self-pay | Admitting: Radiology

## 2024-04-06 NOTE — Telephone Encounter (Signed)
 Copied from CRM #[id number]. Topic: General - Other >> Apr 06, 2024 11:53 AM Macario HERO wrote: Reason for CRM: Patient called regarding request for a wheelchair. Ronnald called her last month. Called [employer] and was advised they are going to lunch and to send a CRM.

## 2024-04-07 ENCOUNTER — Ambulatory Visit: Admitting: Nurse Practitioner

## 2024-04-13 ENCOUNTER — Ambulatory Visit: Admitting: Podiatry

## 2024-04-14 ENCOUNTER — Telehealth: Payer: Self-pay

## 2024-04-14 NOTE — Telephone Encounter (Signed)
 Copied from CRM #[id number]. Topic: General - Other >> Apr 06, 2024 11:53 AM Macario HERO wrote: Reason for CRM: Patient called regarding request for a wheelchair. Ronnald called her last month. Called [employer] and was advised they are going to lunch and to send a CRM. >> Apr 13, 2024  3:38 PM Rosina BIRCH wrote: Angeline a nurse case manager from [place] would like to be called regarding a DME order for a power wheel chair for the patient [phone]  [phone] [phone] option 3

## 2024-04-15 ENCOUNTER — Other Ambulatory Visit: Payer: Self-pay | Admitting: Nurse Practitioner

## 2024-04-15 DIAGNOSIS — R062 Wheezing: Secondary | ICD-10-CM

## 2024-04-15 DIAGNOSIS — J45909 Unspecified asthma, uncomplicated: Secondary | ICD-10-CM

## 2024-04-15 DIAGNOSIS — M109 Gout, unspecified: Secondary | ICD-10-CM

## 2024-04-15 DIAGNOSIS — I10 Essential (primary) hypertension: Secondary | ICD-10-CM

## 2024-04-15 NOTE — Telephone Encounter (Signed)
 Copied from CRM [id number]. Topic: Clinical - Medication Refill >> Apr 15, 2024  3:14 PM Paige D wrote: Medication:  albuterol  (PROVENTIL ) (2.5 MG/3ML) 0.083% nebulizer solution  allopurinol  (ZYLOPRIM ) 100 MG tablet  fluticasone  (FLONASE ) 50 MCG/ACT nasal spray  hydrochlorothiazide  (HYDRODIURIL ) 25 MG tablet  pantoprazole  (PROTONIX ) 40 MG tablet  Has the patient contacted their pharmacy? Yes (Agent: If no, request that the patient contact the pharmacy for the refill. If patient does not wish to contact the pharmacy document the reason why and proceed with request.) (Agent: If yes, when and what did the pharmacy advise?)  This is the patient's preferred pharmacy:  [facility]  [address], [place], [place] Phone: [phone]   Is this the correct pharmacy for this prescription? Yes If no, delete pharmacy and type the correct one.   Has the prescription been filled recently? No  Is the patient out of the medication? No  Has the patient been seen for an appointment in the last year OR does the patient have an upcoming appointment? No  Can we respond through MyChart? No  Agent: Please be advised that Rx refills may take up to 3 business days. We ask that you follow-up with your pharmacy.

## 2024-04-19 ENCOUNTER — Telehealth: Payer: Self-pay

## 2024-04-19 NOTE — Telephone Encounter (Signed)
 Copied from CRM [id number]. Topic: Clinical - Order For Equipment >> Apr 19, 2024 11:38 AM Thersia BROCKS wrote: Reason for CRM: Clarissa from [facility] called in regarding patient wheelchair  is needing a mobility device on going request for power wheelchair, stated she will be fine to get her wheelchair replace if she isnt to get the power wheelchair soon  [phone] ext : [phone]

## 2024-04-20 NOTE — Telephone Encounter (Signed)
 Called [employer] in regards to pt wheelchair, Was transfer to 3 people in regards to it and non will give me a clear answer on where and how will the pt be approved for the wheel chair.  Called pt and made her aware of this and also made her aware she need to schedule an appointment.

## 2024-04-20 NOTE — Telephone Encounter (Signed)
 Issue address in different encounter

## 2024-04-21 DIAGNOSIS — Z741 Need for assistance with personal care: Secondary | ICD-10-CM | POA: Diagnosis not present

## 2024-04-21 DIAGNOSIS — Z7409 Other reduced mobility: Secondary | ICD-10-CM | POA: Diagnosis not present

## 2024-04-21 DIAGNOSIS — R3915 Urgency of urination: Secondary | ICD-10-CM | POA: Diagnosis not present

## 2024-04-21 MED ORDER — PANTOPRAZOLE SODIUM 40 MG PO TBEC: 40.0000 mg | DELAYED_RELEASE_TABLET | Freq: Every day | ORAL | 0 refills | Status: AC

## 2024-04-21 MED ORDER — FLUTICASONE PROPIONATE 50 MCG/ACT NA SUSP: 2.0000 | Freq: Every day | NASAL | 3 refills | Status: AC

## 2024-04-21 MED ORDER — ALBUTEROL SULFATE HFA 108 (90 BASE) MCG/ACT IN AERS: INHALATION_SPRAY | RESPIRATORY_TRACT | 1 refills | Status: AC

## 2024-04-21 MED ORDER — HYDROCHLOROTHIAZIDE 25 MG PO TABS
25.0000 mg | ORAL_TABLET | Freq: Every day | ORAL | 0 refills | Status: AC
Start: 2024-04-21 — End: ?

## 2024-04-26 ENCOUNTER — Ambulatory Visit: Admitting: Podiatry

## 2024-04-27 NOTE — Telephone Encounter (Signed)
 Copied from CRM [id number]. Topic: Clinical - Order For Equipment >> Apr 26, 2024  3:06 PM Aisha D wrote: Reason for CRM: Clarissa with [employer] is calling to check the status of the request for a wheelchair. Clarissa stated that the pt stated that her wheelchair is not working and would like for someone to call the pt back with an update.  This has already been addressed by lauraine pereyra nurse

## 2024-04-28 ENCOUNTER — Telehealth: Payer: Self-pay

## 2024-04-28 ENCOUNTER — Ambulatory Visit: Admitting: Family Medicine

## 2024-04-28 DIAGNOSIS — R2689 Other abnormalities of gait and mobility: Secondary | ICD-10-CM | POA: Insufficient documentation

## 2024-04-28 DIAGNOSIS — M6281 Muscle weakness (generalized): Secondary | ICD-10-CM | POA: Insufficient documentation

## 2024-04-28 NOTE — Telephone Encounter (Signed)
 Copied from CRM #[id number]. Topic: General - Running Late >> Apr 28, 2024  2:27 PM Viola F wrote: Patient daughter called 2:26pm regarding 2:40pm appt for patient today, I let them know that patient has 10 minute grace period until 2:50pm. Patient confirmed that they will be at the office by 2:50pm

## 2024-04-28 NOTE — Progress Notes (Deleted)
   Acute Office Visit  Subjective:     Patient ID: Amy Gallagher, female    DOB: Jul 16, 1929, 89 y.o.   MRN: [id number]  No chief complaint on file.   HPI  Discussed the use of AI scribe software for clinical note transcription with the patient, who gave verbal consent to proceed.  History of Present Illness      ROS Per HPI      Objective:    There were no vitals taken for this visit.   Physical Exam Vitals and nursing note reviewed.  Constitutional:      General: She is not in acute distress.    Appearance: Normal appearance. She is normal weight.  HENT:     Head: Normocephalic and atraumatic.     Right Ear: External ear normal.     Left Ear: External ear normal.     Nose: Nose normal.     Mouth/Throat:     Mouth: Mucous membranes are moist.     Pharynx: Oropharynx is clear.  Eyes:     Extraocular Movements: Extraocular movements intact.     Pupils: Pupils are equal, round, and reactive to light.  Cardiovascular:     Rate and Rhythm: Normal rate and regular rhythm.     Pulses: Normal pulses.     Heart sounds: Normal heart sounds.  Pulmonary:     Effort: Pulmonary effort is normal. No respiratory distress.     Breath sounds: Normal breath sounds. No wheezing, rhonchi or rales.  Musculoskeletal:        General: Normal range of motion.     Cervical back: Normal range of motion.     Right lower leg: No edema.     Left lower leg: No edema.  Lymphadenopathy:     Cervical: No cervical adenopathy.  Neurological:     General: No focal deficit present.     Mental Status: She is alert and oriented to person, place, and time.  Psychiatric:        Mood and Affect: Mood normal.        Thought Content: Thought content normal.     No results found for any visits on 04/28/24.      Assessment & Plan:   Assessment and Plan Assessment & Plan      No orders of the defined types were placed in this encounter.    No orders of the defined types were  placed in this encounter.   No follow-ups on file.  Corean LITTIE Ku, FNP

## 2024-05-02 NOTE — Progress Notes (Deleted)
   Acute Office Visit  Subjective:     Patient ID: Amy Gallagher, female    DOB: December 30, 1929, 89 y.o.   MRN: [id number]  No chief complaint on file.   HPI  Discussed the use of AI scribe software for clinical note transcription with the patient, who gave verbal consent to proceed.  History of Present Illness      ROS Per HPI      Objective:    There were no vitals taken for this visit.   Physical Exam Vitals and nursing note reviewed.  Constitutional:      General: She is not in acute distress.    Appearance: Normal appearance. She is normal weight.  HENT:     Head: Normocephalic and atraumatic.     Right Ear: External ear normal.     Left Ear: External ear normal.     Nose: Nose normal.     Mouth/Throat:     Mouth: Mucous membranes are moist.     Pharynx: Oropharynx is clear.  Eyes:     Extraocular Movements: Extraocular movements intact.     Pupils: Pupils are equal, round, and reactive to light.  Cardiovascular:     Rate and Rhythm: Normal rate and regular rhythm.     Pulses: Normal pulses.     Heart sounds: Normal heart sounds.  Pulmonary:     Effort: Pulmonary effort is normal. No respiratory distress.     Breath sounds: Normal breath sounds. No wheezing, rhonchi or rales.  Musculoskeletal:        General: Normal range of motion.     Cervical back: Normal range of motion.     Right lower leg: No edema.     Left lower leg: No edema.  Lymphadenopathy:     Cervical: No cervical adenopathy.  Neurological:     General: No focal deficit present.     Mental Status: She is alert and oriented to person, place, and time.  Psychiatric:        Mood and Affect: Mood normal.        Thought Content: Thought content normal.     No results found for any visits on 05/03/24.      Assessment & Plan:   Assessment and Plan Assessment & Plan      No orders of the defined types were placed in this encounter.    No orders of the defined types were  placed in this encounter.   No follow-ups on file.  Corean LITTIE Ku, FNP

## 2024-05-03 ENCOUNTER — Ambulatory Visit: Admitting: Family Medicine

## 2024-05-03 ENCOUNTER — Telehealth: Payer: Self-pay

## 2024-05-03 NOTE — Telephone Encounter (Signed)
 Copied from CRM #[id number]. Topic: Appointments - Scheduling Inquiry for Clinic >> May 02, 2024  4:53 PM Dedra B wrote: Reason for CRM: Pt had to cancel her appt for tomorrow, 10/21 because her grandson that normally brings her had surgery. Her niece will be coming from out of town tomorrow to live with her, so she will be able to bring her to future appts. Lauraine Boor next available is not until 06/2024, but pt wishes to be seen before then since she needs med refills and a new wheelchair. Pls call pt if she can be scheduled prior to 06/2024.

## 2024-05-11 NOTE — Telephone Encounter (Signed)
 Called pt and made her aware Lauraine do not have any opening this week or next week, she only have same day appointment. So she can call the days lauraine is here to schedule an appointment.

## 2024-05-19 ENCOUNTER — Other Ambulatory Visit (HOSPITAL_COMMUNITY): Payer: Self-pay

## 2024-05-20 DIAGNOSIS — Z7409 Other reduced mobility: Secondary | ICD-10-CM | POA: Diagnosis not present

## 2024-05-20 DIAGNOSIS — R3915 Urgency of urination: Secondary | ICD-10-CM | POA: Diagnosis not present

## 2024-05-20 DIAGNOSIS — Z741 Need for assistance with personal care: Secondary | ICD-10-CM | POA: Diagnosis not present

## 2024-05-24 ENCOUNTER — Ambulatory Visit: Admitting: Podiatry

## 2024-06-02 ENCOUNTER — Ambulatory Visit: Payer: Self-pay

## 2024-06-02 ENCOUNTER — Ambulatory Visit: Admitting: Nurse Practitioner

## 2024-06-02 VITALS — BP 160/80 | HR 63 | Temp 97.6°F | Ht 62.0 in

## 2024-06-02 DIAGNOSIS — R8290 Unspecified abnormal findings in urine: Secondary | ICD-10-CM

## 2024-06-02 DIAGNOSIS — I10 Essential (primary) hypertension: Secondary | ICD-10-CM | POA: Diagnosis not present

## 2024-06-02 DIAGNOSIS — M1990 Unspecified osteoarthritis, unspecified site: Secondary | ICD-10-CM

## 2024-06-02 LAB — CBC
HCT: 38.2 % (ref 36.0–46.0)
Hemoglobin: 12.4 g/dL (ref 12.0–15.0)
MCHC: 32.4 g/dL (ref 30.0–36.0)
MCV: 95.4 fl (ref 78.0–100.0)
Platelets: 221 K/uL (ref 150.0–400.0)
RBC: 4 Mil/uL (ref 3.87–5.11)
RDW: 14.2 % (ref 11.5–15.5)
WBC: 4 K/uL (ref 4.0–10.5)

## 2024-06-02 LAB — COMPREHENSIVE METABOLIC PANEL WITH GFR
ALT: 12 U/L (ref 0–35)
AST: 21 U/L (ref 0–37)
Albumin: 4.1 g/dL (ref 3.5–5.2)
Alkaline Phosphatase: 47 U/L (ref 39–117)
BUN: 11 mg/dL (ref 6–23)
CO2: 34 meq/L — ABNORMAL HIGH (ref 19–32)
Calcium: 9.3 mg/dL (ref 8.4–10.5)
Chloride: 103 meq/L (ref 96–112)
Creatinine, Ser: 0.87 mg/dL (ref 0.40–1.20)
GFR: 57.77 mL/min — ABNORMAL LOW (ref >60.00)
Glucose, Bld: 99 mg/dL (ref 70–99)
Potassium: 4.9 meq/L (ref 3.5–5.1)
Sodium: 140 meq/L (ref 135–145)
Total Bilirubin: 0.3 mg/dL (ref 0.2–1.2)
Total Protein: 7.4 g/dL (ref 6.0–8.3)

## 2024-06-02 NOTE — Assessment & Plan Note (Signed)
 Essential hypertension Blood pressure elevated. Not taking hydrochlorothiazide  as prescirbed regularly. No chest pain or dyspnea. - Rechecked blood pressure. - Encouraged adherence to hydrochlorothiazide  25 mg daily.

## 2024-06-02 NOTE — Telephone Encounter (Signed)
 FYI Only or Action Required?: FYI only for provider: appointment scheduled on 06/02/24.  Patient was last seen in primary care on 07/16/2023 by Elnor Lauraine BRAVO, NP.  Called Nurse Triage reporting Urinary Frequency.  Symptoms began about a month ago.  Interventions attempted: Nothing.  Symptoms are: stable.  Triage Disposition: Information or Advice Only Call  Patient/caregiver understands and will follow disposition?: Yes Reason for Disposition  Health information question, no triage required and triager able to answer question  Answer Assessment - Initial Assessment Questions 1. REASON FOR CALL: What is the main reason for your call? or How can I best help you?     Patient's daughter calling in to confirm appointment for 4pm today, patient does not have appointment. She wants patient seen today for frequent urination, been a while since she's seen doctor and needs to get in to see one. States she needs bloodwork done, talk about a new wheelchair.  2. SYMPTOMS : Do you have any symptoms?      Frequent urination  Protocols used: Information Only Call - No Triage-A-AH  Copied from CRM [id number]. Topic: Clinical - Red Word Triage >> Jun 02, 2024 10:30 AM Suzen RAMAN wrote: Red Word that prompted transfer to Nurse Triage:burning with urination and heaviness in stomach. Requesting an appt for today. Initially call to confirm appt that was suppose to be schedule today at 4:00 pm no evidence of an appt currently scheduled or previously scheduled for today.

## 2024-06-02 NOTE — Progress Notes (Signed)
 Established Patient Office Visit  Subjective   Patient ID: Amy Gallagher, female    DOB: 12/20/1929  Age: 89 y.o. MRN: [id number]  Chief Complaint  Patient presents with   Arthritis    Discussed the use of AI scribe software for clinical note transcription with the patient, who gave verbal consent to proceed.  History of Present Illness Amy Gallagher is a 89 year old female who presents with concerns of a possible urinary tract infection and issues with her wheelchair. She is accompanied by her grandson.  Urinary symptoms - Slight odor in urine for approximately one month - No dysuria, hematuria, fever, nausea, or vomiting - No frequent urinary tract infections  Arthralgia and mobility impairment - Arthritis affecting knees and hands - Tylenol  provides some pain relief but is insufficient - Does not tolerate NSAIDs due to nausea  Wheelchair and home accessibility issues - Current manual wheelchair is unsuitable, causing discomfort and posing a fall risk due to inadequate brakes - Unable to self-propel wheelchair with hands due to pain in hands - Seeking a powered wheelchair - Requires a new toilet stool and shower chair for improved home accessibility      Review of Systems  Genitourinary:  Negative for dysuria and hematuria.      Objective:     BP (!) 160/80   Pulse 63   Temp 97.6 F (36.4 C) (Temporal)   Ht 5' 2 (1.575 m)   SpO2 99%   BMI 32.74 kg/m  BP Readings from Last 3 Encounters:  06/02/24 (!) 160/80  07/16/23 (!) 154/82  12/05/22 (!) 156/82   Wt Readings from Last 3 Encounters:  12/22/23 179 lb (81.2 kg)  02/12/22 179 lb (81.2 kg)  02/27/20 185 lb (83.9 kg)      Physical Exam Vitals reviewed.  Constitutional:      General: She is not in acute distress.    Appearance: Normal appearance.  HENT:     Head: Normocephalic and atraumatic.  Neck:     Vascular: No carotid bruit.  Cardiovascular:     Rate and Rhythm: Normal rate and  regular rhythm.     Pulses: Normal pulses.     Heart sounds: Normal heart sounds.  Pulmonary:     Effort: Pulmonary effort is normal.     Breath sounds: Normal breath sounds.  Skin:    General: Skin is warm and dry.  Neurological:     Mental Status: She is alert and oriented to person, place, and time.  Psychiatric:        Mood and Affect: Mood normal.        Behavior: Behavior normal.        Judgment: Judgment normal.      No results found for any visits on 06/02/24.    The ASCVD Risk score (Arnett DK, et al., 2019) failed to calculate for the following reasons:   The 2019 ASCVD risk score is only valid for ages 80 to 63    Assessment & Plan:   Problem List Items Addressed This Visit       Musculoskeletal and Integument   Arthritis   Relevant Orders   DME Wheelchair manual   DME Bedside commode   For home use only DME Other see comment   Ambulatory referral to Physical Therapy   Other Visit Diagnoses       Abnormal urine odor    -  Primary   Relevant Orders   Urinalysis, Routine w reflex  microscopic   Urine Culture   Comprehensive metabolic panel with GFR   CBC      Assessment and Plan Assessment & Plan Possible urinary tract infection with abnormal urine odor Reports abnormal urine odor for a month. No dysuria, hematuria, fever, nausea, or vomiting. Urinary tract infections are infrequent. - Ordered urinalysis and urine culture. - Advised to drop off urine sample at lab as patient reports she is unable to provide sample today - Will await results before initiating antibiotics.  Osteoarthritis of knees and hands Chronic osteoarthritis in knees and hands. Pain managed by Tylenol . NSAIDs caused nausea. - Continue Tylenol  for pain management.  Essential hypertension Blood pressure elevated. Not taking hydrochlorothiazide  as prescirbed regularly. No chest pain or dyspnea. - Rechecked blood pressure. - Encouraged adherence to hydrochlorothiazide  25 mg  daily.  Limited mobility requiring wheelchair and assistive devices Current wheelchair inadequate and poses fall hazard. Needs new toilet stool and shower chair. - [facility]erred to [facility] for power wheelchair evaluation. - Provided prescription for a manual wheelchair that she can try to use in the home while waiting for evaluation for powered wheelchair - Provided prescription for a new toilet stool. - Provided prescription for a shower chair.    Return in about 3 months (around 09/02/2024) for F/U with Lauraine.    [name], NP

## 2024-06-02 NOTE — Assessment & Plan Note (Signed)
 Osteoarthritis of knees and hands Chronic osteoarthritis in knees and hands. Pain managed by Tylenol . NSAIDs caused nausea. - Continue Tylenol  for pain management.  Limited mobility requiring wheelchair and assistive devices Current wheelchair inadequate and poses fall hazard. Needs new toilet stool and shower chair. - Referred to [facility] for power wheelchair evaluation. - Provided prescription for a manual wheelchair that she can try to use in the home while waiting for evaluation for powered wheelchair - Provided prescription for a new toilet stool. - Provided prescription for a shower chair.

## 2024-06-02 NOTE — Assessment & Plan Note (Signed)
 Possible urinary tract infection with abnormal urine odor Reports abnormal urine odor for a month. No dysuria, hematuria, fever, nausea, or vomiting. Urinary tract infections are infrequent. - Ordered urinalysis and urine culture. - Advised to drop off urine sample at lab as patient reports she is unable to provide sample today - Will await results before initiating antibiotics.

## 2024-06-03 LAB — URINALYSIS, ROUTINE W REFLEX MICROSCOPIC
Bilirubin Urine: NEGATIVE
Hgb urine dipstick: NEGATIVE
Ketones, ur: NEGATIVE
Nitrite: NEGATIVE
Specific Gravity, Urine: 1.01 (ref 1.000–1.030)
Total Protein, Urine: NEGATIVE
Urine Glucose: NEGATIVE
Urobilinogen, UA: 0.2 (ref 0.0–1.0)
pH: 7.5 (ref 5.0–8.0)

## 2024-06-05 LAB — URINE CULTURE

## 2024-06-06 ENCOUNTER — Ambulatory Visit: Payer: Self-pay | Admitting: Nurse Practitioner

## 2024-06-06 ENCOUNTER — Other Ambulatory Visit: Payer: Self-pay | Admitting: Nurse Practitioner

## 2024-06-06 DIAGNOSIS — N3000 Acute cystitis without hematuria: Secondary | ICD-10-CM

## 2024-06-06 MED ORDER — SULFAMETHOXAZOLE-TRIMETHOPRIM 800-160 MG PO TABS: 1.0000 | ORAL_TABLET | Freq: Two times a day (BID) | ORAL | 0 refills | Status: AC

## 2024-06-06 NOTE — Progress Notes (Signed)
 Estimated Creatinine Clearance: 40.7 mL/min (by C-G formula based on SCr of 0.87 mg/dL).

## 2024-06-08 ENCOUNTER — Telehealth: Payer: Self-pay | Admitting: Nurse Practitioner

## 2024-06-08 NOTE — Telephone Encounter (Signed)
 Pt's Daughter dropped off a form that needs to be filled out by Lauraine Pereyra, NP and I have place them in the provider's box @ the [employer].  Please call the pt when they are ready to be picked up.  Thanks!   Rossana

## 2024-06-16 DIAGNOSIS — R3915 Urgency of urination: Secondary | ICD-10-CM | POA: Diagnosis not present

## 2024-06-16 DIAGNOSIS — Z7409 Other reduced mobility: Secondary | ICD-10-CM | POA: Diagnosis not present

## 2024-06-16 DIAGNOSIS — Z741 Need for assistance with personal care: Secondary | ICD-10-CM | POA: Diagnosis not present

## 2024-06-17 NOTE — Telephone Encounter (Signed)
Forms given to provider.

## 2024-06-20 NOTE — Telephone Encounter (Signed)
 Copied from CRM [id number]. Topic: General - Other >> Jun 20, 2024  9:38 AM Deaijah H wrote: Reason for CRM: Patient called in wanting to know if Dr. Elnor sent the letter to [facility] [employer]. Stated if not her lights will be cut off and needs to be sent before EOD. Please call [phone]

## 2024-06-20 NOTE — Telephone Encounter (Unsigned)
 Copied from CRM [id number]. Topic: General - Other >> Jun 20, 2024 12:59 PM Wess RAMAN wrote: Reason for CRM:  Patient would like to speak with Elnor Domino, NP or her nurse in regards to her letter being sent to [facility] [employer] or her lights will be cut off.   Callback #: [phone]

## 2024-06-23 NOTE — Telephone Encounter (Signed)
 Forms has been completed and mailed

## 2024-07-11 ENCOUNTER — Other Ambulatory Visit (HOSPITAL_COMMUNITY): Payer: Self-pay

## 2024-07-22 ENCOUNTER — Telehealth: Payer: Self-pay

## 2024-07-22 NOTE — Telephone Encounter (Signed)
 Copied from CRM #[id number]. Topic: General - Other >> Jul 22, 2024  4:12 PM Thersia BROCKS wrote: Reason for CRM: Patient called in regarding needing to speak with NP Lauraine Pereyra or nurse, regarding needing information regarding the letter being sent to [facility] [employer]

## 2024-07-28 ENCOUNTER — Telehealth: Payer: Self-pay

## 2024-07-28 NOTE — Telephone Encounter (Signed)
 Copied from CRM #[id number]. Topic: Referral - Status >> Jul 28, 2024 11:21 AM Jayma L wrote: Reason for CRM: patient called asking for a update on her wheel chair said she has a wheel chair but its so large and she doesn't have anyone to push her around in it. Patient is asking for a power wheel chair, said she's already asked for this so we just need a status update.

## 2024-07-29 NOTE — Telephone Encounter (Signed)
 Made pt aware of forms being send to [facility]

## 2024-07-29 NOTE — Telephone Encounter (Signed)
 Called pt and made them aware that forms for the wheelchair was fa, pt stated she did receive a wheelchair but it is too big and need electronic wheelchair. Told pt to call insurance and see if they will approve it as we have send an order last year for a power wheelchair. Pt stated she will.

## 2024-12-26 ENCOUNTER — Ambulatory Visit
